# Patient Record
Sex: Female | Born: 1960 | Race: White | Hispanic: No | Marital: Single | Smoking: Former smoker
Health system: Southern US, Community
[De-identification: ages and names within clinical notes are randomized; demographics above are authoritative.]

## PROBLEM LIST (undated history)

## (undated) DIAGNOSIS — I5181 Takotsubo syndrome: Secondary | ICD-10-CM

## (undated) DIAGNOSIS — E119 Type 2 diabetes mellitus without complications: Secondary | ICD-10-CM

## (undated) DIAGNOSIS — F988 Other specified behavioral and emotional disorders with onset usually occurring in childhood and adolescence: Secondary | ICD-10-CM

## (undated) DIAGNOSIS — R0600 Dyspnea, unspecified: Secondary | ICD-10-CM

## (undated) DIAGNOSIS — F419 Anxiety disorder, unspecified: Secondary | ICD-10-CM

## (undated) DIAGNOSIS — R569 Unspecified convulsions: Secondary | ICD-10-CM

## (undated) DIAGNOSIS — M1990 Unspecified osteoarthritis, unspecified site: Secondary | ICD-10-CM

## (undated) DIAGNOSIS — F329 Major depressive disorder, single episode, unspecified: Secondary | ICD-10-CM

## (undated) DIAGNOSIS — J45909 Unspecified asthma, uncomplicated: Secondary | ICD-10-CM

## (undated) DIAGNOSIS — M810 Age-related osteoporosis without current pathological fracture: Secondary | ICD-10-CM

## (undated) DIAGNOSIS — G4730 Sleep apnea, unspecified: Secondary | ICD-10-CM

## (undated) DIAGNOSIS — J449 Chronic obstructive pulmonary disease, unspecified: Secondary | ICD-10-CM

## (undated) DIAGNOSIS — K219 Gastro-esophageal reflux disease without esophagitis: Secondary | ICD-10-CM

## (undated) DIAGNOSIS — N200 Calculus of kidney: Secondary | ICD-10-CM

## (undated) DIAGNOSIS — Z87442 Personal history of urinary calculi: Secondary | ICD-10-CM

## (undated) DIAGNOSIS — I10 Essential (primary) hypertension: Secondary | ICD-10-CM

## (undated) HISTORY — DX: Takotsubo syndrome: I51.81

## (undated) HISTORY — PX: KIDNEY STONE SURGERY: SHX686

## (undated) HISTORY — DX: Calculus of kidney: N20.0

## (undated) HISTORY — PX: FLEXOR TENDON REPAIR: SHX1651

---

## 2016-11-05 ENCOUNTER — Encounter: Payer: Self-pay | Admitting: Emergency Medicine

## 2016-11-05 ENCOUNTER — Emergency Department: Payer: Medicaid Other

## 2016-11-05 ENCOUNTER — Observation Stay
Admission: EM | Admit: 2016-11-05 | Discharge: 2016-11-07 | Disposition: A | Discharge status: Home / Self Care | Payer: Medicaid Other | Attending: Urology | Admitting: Urology

## 2016-11-05 DIAGNOSIS — N132 Hydronephrosis with renal and ureteral calculous obstruction: Secondary | ICD-10-CM | POA: Diagnosis not present

## 2016-11-05 DIAGNOSIS — F17210 Nicotine dependence, cigarettes, uncomplicated: Secondary | ICD-10-CM | POA: Diagnosis not present

## 2016-11-05 DIAGNOSIS — Z23 Encounter for immunization: Secondary | ICD-10-CM | POA: Insufficient documentation

## 2016-11-05 DIAGNOSIS — I700 Atherosclerosis of aorta: Secondary | ICD-10-CM | POA: Insufficient documentation

## 2016-11-05 DIAGNOSIS — N12 Tubulo-interstitial nephritis, not specified as acute or chronic: Secondary | ICD-10-CM

## 2016-11-05 DIAGNOSIS — J45909 Unspecified asthma, uncomplicated: Secondary | ICD-10-CM | POA: Insufficient documentation

## 2016-11-05 DIAGNOSIS — K219 Gastro-esophageal reflux disease without esophagitis: Secondary | ICD-10-CM | POA: Insufficient documentation

## 2016-11-05 DIAGNOSIS — Z7984 Long term (current) use of oral hypoglycemic drugs: Secondary | ICD-10-CM | POA: Diagnosis not present

## 2016-11-05 DIAGNOSIS — E119 Type 2 diabetes mellitus without complications: Secondary | ICD-10-CM | POA: Diagnosis not present

## 2016-11-05 DIAGNOSIS — F329 Major depressive disorder, single episode, unspecified: Secondary | ICD-10-CM | POA: Insufficient documentation

## 2016-11-05 DIAGNOSIS — K5730 Diverticulosis of large intestine without perforation or abscess without bleeding: Secondary | ICD-10-CM | POA: Diagnosis not present

## 2016-11-05 DIAGNOSIS — R569 Unspecified convulsions: Secondary | ICD-10-CM | POA: Diagnosis not present

## 2016-11-05 DIAGNOSIS — N200 Calculus of kidney: Secondary | ICD-10-CM

## 2016-11-05 DIAGNOSIS — N201 Calculus of ureter: Secondary | ICD-10-CM | POA: Diagnosis present

## 2016-11-05 HISTORY — DX: Major depressive disorder, single episode, unspecified: F32.9

## 2016-11-05 HISTORY — DX: Unspecified asthma, uncomplicated: J45.909

## 2016-11-05 HISTORY — DX: Unspecified convulsions: R56.9

## 2016-11-05 HISTORY — DX: Type 2 diabetes mellitus without complications: E11.9

## 2016-11-05 HISTORY — DX: Gastro-esophageal reflux disease without esophagitis: K21.9

## 2016-11-05 MED ORDER — MORPHINE SULFATE (PF) 4 MG/ML IV SOLN
INTRAVENOUS | Status: AC
  Administered 2016-11-05: 4 mg
  Filled 2016-11-05: qty 1

## 2016-11-05 MED ORDER — ONDANSETRON HCL 4 MG/2ML IJ SOLN
INTRAMUSCULAR | Status: AC
  Administered 2016-11-05: 4 mg
  Filled 2016-11-05: qty 2

## 2016-11-05 NOTE — ED Provider Notes (Signed)
[facility] Emergency Department Provider Note _   First MD Initiated Contact with Patient 11/05/16 2326     (approximate)  I have reviewed the triage vital signs and the nursing notes.   HISTORY  Chief Complaint Flank Pain (Pt. here via EMS from home for rt. sided flank pain.)    HPI Savannah Pena is a 55 y.o. female with history of kidney stones presents to the emergency department acute onset of right flank pain yesterday that she states has been persistent patient says her current pain score is 10 out of 10. Patient states the pain is coming by nausea however no vomiting. Patient admits to feeling febrile at home however afebrile here 98 8.  Past medical history Kidney stones Patient Active Problem List   Diagnosis Date Noted  . Ureteral stone 11/06/2016    Past surgical history None  Prior to Admission medications   Not on File    Allergies Patient has no known allergies.  History reviewed. No pertinent family history.  Social History Social History  Substance Use Topics  . Smoking status: Current Every Day Smoker    Types: Cigarettes  . Smokeless tobacco: Current User  . Alcohol use Yes    Review of Systems Constitutional: No fever/chills Eyes: No visual changes. ENT: No sore throat. Cardiovascular: Denies chest pain. Respiratory: Denies shortness of breath. Gastrointestinal: No abdominal pain.  No nausea, no vomiting.  No diarrhea.  No constipation. Genitourinary: Negative for dysuria. Musculoskeletal: Negative for back pain. Skin: Negative for rash. Neurological: Negative for headaches, focal weakness or numbness.  10-point ROS otherwise negative.  ____________________________________________   PHYSICAL EXAM:  VITAL SIGNS: ED Triage Vitals  Enc Vitals Group     BP --      Pulse Rate 11/05/16 2328 (!) 108     Resp --      Temp --      Temp src --      SpO2 11/05/16 2326 97 %     Weight --      Height --    Head Circumference --      Peak Flow --      Pain Score --      Pain Loc --      Pain Edu? --      Excl. in GC? --     Constitutional: Alert and oriented. Apparent discomfort  Eyes: Conjunctivae are normal. PERRL. EOMI. Head: Atraumatic. Ears:  Healthy appearing ear canals and TMs bilaterally Nose: No congestion/rhinnorhea. Mouth/Throat: Mucous membranes are moist.  Oropharynx non-erythematous. Neck: No stridor.   Cardiovascular: Normal rate, regular rhythm. Good peripheral circulation. Grossly normal heart sounds. Respiratory: Normal respiratory effort.  No retractions. Lungs CTAB. Gastrointestinal: Soft and nontender. No distention.  Genitourinary: Right CVA tenderness with percussion Musculoskeletal: No lower extremity tenderness nor edema. No gross deformities of extremities. Neurologic:  Normal speech and language. No gross focal neurologic deficits are appreciated.  Skin:  Skin is warm, dry and intact. No rash noted. Psychiatric: Mood and affect are normal. Speech and behavior are normal.  ____________________________________________   LABS (all labs ordered are listed, but only abnormal results are displayed)  Labs Reviewed  CBC - Abnormal; Notable for the following:       Result Value   WBC 28.3 (*)    All other components within normal limits  COMPREHENSIVE METABOLIC PANEL - Abnormal; Notable for the following:    Glucose, Bld 159 (*)    BUN 25 (*)  All other components within normal limits  URINE CULTURE  CULTURE, BLOOD (ROUTINE X 2)  CULTURE, BLOOD (ROUTINE X 2)  CREATININE, SERUM  URINALYSIS, COMPLETE (UACMP) WITH MICROSCOPIC  LACTIC ACID, PLASMA  LACTIC ACID, PLASMA  HEMOGLOBIN A1C  CBC    RADIOLOGY I, [place] N BROWN, personally viewed and evaluated these images (plain radiographs) as part of my medical decision making, as well as reviewing the written report by the radiologist.  Ct Renal Stone Study  Result Date: 11/06/2016 CLINICAL DATA:   Acute onset of right flank pain and nausea. Initial encounter. EXAM: CT ABDOMEN AND PELVIS WITHOUT CONTRAST TECHNIQUE: Multidetector CT imaging of the abdomen and pelvis was performed following the standard protocol without IV contrast. COMPARISON:  None. FINDINGS: Lower chest: The visualized lung bases are grossly clear. The visualized portions of the mediastinum are unremarkable. Hepatobiliary: The liver is unremarkable in appearance. The gallbladder is unremarkable in appearance. The common bile duct remains normal in caliber. Pancreas: The pancreas is within normal limits. Spleen: The spleen is unremarkable in appearance. Adrenals/Urinary Tract: The adrenal glands are unremarkable in appearance. Moderate right-sided hydronephrosis is noted, with right-sided perinephric stranding and fluid. An obstructing 1.0 x 0.5 cm stone is noted at the mid right ureter, 8 cm below the right renal pelvis. A small nonobstructing 5 mm stone is suggested at the interpole region of the right kidney. Stomach/Bowel: The stomach is unremarkable in appearance. The small bowel is within normal limits. The appendix is not visualized; there is no evidence for appendicitis. Mild scattered diverticulosis is noted along the entirety of the colon, without evidence of diverticulitis. Vascular/Lymphatic: Scattered calcification is seen along the abdominal aorta and its branches. The abdominal aorta is otherwise grossly unremarkable. The inferior vena cava is grossly unremarkable. No retroperitoneal lymphadenopathy is seen. No pelvic sidewall lymphadenopathy is identified. Reproductive: The bladder is mildly distended and within normal limits. The uterus is grossly unremarkable in appearance. The ovaries are relatively symmetric. No suspicious adnexal masses are seen. Other: No additional soft tissue abnormalities are seen. Musculoskeletal: No acute osseous abnormalities are identified. The visualized musculature is unremarkable in appearance.  IMPRESSION: 1. Moderate right-sided hydronephrosis, with right-sided perinephric stranding and fluid. Obstructing 1.0 x 0.5 cm stone at the mid right ureter, 8 cm below the right renal pelvis. 2. Small nonobstructing 5 mm stone suggested at the interpole region of the right kidney. 3. Mild scattered diverticulosis along the entirety of the colon, without evidence of diverticulitis. 4. Scattered aortic atherosclerosis. Electronically Signed   By: Roanna Raider M.D.   On: 11/06/2016 00:13     Procedures   Critical Care performed:CRITICAL CARE Performed by: Darci Current   Total critical care time: 30 minutes  Critical care time was exclusive of separately billable procedures and treating other patients.  Critical care was necessary to treat or prevent imminent or life-threatening deterioration.  Critical care was time spent personally by me on the following activities: development of treatment plan with patient and/or surrogate as well as nursing, discussions with consultants, evaluation of patient's response to treatment, examination of patient, obtaining history from patient or surrogate, ordering and performing treatments and interventions, ordering and review of laboratory studies, ordering and review of radiographic studies, pulse oximetry and re-evaluation of patient's condition.  ____________________________________________   INITIAL IMPRESSION / ASSESSMENT AND PLAN / ED COURSE  Pertinent labs & imaging results that were available during my care of the patient were reviewed by me and considered in my medical decision making (see  chart for details).  Patient given IV morphine and Zofran on initial evaluation. History of physical exam concerning for possible ureterolithiasis which was confirmed by CT scan. In addition concern for possible pyelonephritis concomitant ureterolithiasis. Patient's WBC 28 patient with reported fever at home tachycardic on arrival as such patient was given  IV ceftriaxone 1 g. Patient discussed with Dr. Wilson SingerWren urologist on call with plan for urethral stent. Patient also discussed with Dr. Sheryle Haildiamond hospitalist for admission following procedure. Clinical Course     ____________________________________________  FINAL CLINICAL IMPRESSION(S) / ED DIAGNOSES  Final diagnoses:  None     MEDICATIONS GIVEN DURING THIS VISIT:  Medications  insulin aspart (novoLOG) injection 0-15 Units (not administered)  enoxaparin (LOVENOX) injection 40 mg (not administered)  0.45 % NaCl with KCl 20 mEq / L infusion ( Intravenous New Bag/Given 11/06/16 0433)  cefTRIAXone (ROCEPHIN) 1 g in dextrose 5 % 50 mL IVPB (not administered)  acetaminophen (TYLENOL) tablet 650 mg (not administered)  oxyCODONE (Oxy IR/ROXICODONE) immediate release tablet 5 mg (not administered)  HYDROmorphone (DILAUDID) injection 0.5-1 mg (not administered)  oxybutynin (DITROPAN) tablet 5 mg (not administered)  zolpidem (AMBIEN) tablet 5 mg (not administered)  diphenhydrAMINE (BENADRYL) injection 12.5-25 mg (not administered)    Or  diphenhydrAMINE (BENADRYL) 12.5 MG/5ML elixir 12.5-25 mg (not administered)  senna-docusate (Senokot-S) tablet 1 tablet (not administered)  bisacodyl (DULCOLAX) suppository 10 mg (not administered)  sodium phosphate (FLEET) 7-19 GM/118ML enema 1 enema (not administered)  ondansetron (ZOFRAN) injection 4 mg (not administered)  pneumococcal 23 valent vaccine (PNU-IMMUNE) injection 0.5 mL (not administered)  ondansetron (ZOFRAN) 4 MG/2ML injection (4 mg  Given 11/05/16 2342)  morphine 4 MG/ML injection (4 mg  Given 11/05/16 2342)  morphine 4 MG/ML injection 4 mg (4 mg Intravenous Given 11/06/16 0035)  cefTRIAXone (ROCEPHIN) IVPB 1 g (1 g Intravenous Given 11/06/16 0220)  sodium chloride 0.9 % bolus 1,000 mL (1,000 mLs Intravenous Transfusing/Transfer 11/06/16 0145)     NEW OUTPATIENT MEDICATIONS STARTED DURING THIS VISIT:  There are no discharge medications  for this patient.   There are no discharge medications for this patient.   There are no discharge medications for this patient.    Note:  This document was prepared using Dragon voice recognition software and may include unintentional dictation errors.    Darci Currentandolph N Brown, MD 11/06/16 [phone]

## 2016-11-05 NOTE — ED Triage Notes (Signed)
Pt. States rt. Sided flank pain that started last night.

## 2016-11-05 NOTE — ED Notes (Signed)
Patient transported to CT 

## 2016-11-06 ENCOUNTER — Encounter
Admission: EM | Disposition: A | Discharge status: Home / Self Care | Payer: Self-pay | Source: Home / Self Care | Attending: Emergency Medicine

## 2016-11-06 ENCOUNTER — Encounter: Payer: Self-pay | Admitting: Anesthesiology

## 2016-11-06 ENCOUNTER — Emergency Department: Payer: Medicaid Other | Admitting: Anesthesiology

## 2016-11-06 DIAGNOSIS — N201 Calculus of ureter: Secondary | ICD-10-CM | POA: Diagnosis not present

## 2016-11-06 DIAGNOSIS — A419 Sepsis, unspecified organism: Secondary | ICD-10-CM | POA: Diagnosis not present

## 2016-11-06 HISTORY — PX: CYSTOSCOPY WITH STENT PLACEMENT: SHX5790

## 2016-11-06 LAB — CBC
HCT: 41.2 % (ref 35.0–47.0)
HEMATOCRIT: 41.8 % (ref 35.0–47.0)
HEMOGLOBIN: 14.5 g/dL (ref 12.0–16.0)
Hemoglobin: 13.9 g/dL (ref 12.0–16.0)
MCH: 30.7 pg (ref 26.0–34.0)
MCH: 30.9 pg (ref 26.0–34.0)
MCHC: 33.8 g/dL (ref 32.0–36.0)
MCHC: 34.6 g/dL (ref 32.0–36.0)
MCV: 89.2 fL (ref 80.0–100.0)
MCV: 90.9 fL (ref 80.0–100.0)
PLATELETS: 226 10*3/uL (ref 150–440)
Platelets: 222 10*3/uL (ref 150–440)
RBC: 4.53 MIL/uL (ref 3.80–5.20)
RBC: 4.68 MIL/uL (ref 3.80–5.20)
RDW: 13 % (ref 11.5–14.5)
RDW: 13.2 % (ref 11.5–14.5)
WBC: 28.3 10*3/uL — ABNORMAL HIGH (ref 3.6–11.0)
WBC: 28.5 10*3/uL — ABNORMAL HIGH (ref 3.6–11.0)

## 2016-11-06 LAB — CREATININE, SERUM
Creatinine, Ser: 0.78 mg/dL (ref 0.44–1.00)
GFR calc Af Amer: >60 mL/min (ref >60)
GFR calc non Af Amer: >60 mL/min (ref >60)

## 2016-11-06 LAB — URINALYSIS, COMPLETE (UACMP) WITH MICROSCOPIC
BACTERIA UA: NONE SEEN
Bilirubin Urine: NEGATIVE
GLUCOSE, UA: NEGATIVE mg/dL
Ketones, ur: NEGATIVE mg/dL
NITRITE: POSITIVE — AB
PROTEIN: 100 mg/dL — AB
SPECIFIC GRAVITY, URINE: 1.012 (ref 1.005–1.030)
pH: 6 (ref 5.0–8.0)

## 2016-11-06 LAB — COMPREHENSIVE METABOLIC PANEL
ALK PHOS: 95 U/L (ref 38–126)
ALT: 26 U/L (ref 14–54)
AST: 26 U/L (ref 15–41)
Albumin: 3.9 g/dL (ref 3.5–5.0)
Anion gap: 11 (ref 5–15)
BILIRUBIN TOTAL: 0.7 mg/dL (ref 0.3–1.2)
BUN: 25 mg/dL — ABNORMAL HIGH (ref 6–20)
CALCIUM: 8.9 mg/dL (ref 8.9–10.3)
CO2: 22 mmol/L (ref 22–32)
CREATININE: 0.96 mg/dL (ref 0.44–1.00)
Chloride: 105 mmol/L (ref 101–111)
GFR calc non Af Amer: >60 mL/min (ref >60)
Glucose, Bld: 159 mg/dL — ABNORMAL HIGH (ref 65–99)
Potassium: 3.8 mmol/L (ref 3.5–5.1)
SODIUM: 138 mmol/L (ref 135–145)
TOTAL PROTEIN: 6.9 g/dL (ref 6.5–8.1)

## 2016-11-06 LAB — LACTIC ACID, PLASMA
Lactic Acid, Venous: 2.6 mmol/L (ref 0.5–1.9)
Lactic Acid, Venous: 1.6 mmol/L (ref 0.5–1.9)

## 2016-11-06 LAB — GLUCOSE, CAPILLARY
GLUCOSE-CAPILLARY: 120 mg/dL — AB (ref 65–99)
GLUCOSE-CAPILLARY: 117 mg/dL — AB (ref 65–99)
Glucose-Capillary: 192 mg/dL — ABNORMAL HIGH (ref 65–99)
Glucose-Capillary: 123 mg/dL — ABNORMAL HIGH (ref 65–99)

## 2016-11-06 SURGERY — CYSTOSCOPY, WITH STENT INSERTION
Anesthesia: Choice | Laterality: Right

## 2016-11-06 SURGERY — CYSTOSCOPY, WITH STENT INSERTION
Anesthesia: General | Site: Ureter | Laterality: Right | Wound class: Clean Contaminated

## 2016-11-06 MED ORDER — FENTANYL CITRATE (PF) 100 MCG/2ML IJ SOLN
INTRAMUSCULAR | Status: DC | PRN
  Administered 2016-11-06 (×2): 50 ug via INTRAVENOUS

## 2016-11-06 MED ORDER — DEXTROSE 5 % IV SOLN
1.0000 g | INTRAVENOUS | Status: DC
  Administered 2016-11-06 – 2016-11-07 (×2): 1 g via INTRAVENOUS
  Filled 2016-11-06 (×2): qty 10

## 2016-11-06 MED ORDER — CEFTRIAXONE SODIUM-DEXTROSE 1-3.74 GM-% IV SOLR
1.0000 g | Freq: Once | INTRAVENOUS | Status: AC
  Administered 2016-11-06: 1 g via INTRAVENOUS
  Filled 2016-11-06: qty 50

## 2016-11-06 MED ORDER — PNEUMOCOCCAL VAC POLYVALENT 25 MCG/0.5ML IJ INJ
0.5000 mL | INJECTION | INTRAMUSCULAR | Status: AC
  Administered 2016-11-07: 0.5 mL via INTRAMUSCULAR
  Filled 2016-11-06: qty 0.5

## 2016-11-06 MED ORDER — LISINOPRIL 20 MG PO TABS
20.0000 mg | ORAL_TABLET | Freq: Every day | ORAL | Status: DC
  Administered 2016-11-06 – 2016-11-07 (×2): 20 mg via ORAL
  Filled 2016-11-06 (×2): qty 1

## 2016-11-06 MED ORDER — PHENYTOIN SODIUM EXTENDED 100 MG PO CAPS
300.0000 mg | ORAL_CAPSULE | Freq: Every day | ORAL | Status: DC
  Administered 2016-11-06 – 2016-11-07 (×2): 300 mg via ORAL
  Filled 2016-11-06 (×2): qty 3

## 2016-11-06 MED ORDER — ONDANSETRON HCL 4 MG/2ML IJ SOLN: 4.0000 mg | INTRAMUSCULAR | Status: DC | PRN

## 2016-11-06 MED ORDER — SUCCINYLCHOLINE CHLORIDE 20 MG/ML IJ SOLN
INTRAMUSCULAR | Status: DC | PRN
  Administered 2016-11-06: 100 mg via INTRAVENOUS

## 2016-11-06 MED ORDER — PROPOFOL 10 MG/ML IV BOLUS
INTRAVENOUS | Status: DC | PRN
  Administered 2016-11-06: 170 mg via INTRAVENOUS

## 2016-11-06 MED ORDER — SODIUM CHLORIDE 0.9 % IV BOLUS (SEPSIS)
1000.0000 mL | Freq: Once | INTRAVENOUS | Status: AC
  Administered 2016-11-06: 1000 mL via INTRAVENOUS

## 2016-11-06 MED ORDER — NAPROXEN 500 MG PO TABS
500.0000 mg | ORAL_TABLET | Freq: Two times a day (BID) | ORAL | Status: DC
  Administered 2016-11-06 – 2016-11-07 (×2): 500 mg via ORAL
  Filled 2016-11-06 (×2): qty 1

## 2016-11-06 MED ORDER — FLUOXETINE HCL 20 MG PO CAPS
20.0000 mg | ORAL_CAPSULE | Freq: Every day | ORAL | Status: DC
  Administered 2016-11-06 – 2016-11-07 (×2): 20 mg via ORAL
  Filled 2016-11-06 (×2): qty 1

## 2016-11-06 MED ORDER — LORATADINE 10 MG PO TABS
10.0000 mg | ORAL_TABLET | Freq: Every day | ORAL | Status: DC
  Administered 2016-11-07: 10 mg via ORAL
  Filled 2016-11-06: qty 1

## 2016-11-06 MED ORDER — FLEET ENEMA 7-19 GM/118ML RE ENEM: 1.0000 | ENEMA | Freq: Once | RECTAL | Status: DC | PRN

## 2016-11-06 MED ORDER — ACETAMINOPHEN 325 MG PO TABS
650.0000 mg | ORAL_TABLET | ORAL | Status: DC | PRN
  Administered 2016-11-06: 650 mg via ORAL
  Filled 2016-11-06: qty 2

## 2016-11-06 MED ORDER — BISACODYL 10 MG RE SUPP: 10.0000 mg | Freq: Every day | RECTAL | Status: DC | PRN

## 2016-11-06 MED ORDER — METFORMIN HCL 500 MG PO TABS
500.0000 mg | ORAL_TABLET | Freq: Two times a day (BID) | ORAL | Status: DC
  Administered 2016-11-06 – 2016-11-07 (×2): 500 mg via ORAL
  Filled 2016-11-06 (×2): qty 1

## 2016-11-06 MED ORDER — ONDANSETRON HCL 4 MG/2ML IJ SOLN: 4.0000 mg | Freq: Once | INTRAMUSCULAR | Status: DC | PRN

## 2016-11-06 MED ORDER — SENNOSIDES-DOCUSATE SODIUM 8.6-50 MG PO TABS: 1.0000 | ORAL_TABLET | Freq: Every evening | ORAL | Status: DC | PRN

## 2016-11-06 MED ORDER — POTASSIUM CHLORIDE IN NACL 20-0.45 MEQ/L-% IV SOLN
INTRAVENOUS | Status: DC
  Administered 2016-11-06: 1000 mL via INTRAVENOUS
  Administered 2016-11-06 – 2016-11-07 (×2): via INTRAVENOUS
  Filled 2016-11-06 (×5): qty 1000

## 2016-11-06 MED ORDER — ONDANSETRON HCL 4 MG/2ML IJ SOLN
INTRAMUSCULAR | Status: AC
  Filled 2016-11-06: qty 2

## 2016-11-06 MED ORDER — DEXTROSE 5 % IV SOLN: 1.0000 g | Freq: Once | INTRAVENOUS | Status: DC

## 2016-11-06 MED ORDER — OXYCODONE HCL 5 MG PO TABS
5.0000 mg | ORAL_TABLET | ORAL | Status: DC | PRN
  Administered 2016-11-06 (×2): 5 mg via ORAL
  Filled 2016-11-06 (×2): qty 1

## 2016-11-06 MED ORDER — PANTOPRAZOLE SODIUM 40 MG PO TBEC
40.0000 mg | DELAYED_RELEASE_TABLET | Freq: Every day | ORAL | Status: DC
  Administered 2016-11-06 – 2016-11-07 (×2): 40 mg via ORAL
  Filled 2016-11-06 (×2): qty 1

## 2016-11-06 MED ORDER — MORPHINE SULFATE (PF) 4 MG/ML IV SOLN
4.0000 mg | Freq: Once | INTRAVENOUS | Status: AC
  Administered 2016-11-06: 4 mg via INTRAVENOUS

## 2016-11-06 MED ORDER — AMPHETAMINE-DEXTROAMPHET ER 30 MG PO CP24
30.0000 mg | ORAL_CAPSULE | Freq: Every day | ORAL | Status: DC
  Administered 2016-11-07: 30 mg via ORAL
  Filled 2016-11-06: qty 1

## 2016-11-06 MED ORDER — ALBUTEROL SULFATE (2.5 MG/3ML) 0.083% IN NEBU
3.0000 mL | INHALATION_SOLUTION | RESPIRATORY_TRACT | Status: DC | PRN
  Administered 2016-11-06: 3 mL via RESPIRATORY_TRACT
  Filled 2016-11-06: qty 3

## 2016-11-06 MED ORDER — FENTANYL CITRATE (PF) 100 MCG/2ML IJ SOLN
INTRAMUSCULAR | Status: AC
  Filled 2016-11-06: qty 2

## 2016-11-06 MED ORDER — DIPHENHYDRAMINE HCL 25 MG PO CAPS: 25.0000 mg | ORAL_CAPSULE | Freq: Four times a day (QID) | ORAL | Status: DC | PRN

## 2016-11-06 MED ORDER — HYDROMORPHONE HCL 1 MG/ML IJ SOLN
0.5000 mg | INTRAMUSCULAR | Status: DC | PRN
Start: 2016-11-06 — End: 2016-11-07

## 2016-11-06 MED ORDER — MORPHINE SULFATE (PF) 4 MG/ML IV SOLN
INTRAVENOUS | Status: AC
  Administered 2016-11-06: 4 mg via INTRAVENOUS
  Filled 2016-11-06: qty 1

## 2016-11-06 MED ORDER — ZOLPIDEM TARTRATE 5 MG PO TABS: 5.0000 mg | ORAL_TABLET | Freq: Every evening | ORAL | Status: DC | PRN

## 2016-11-06 MED ORDER — DIPHENHYDRAMINE HCL 12.5 MG/5ML PO ELIX: 12.5000 mg | ORAL_SOLUTION | Freq: Four times a day (QID) | ORAL | Status: DC | PRN

## 2016-11-06 MED ORDER — FENTANYL CITRATE (PF) 100 MCG/2ML IJ SOLN: 25.0000 ug | INTRAMUSCULAR | Status: DC | PRN

## 2016-11-06 MED ORDER — SODIUM CHLORIDE 0.9 % IV SOLN
INTRAVENOUS | Status: DC | PRN
  Administered 2016-11-06: 02:00:00 via INTRAVENOUS

## 2016-11-06 MED ORDER — ALBUTEROL SULFATE HFA 108 (90 BASE) MCG/ACT IN AERS
INHALATION_SPRAY | RESPIRATORY_TRACT | Status: AC
  Filled 2016-11-06: qty 6.7

## 2016-11-06 MED ORDER — LIDOCAINE HCL (CARDIAC) 20 MG/ML IV SOLN
INTRAVENOUS | Status: DC | PRN
  Administered 2016-11-06: 30 mg via INTRAVENOUS

## 2016-11-06 MED ORDER — ALBUTEROL SULFATE (2.5 MG/3ML) 0.083% IN NEBU: 2.5000 mg | INHALATION_SOLUTION | Freq: Four times a day (QID) | RESPIRATORY_TRACT | Status: DC | PRN

## 2016-11-06 MED ORDER — DEXTROSE 5 % IV SOLN: INTRAVENOUS | Status: DC | PRN

## 2016-11-06 MED ORDER — DIPHENHYDRAMINE HCL 50 MG/ML IJ SOLN: 12.5000 mg | Freq: Four times a day (QID) | INTRAMUSCULAR | Status: DC | PRN

## 2016-11-06 MED ORDER — GABAPENTIN 300 MG PO CAPS
300.0000 mg | ORAL_CAPSULE | Freq: Two times a day (BID) | ORAL | Status: DC
  Administered 2016-11-06 – 2016-11-07 (×2): 300 mg via ORAL
  Filled 2016-11-06 (×2): qty 1

## 2016-11-06 MED ORDER — OCUVITE-LUTEIN PO CAPS
1.0000 | ORAL_CAPSULE | Freq: Every day | ORAL | Status: DC
  Administered 2016-11-07: 1 via ORAL
  Filled 2016-11-06: qty 1

## 2016-11-06 MED ORDER — LIDOCAINE 2% (20 MG/ML) 5 ML SYRINGE
INTRAMUSCULAR | Status: AC
  Filled 2016-11-06: qty 5

## 2016-11-06 MED ORDER — SIMVASTATIN 40 MG PO TABS
40.0000 mg | ORAL_TABLET | Freq: Every day | ORAL | Status: DC
  Administered 2016-11-06 – 2016-11-07 (×2): 40 mg via ORAL
  Filled 2016-11-06 (×2): qty 1

## 2016-11-06 MED ORDER — OXYBUTYNIN CHLORIDE 5 MG PO TABS: 5.0000 mg | ORAL_TABLET | Freq: Three times a day (TID) | ORAL | Status: DC | PRN

## 2016-11-06 MED ORDER — SUCCINYLCHOLINE CHLORIDE 200 MG/10ML IV SOSY
PREFILLED_SYRINGE | INTRAVENOUS | Status: AC
  Filled 2016-11-06: qty 10

## 2016-11-06 MED ORDER — PROPOFOL 10 MG/ML IV BOLUS
INTRAVENOUS | Status: AC
  Filled 2016-11-06: qty 20

## 2016-11-06 MED ORDER — ONDANSETRON HCL 4 MG/2ML IJ SOLN
INTRAMUSCULAR | Status: DC | PRN
  Administered 2016-11-06: 4 mg via INTRAVENOUS

## 2016-11-06 MED ORDER — ENOXAPARIN SODIUM 40 MG/0.4ML ~~LOC~~ SOLN
40.0000 mg | SUBCUTANEOUS | Status: DC
  Administered 2016-11-06: 40 mg via SUBCUTANEOUS
  Filled 2016-11-06: qty 0.4

## 2016-11-06 MED ORDER — INSULIN ASPART 100 UNIT/ML ~~LOC~~ SOLN
0.0000 [IU] | Freq: Three times a day (TID) | SUBCUTANEOUS | Status: DC
  Administered 2016-11-06 – 2016-11-07 (×2): 3 [IU] via SUBCUTANEOUS
  Filled 2016-11-06 (×2): qty 3

## 2016-11-06 MED ORDER — FLUTICASONE FUROATE-VILANTEROL 200-25 MCG/INH IN AEPB
1.0000 | INHALATION_SPRAY | Freq: Every day | RESPIRATORY_TRACT | Status: DC
  Administered 2016-11-06 – 2016-11-07 (×2): 1 via RESPIRATORY_TRACT
  Filled 2016-11-06: qty 28

## 2016-11-06 MED ORDER — MELOXICAM 7.5 MG PO TABS
7.5000 mg | ORAL_TABLET | Freq: Two times a day (BID) | ORAL | Status: DC
  Administered 2016-11-06 – 2016-11-07 (×2): 7.5 mg via ORAL
  Filled 2016-11-06 (×2): qty 1

## 2016-11-06 SURGICAL SUPPLY — 18 items
BAG DRAIN CYSTO-URO LG1000N (MISCELLANEOUS) ×3 IMPLANT
CANISTER SUCT LVC 12 LTR MEDI- (MISCELLANEOUS) ×3 IMPLANT
DRAPE XRAY CASSETTE 23X24 (DRAPES) ×3 IMPLANT
GLOVE BIO SURGEON STRL SZ8 (GLOVE) ×3 IMPLANT
GOWN STRL REUS W/ TWL LRG LVL4 (GOWN DISPOSABLE) ×1 IMPLANT
GOWN STRL REUS W/ TWL XL LVL3 (GOWN DISPOSABLE) ×1 IMPLANT
GOWN STRL REUS W/TWL LRG LVL4 (GOWN DISPOSABLE) ×2
GOWN STRL REUS W/TWL XL LVL3 (GOWN DISPOSABLE) ×2
GUIDEWIRE STR ZIPWIRE 035X150 (MISCELLANEOUS) ×3 IMPLANT
KIT RM TURNOVER CYSTO AR (KITS) ×3 IMPLANT
NS IRRIG 500ML POUR BTL (IV SOLUTION) ×3 IMPLANT
PACK CYSTO AR (MISCELLANEOUS) ×3 IMPLANT
PREP PVP WINGED SPONGE (MISCELLANEOUS) ×3 IMPLANT
SET CYSTO W/LG BORE CLAMP LF (SET/KITS/TRAYS/PACK) ×3 IMPLANT
SOL .9 NS 3000ML IRR  AL (IV SOLUTION) ×2
SOL .9 NS 3000ML IRR UROMATIC (IV SOLUTION) ×1 IMPLANT
STENT SFT UR WO WIRE 5FRX24CM (STENTS) ×3 IMPLANT
WATER STERILE IRR 1000ML POUR (IV SOLUTION) ×3 IMPLANT

## 2016-11-06 NOTE — H&P (Signed)
Subjective: Savannah Pena is a 55 yo WF who I was asked to see in consultation by Dr. Manson Passey for a 5 x 10mm right proximal ureteral stone with obstruction and sepsis.    She has a history of stones with 2 prior stents and lithotripsies.  She had the onset yesterday at 6pm of severe right flank pain with chills and nausea.   She has had no hematuria or dysuria.   Her WBC is 28K and the urine is pending.    ROS:  Review of Systems  Constitutional: Positive for chills and fever.  Respiratory: Positive for shortness of breath.   Gastrointestinal: Positive for abdominal pain, nausea and vomiting.  All other systems reviewed and are negative.   No Known Allergies  Past Medical History:  Diagnosis Date  . Asthma   . Diabetes mellitus without complication (HCC)   . GERD (gastroesophageal reflux disease)   . Major depression   . Seizures (HCC)     Past Surgical History:  Procedure Laterality Date  . CESAREAN SECTION    . KIDNEY STONE SURGERY      Social History   Social History  . Marital status: Single    Spouse name: N/A  . Number of children: N/A  . Years of education: N/A   Occupational History  . Not on file.   Social History Main Topics  . Smoking status: Current Every Day Smoker    Types: Cigarettes  . Smokeless tobacco: Not on file  . Alcohol use Yes  . Drug use: No  . Sexual activity: Not on file   Other Topics Concern  . Not on file   Social History Narrative  . No narrative on file    No family history on file.  Anti-infectives: Anti-infectives    Start     Dose/Rate Route Frequency Ordered Stop   11/06/16 0045  cefTRIAXone (ROCEPHIN) IVPB 1 g     1 g 100 mL/hr over 30 Minutes Intravenous  Once 11/06/16 0033     11/06/16 0030  cefTRIAXone (ROCEPHIN) 1 g in dextrose 5 % 50 mL IVPB  Status:  Discontinued     1 g 100 mL/hr over 30 Minutes Intravenous  Once 11/06/16 0029 11/06/16 0033      Current Facility-Administered Medications  Medication Dose Route  Frequency Provider Last Rate Last Dose  . cefTRIAXone (ROCEPHIN) IVPB 1 g  1 g Intravenous Once Darci Current, MD       No current outpatient prescriptions on file.   She is on Metformin, an inhaler and antihypertensive but can't recall the remainder of her meds.   She denies blood thinners.  Past medical, surgical, social and family history reviewed.   Objective: Vital signs in last 24 hours: Temp:  [98.9 F (37.2 C)] 98.9 F (37.2 C) (12/28 0100) Pulse Rate:  [103-108] 103 (12/28 0038) Resp:  [18] 18 (12/28 0038) BP: (124)/(65) 124/65 (12/28 0038) SpO2:  [95 %-98 %] 95 % (12/28 0038) Weight:  [108.9 kg (240 lb)] 108.9 kg (240 lb) (12/27 2329)  Intake/Output from previous day: No intake/output data recorded. Intake/Output this shift: No intake/output data recorded.   Physical Exam  Constitutional: She is oriented to person, place, and time and well-developed, well-nourished, and in no distress.  HENT:  Head: Normocephalic and atraumatic.  Neck: Normal range of motion. Neck supple. No thyromegaly present.  Cardiovascular:  Sinus tach without murmur  Pulmonary/Chest: Effort normal and breath sounds normal. No respiratory distress.  Abdominal:  Soft, obese  with right CVAT and RLQ tenderness.  No mass,hsm or hernias.  Musculoskeletal: Normal range of motion. She exhibits no edema or tenderness.  Lymphadenopathy:    She has no cervical adenopathy.    She has no axillary adenopathy.       Right: No supraclavicular adenopathy present.       Left: No supraclavicular adenopathy present.  Neurological: She is alert and oriented to person, place, and time.  Skin: Skin is warm and dry. No erythema.  Psychiatric: Affect normal.    Lab Results:   Recent Labs  11/05/16 2335  WBC 28.3*  HGB 14.5  HCT 41.8  PLT 222   BMET  Recent Labs  11/05/16 2335  NA 138  K 3.8  CL 105  CO2 22  GLUCOSE 159*  BUN 25*  CREATININE 0.96  CALCIUM 8.9   PT/INR No results for  input(s): LABPROT, INR in the last 72 hours. ABG No results for input(s): PHART, HCO3 in the last 72 hours.  Invalid input(s): PCO2, PO2  Studies/Results: Ct Renal Stone Study  Result Date: 11/06/2016 CLINICAL DATA:  Acute onset of right flank pain and nausea. Initial encounter. EXAM: CT ABDOMEN AND PELVIS WITHOUT CONTRAST TECHNIQUE: Multidetector CT imaging of the abdomen and pelvis was performed following the standard protocol without IV contrast. COMPARISON:  None. FINDINGS: Lower chest: The visualized lung bases are grossly clear. The visualized portions of the mediastinum are unremarkable. Hepatobiliary: The liver is unremarkable in appearance. The gallbladder is unremarkable in appearance. The common bile duct remains normal in caliber. Pancreas: The pancreas is within normal limits. Spleen: The spleen is unremarkable in appearance. Adrenals/Urinary Tract: The adrenal glands are unremarkable in appearance. Moderate right-sided hydronephrosis is noted, with right-sided perinephric stranding and fluid. An obstructing 1.0 x 0.5 cm stone is noted at the mid right ureter, 8 cm below the right renal pelvis. A small nonobstructing 5 mm stone is suggested at the interpole region of the right kidney. Stomach/Bowel: The stomach is unremarkable in appearance. The small bowel is within normal limits. The appendix is not visualized; there is no evidence for appendicitis. Mild scattered diverticulosis is noted along the entirety of the colon, without evidence of diverticulitis. Vascular/Lymphatic: Scattered calcification is seen along the abdominal aorta and its branches. The abdominal aorta is otherwise grossly unremarkable. The inferior vena cava is grossly unremarkable. No retroperitoneal lymphadenopathy is seen. No pelvic sidewall lymphadenopathy is identified. Reproductive: The bladder is mildly distended and within normal limits. The uterus is grossly unremarkable in appearance. The ovaries are relatively  symmetric. No suspicious adnexal masses are seen. Other: No additional soft tissue abnormalities are seen. Musculoskeletal: No acute osseous abnormalities are identified. The visualized musculature is unremarkable in appearance. IMPRESSION: 1. Moderate right-sided hydronephrosis, with right-sided perinephric stranding and fluid. Obstructing 1.0 x 0.5 cm stone at the mid right ureter, 8 cm below the right renal pelvis. 2. Small nonobstructing 5 mm stone suggested at the interpole region of the right kidney. 3. Mild scattered diverticulosis along the entirety of the colon, without evidence of diverticulitis. 4. Scattered aortic atherosclerosis. Electronically Signed   By: Roanna RaiderJeffery  Chang M.D.   On: 11/06/2016 00:13     Assessment: Right proximal ureteral stone with sepsis of urinary origin.   I am going to take her to the OR for cystoscopy with right ureteral stenting.  I reviewed the risks of bleeding, infection, ureteral injury, need for a perc tube, need for secondary procedures, thrombotic events and anesthesia complications.   She  will be admitted to the medical service for management of the sepsis and concomitant medical conditions.    CC: Dr.[place] Justice Deeds J 11/06/2016 [phone]

## 2016-11-06 NOTE — Brief Op Note (Signed)
11/05/2016 - 11/06/2016  2:42 AM  PATIENT:  [name]  55 y.o. female  PRE-OPERATIVE DIAGNOSIS:  kidney stone  POST-OPERATIVE DIAGNOSIS:  kidney stone  PROCEDURE:  Procedure(s): CYSTOSCOPY WITH STENT PLACEMENT (Right)  SURGEON:  Surgeon(s) and Role:    * Bjorn Pippin, MD - Primary  PHYSICIAN ASSISTANT:   ASSISTANTS: none   ANESTHESIA:   general  EBL:  No intake/output data recorded.  BLOOD ADMINISTERED:none  DRAINS: right 6 x 24 JJ stent   LOCAL MEDICATIONS USED:  NONE  SPECIMEN:  No Specimen  DISPOSITION OF SPECIMEN:  N/A  COUNTS:  YES  TOURNIQUET:  * No tourniquets in log *  DICTATION: .Other Dictation: Dictation Number [id number]  PLAN OF CARE: Admit for overnight observation  PATIENT DISPOSITION:  PACU - hemodynamically stable.   Delay start of Pharmacological VTE agent (>24hrs) due to surgical blood loss or risk of bleeding: not applicable

## 2016-11-06 NOTE — Transfer of Care (Signed)
Immediate Anesthesia Transfer of Care Note  Patient: Savannah Pena  Procedure(s) Performed: Procedure(s): CYSTOSCOPY WITH STENT PLACEMENT (Right)  Patient Location: PACU  Anesthesia Type:General  Level of Consciousness: awake, alert , oriented and patient cooperative  Airway & Oxygen Therapy: Patient Spontanous Breathing  Post-op Assessment: Report given to RN and Post -op Vital signs reviewed and stable  Post vital signs: Reviewed and stable  Last Vitals:  Vitals:   11/06/16 0100 11/06/16 0245  BP:    Pulse:    Resp:    Temp: 37.2 C (!) (P) 35.6 C    Last Pain:  Vitals:   11/06/16 0129  TempSrc:   PainSc: 8          Complications: No apparent anesthesia complications

## 2016-11-06 NOTE — Anesthesia Postprocedure Evaluation (Signed)
Anesthesia Post Note  Patient: Savannah Pena  Procedure(s) Performed: Procedure(s) (LRB): CYSTOSCOPY WITH STENT PLACEMENT (Right)  Patient location during evaluation: PACU Anesthesia Type: General Level of consciousness: awake and alert Pain management: pain level controlled Vital Signs Assessment: post-procedure vital signs reviewed and stable Respiratory status: spontaneous breathing, nonlabored ventilation, respiratory function stable and patient connected to nasal cannula oxygen Cardiovascular status: blood pressure returned to baseline and stable Postop Assessment: no signs of nausea or vomiting Anesthetic complications: no     Last Vitals:  Vitals:   11/06/16 0433 11/06/16 0538  BP: 122/66 125/60  Pulse: (!) 101 (!) 101  Resp: 18 18  Temp: 36.7 C 36.7 C    Last Pain:  Vitals:   11/06/16 0538  TempSrc: Oral  PainSc:                  [name]

## 2016-11-06 NOTE — Anesthesia Preprocedure Evaluation (Signed)
Anesthesia Evaluation  Patient identified by MRN, date of birth, ID band Patient awake    Reviewed: Allergy & Precautions, NPO status , Patient's Chart, lab work & pertinent test results, reviewed documented beta blocker date and time   Airway Mallampati: III  TM Distance: >3 FB     Dental  (+) Chipped   Pulmonary asthma , Current Smoker,           Cardiovascular      Neuro/Psych Seizures -,  PSYCHIATRIC DISORDERS Depression    GI/Hepatic GERD  Controlled,  Endo/Other  diabetes, Type 2  Renal/GU      Musculoskeletal   Abdominal   Peds  Hematology   Anesthesia Other Findings   Reproductive/Obstetrics                             Anesthesia Physical Anesthesia Plan  ASA: III  Anesthesia Plan: General   Post-op Pain Management:    Induction: Intravenous  Airway Management Planned: Oral ETT  Additional Equipment:   Intra-op Plan:   Post-operative Plan:   Informed Consent: I have reviewed the patients History and Physical, chart, labs and discussed the procedure including the risks, benefits and alternatives for the proposed anesthesia with the patient or authorized representative who has indicated his/her understanding and acceptance.     Plan Discussed with: CRNA  Anesthesia Plan Comments:         Anesthesia Quick Evaluation

## 2016-11-06 NOTE — ED Notes (Signed)
Pt. Reports rt. Flank pain that started last night.  Pt. States hx of same.

## 2016-11-06 NOTE — Anesthesia Procedure Notes (Signed)
Procedure Name: Intubation Date/Time: 11/06/2016 2:17 AM Performed by: Waldo Laine Pre-anesthesia Checklist: Patient identified, Emergency Drugs available, Suction available and Timeout performed Patient Re-evaluated:Patient Re-evaluated prior to inductionOxygen Delivery Method: Circle system utilized Preoxygenation: Pre-oxygenation with 100% oxygen Intubation Type: IV induction Ventilation: Mask ventilation without difficulty Laryngoscope Size: Miller and 2 Grade View: Grade II Number of attempts: 1 Airway Equipment and Method: Stylet Placement Confirmation: ETT inserted through vocal cords under direct vision,  positive ETCO2 and breath sounds checked- equal and bilateral Secured at: 20 cm Tube secured with: Tape Dental Injury: Teeth and Oropharynx as per pre-operative assessment

## 2016-11-06 NOTE — Progress Notes (Signed)
Dr. Annabell Howells made aware of lactic acid of 2.6; acknowledged; inquired about reconciliation of PTA meds; Janit Cutter K, RN,12/28/20175:35 AM

## 2016-11-06 NOTE — Plan of Care (Signed)
Problem: Pain Managment: Goal: General experience of comfort will improve Outcome: Progressing Denies pain at this time   

## 2016-11-06 NOTE — Progress Notes (Signed)
No events overnight Pain controlled No n/v/f/c Stent is uncomfortable  Vitals:   11/06/16 0351 11/06/16 0433 11/06/16 0538 11/06/16 1322  BP: 117/61 122/66 125/60 (!) 123/55  Pulse: 100 (!) 101 (!) 101 94  Resp: 18 18 18 18   Temp: 98.1 F (36.7 C) 98.1 F (36.7 C) 98 F (36.7 C) 98.2 F (36.8 C)  TempSrc: Oral Oral Oral Oral  SpO2: 92% 94% 93% 96%  Weight:      Height:       I/O last 3 completed shifts: In: 725 [I.V.:625; IV Piggyback:100] Out: 300 [Urine:300] Total I/O In: -  Out: 850 [Urine:850]  NAD Soft NT ND No foley  CBC    Component Value Date/Time   WBC 28.5 (H) 11/06/2016 0429   RBC 4.53 11/06/2016 0429   HGB 13.9 11/06/2016 0429   HCT 41.2 11/06/2016 0429   PLT 226 11/06/2016 0429   MCV 90.9 11/06/2016 0429   MCH 30.7 11/06/2016 0429   MCHC 33.8 11/06/2016 0429   RDW 13.2 11/06/2016 0429  . BMP Latest Ref Rng & Units 11/06/2016 11/05/2016  Glucose 65 - 99 mg/dL - 235(T)  BUN 6 - 20 mg/dL - 61(W)  Creatinine 4.31 - 1.00 mg/dL 5.40 0.86  Sodium 761 - 145 mmol/L - 138  Potassium 3.5 - 5.1 mmol/L - 3.8  Chloride 101 - 111 mmol/L - 105  CO2 22 - 32 mmol/L - 22  Calcium 8.9 - 10.3 mg/dL - 8.9   POD 0 s/p R stent for infected stone -continue broad spectrum abx -recheck WBC in morning -likely home tomorrow if leukocytosis is improving

## 2016-11-06 NOTE — Op Note (Signed)
NAMEROSEBELLE, EDGEWORTH                 ACCOUNT NO.:  [id number]  MEDICAL RECORD NO.:  [id number]  LOCATION:  ED26A                        FACILITY:  [facility]  PHYSICIAN:  Excell Seltzer. Annabell Howells, M.D.    DATE OF BIRTH:  Dec 01, 1960  DATE OF PROCEDURE:  11/05/2016 DATE OF DISCHARGE:                              OPERATIVE REPORT   PROCEDURE:  Cystoscopy with insertion of right double-J stent.  PREOPERATIVE DIAGNOSIS:  Right proximal ureteral stone with obstruction and sepsis.  POSTOPERATIVE DIAGNOSIS:  Right proximal ureteral stone with obstruction and sepsis.  ANESTHESIA:  General.  SPECIMEN:  None.  DRAINS:  A 6-French 24-cm right double-J stent.  BLOOD LOSS:  None.  COMPLICATIONS:  None.  INDICATIONS:  Joyceann is a 55 year old white female with history of stones, who presented with the onset yesterday of severe right flank pain with nausea, vomiting, and chills.  She was found to have fever in the ER with a white count of 28,000 and it was felt that cystoscopy with stenting was indicated.  FINDINGS OF PROCEDURE:  She was given Rocephin in the emergency room. She was taken to the operating room where general anesthetic was induced.  She was placed in lithotomy position.  Her perineum and genitalia were prepped with Betadine solution.  She was draped in usual sterile fashion.  Cystoscopy was performed using a 23-French scope and 30-degree lens. Examination revealed normal urethra.  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  There was some squamous metaplasia of the trigone.  The ureteral orifices were otherwise unremarkable.  Once cystoscopy was performed, a straight Glidewire was passed through the scope up the right ureteral orifice to the kidney.  Once the wire was by the stone, there was prompt efflux of purulent material.  Once the wire was in the kidney, a 6-French 24-cm double-J stent was advanced over the wire under  fluoroscopic guidance.  The wire was removed leaving good coil in the kidney and good coil in the bladder. The bladder was drained.  The patient was taken down from lithotomy position.  Her anesthetic was reversed.  She was moved to recovery room in stable condition.  There were no complications.     Excell Seltzer. Annabell Howells, M.D.     JJW/MEDQ  D:  11/06/2016  T:  11/06/2016  Job:  [id number]

## 2016-11-06 NOTE — Progress Notes (Signed)
Dr. Annabell Howells paged for lactic acid of 2.6; awaiting call back. Windy Carina, RN 5:26 AM 11/06/2016

## 2016-11-07 DIAGNOSIS — N201 Calculus of ureter: Secondary | ICD-10-CM | POA: Diagnosis not present

## 2016-11-07 LAB — GLUCOSE, CAPILLARY
GLUCOSE-CAPILLARY: 156 mg/dL — AB (ref 65–99)
Glucose-Capillary: 106 mg/dL — ABNORMAL HIGH (ref 65–99)

## 2016-11-07 LAB — CBC
HCT: 35.9 % (ref 35.0–47.0)
HEMOGLOBIN: 12.6 g/dL (ref 12.0–16.0)
MCH: 31.7 pg (ref 26.0–34.0)
MCHC: 35.2 g/dL (ref 32.0–36.0)
MCV: 90.2 fL (ref 80.0–100.0)
Platelets: 166 10*3/uL (ref 150–440)
RBC: 3.98 MIL/uL (ref 3.80–5.20)
RDW: 13.4 % (ref 11.5–14.5)
WBC: 12.6 10*3/uL — AB (ref 3.6–11.0)

## 2016-11-07 LAB — URINE CULTURE: Culture: <10000 — AB

## 2016-11-07 LAB — HEMOGLOBIN A1C
HEMOGLOBIN A1C: 6.9 % — AB (ref 4.8–5.6)
MEAN PLASMA GLUCOSE: 151 mg/dL

## 2016-11-07 LAB — BASIC METABOLIC PANEL
ANION GAP: 6 (ref 5–15)
BUN: 16 mg/dL (ref 6–20)
CHLORIDE: 107 mmol/L (ref 101–111)
CO2: 26 mmol/L (ref 22–32)
Calcium: 8.4 mg/dL — ABNORMAL LOW (ref 8.9–10.3)
Creatinine, Ser: 0.58 mg/dL (ref 0.44–1.00)
GFR calc non Af Amer: >60 mL/min (ref >60)
Glucose, Bld: 150 mg/dL — ABNORMAL HIGH (ref 65–99)
POTASSIUM: 4.2 mmol/L (ref 3.5–5.1)
SODIUM: 139 mmol/L (ref 135–145)

## 2016-11-07 MED ORDER — OXYBUTYNIN CHLORIDE 5 MG PO TABS: 5.0000 mg | ORAL_TABLET | Freq: Three times a day (TID) | ORAL | 0 refills | Status: DC | PRN

## 2016-11-07 MED ORDER — OXYCODONE HCL 5 MG PO TABS: 5.0000 mg | ORAL_TABLET | ORAL | 0 refills | Status: DC | PRN

## 2016-11-07 MED ORDER — CEPHALEXIN 500 MG PO CAPS: 500.0000 mg | ORAL_CAPSULE | Freq: Four times a day (QID) | ORAL | 0 refills | Status: DC

## 2016-11-07 NOTE — Plan of Care (Signed)
Problem: Fluid Volume: Goal: Ability to maintain a balanced intake and output will improve Outcome: Adequate for Discharge Good output overnight.

## 2016-11-07 NOTE — Progress Notes (Signed)
Patient discharged to home as ordered, and instructions given. IV discontinued site clean dry and intact. Patient denies pain at this time and is ambulating without assistance, no acute distress noted. Prescriptions given as ordered and patient instructed on how to take medication. Follow up appointments given as ordered.

## 2016-11-07 NOTE — Discharge Summary (Signed)
Date of admission: 11/05/2016  Date of discharge: 11/07/2016  Admission diagnosis: Right infected ureteral stone  Discharge diagnosis: Same  Secondary diagnoses:  Patient Active Problem List   Diagnosis Date Noted  . Ureteral stone 11/06/2016    History and Physical: For full details, please see admission history and physical. Briefly, Analy Pena is a 55 y.o. year old patient with right ureteral stone with infection s/p right ureteral stent.   Hospital Course: Patient tolerated the procedure well.  She was then transferred to the floor after an uneventful PACU stay.  Her hospital course was uncomplicated.  On POD#2 she had met discharge criteria: was eating a regular diet, was up and ambulating independently,  pain was well controlled, and was ready to for discharge.   Laboratory values:   Recent Labs  11/05/16 2335 11/06/16 0429 11/07/16 0415  WBC 28.3* 28.5* 12.6*  HGB 14.5 13.9 12.6  HCT 41.8 41.2 35.9    Recent Labs  11/05/16 2335 11/06/16 0429 11/07/16 0415  NA 138  --  139  K 3.8  --  4.2  CL 105  --  107  CO2 22  --  26  GLUCOSE 159*  --  150*  BUN 25*  --  16  CREATININE 0.96 0.78 0.58  CALCIUM 8.9  --  8.4*   No results for input(s): LABPT, INR in the last 72 hours. No results for input(s): LABURIN in the last 72 hours. Results for orders placed or performed during the hospital encounter of 11/05/16  Culture, blood (routine x 2)     Status: None (Preliminary result)   Collection Time: 11/06/16  4:21 AM  Result Value Ref Range Status   Specimen Description BLOOD  L AC   Final   Special Requests   Final    BOTTLES DRAWN AEROBIC AND ANAEROBIC  AER 1 ML ANA 1ML   Culture NO GROWTH 1 DAY  Final   Report Status PENDING  Incomplete  Culture, blood (routine x 2)     Status: None (Preliminary result)   Collection Time: 11/06/16  4:29 AM  Result Value Ref Range Status   Specimen Description BLOOD  R AC   Final   Special Requests   Final    BOTTLES DRAWN  AEROBIC AND ANAEROBIC  AER 11 ML ANA 8 ML   Culture NO GROWTH 1 DAY  Final   Report Status PENDING  Incomplete  Culture, Urine     Status: Abnormal   Collection Time: 11/06/16 10:34 AM  Result Value Ref Range Status   Specimen Description URINE, CLEAN CATCH  Final   Special Requests NONE  Final   Culture (A)  Final    <10,000 COLONIES/mL INSIGNIFICANT GROWTH Performed at [facility]    Report Status 11/07/2016 FINAL  Final    Disposition: Home  Discharge instruction: The patient was instructed to be ambulatory but told to refrain from heavy lifting, strenuous activity, or driving.   Discharge medications: Allergies as of 11/07/2016      Reactions   Fruit Extracts Nausea And Vomiting   Bananas      Medication List    TAKE these medications   ADVANCED EYE HEALTH Caps Take 1 capsule by mouth daily.   albuterol (2.5 MG/3ML) 0.083% nebulizer solution Commonly known as:  PROVENTIL Take 2.5 mg by nebulization every 6 (six) hours as needed for wheezing or shortness of breath.   albuterol 108 (90 Base) MCG/ACT inhaler Commonly known as:  PROVENTIL HFA;VENTOLIN HFA  Inhale 2 puffs into the lungs every 4 (four) hours as needed for wheezing or shortness of breath. 2 puffs every 3-4 hours as needed.   amphetamine-dextroamphetamine 30 MG 24 hr capsule Commonly known as:  ADDERALL XR Take 30 mg by mouth daily.   BENADRYL 25 MG tablet Generic drug:  diphenhydrAMINE Take 25 mg by mouth every 6 (six) hours as needed.   BREO ELLIPTA 200-25 MCG/INH Aepb Generic drug:  fluticasone furoate-vilanterol Inhale 1 puff into the lungs daily.   cephALEXin 500 MG capsule Commonly known as:  KEFLEX Take 1 capsule (500 mg total) by mouth 4 (four) times daily.   cetirizine 10 MG tablet Commonly known as:  ZYRTEC Take 10 mg by mouth daily.   FLUoxetine 20 MG capsule Commonly known as:  PROZAC Take 20 mg by mouth daily.   gabapentin 300 MG capsule Commonly known as:   NEURONTIN Take 300 mg by mouth 2 (two) times daily.   lisinopril 20 MG tablet Commonly known as:  PRINIVIL,ZESTRIL Take 20 mg by mouth daily.   meloxicam 7.5 MG tablet Commonly known as:  MOBIC Take 7.5 mg by mouth 2 (two) times daily.   metFORMIN 500 MG tablet Commonly known as:  GLUCOPHAGE Take by mouth 2 (two) times daily with a meal.   naproxen 500 MG tablet Commonly known as:  NAPROSYN Take 500 mg by mouth 2 (two) times daily with a meal.   omeprazole 40 MG capsule Commonly known as:  PRILOSEC Take 40 mg by mouth daily.   oxybutynin 5 MG tablet Commonly known as:  DITROPAN Take 1 tablet (5 mg total) by mouth every 8 (eight) hours as needed for bladder spasms.   oxyCODONE 5 MG immediate release tablet Commonly known as:  Oxy IR/ROXICODONE Take 1 tablet (5 mg total) by mouth every 4 (four) hours as needed for moderate pain.   phenytoin 100 MG ER capsule Commonly known as:  DILANTIN Take 300 mg by mouth daily.   simvastatin 40 MG tablet Commonly known as:  ZOCOR Take 40 mg by mouth daily.       Followup:  [facility] Follow up in 2 week(s).   Specialty:  Urology Contact information: [address], [place] [phone]

## 2016-11-09 LAB — BLOOD CULTURE ID PANEL (REFLEXED)
Acinetobacter baumannii: NOT DETECTED
CANDIDA PARAPSILOSIS: NOT DETECTED
CANDIDA TROPICALIS: NOT DETECTED
CARBAPENEM RESISTANCE: NOT DETECTED
Candida albicans: NOT DETECTED
Candida glabrata: NOT DETECTED
Candida krusei: NOT DETECTED
ENTEROBACTERIACEAE SPECIES: NOT DETECTED
ENTEROCOCCUS SPECIES: NOT DETECTED
Enterobacter cloacae complex: NOT DETECTED
Escherichia coli: NOT DETECTED
Haemophilus influenzae: NOT DETECTED
KLEBSIELLA PNEUMONIAE: NOT DETECTED
Klebsiella oxytoca: NOT DETECTED
LISTERIA MONOCYTOGENES: NOT DETECTED
Methicillin resistance: NOT DETECTED
Neisseria meningitidis: NOT DETECTED
PROTEUS SPECIES: NOT DETECTED
PSEUDOMONAS AERUGINOSA: NOT DETECTED
SERRATIA MARCESCENS: NOT DETECTED
STAPHYLOCOCCUS AUREUS BCID: NOT DETECTED
STAPHYLOCOCCUS SPECIES: NOT DETECTED
Streptococcus agalactiae: NOT DETECTED
Streptococcus pneumoniae: NOT DETECTED
Streptococcus pyogenes: NOT DETECTED
Streptococcus species: NOT DETECTED
VANCOMYCIN RESISTANCE: NOT DETECTED

## 2016-11-11 ENCOUNTER — Telehealth: Payer: Self-pay | Admitting: Urology

## 2016-11-11 LAB — CULTURE, BLOOD (ROUTINE X 2): Culture: NO GROWTH

## 2016-11-11 NOTE — Telephone Encounter (Signed)
-----   Message from Hildred Laser, MD sent at 11/07/2016  1:10 PM EST ----- Patient needs to see any provider to discuss surgery in 2 weeks. thanks

## 2016-11-11 NOTE — Telephone Encounter (Signed)
done

## 2016-11-13 ENCOUNTER — Encounter: Payer: Self-pay | Admitting: Urology

## 2016-11-25 ENCOUNTER — Ambulatory Visit (INDEPENDENT_AMBULATORY_CARE_PROVIDER_SITE_OTHER): Payer: Medicaid Other | Admitting: Urology

## 2016-11-25 ENCOUNTER — Encounter: Payer: Self-pay | Admitting: Radiology

## 2016-11-25 ENCOUNTER — Other Ambulatory Visit: Payer: Self-pay | Admitting: Radiology

## 2016-11-25 ENCOUNTER — Encounter: Payer: Self-pay | Admitting: Urology

## 2016-11-25 VITALS — BP 112/65 | HR 85 | Ht 64.0 in | Wt 244.0 lb

## 2016-11-25 DIAGNOSIS — N201 Calculus of ureter: Secondary | ICD-10-CM

## 2016-11-25 MED ORDER — CEPHALEXIN 500 MG PO CAPS: 500.0000 mg | ORAL_CAPSULE | Freq: Three times a day (TID) | ORAL | 0 refills | Status: DC

## 2016-11-25 MED ORDER — OXYBUTYNIN CHLORIDE 5 MG PO TABS: 5.0000 mg | ORAL_TABLET | Freq: Three times a day (TID) | ORAL | 1 refills | Status: AC | PRN

## 2016-11-25 NOTE — Progress Notes (Signed)
11/25/2016 9:09 AM   Savannah Pena 08-01-1961 [id number]  Referring provider: Evelene Croon, MD [address] [place], [place] [address]  CC: Rt Ureteral stone  HPI:  1 - Recurrent Nephrolithiasis -  Pre 2018 -  SWL x2, cystolithalopexy x1. 11/2016 - Rt 10mm fusiform mid stone (just above ilicas) by ER CT and stented 12/28 by [facility] with 6x24 in setting of suspect obstructed pyelo. Stone is solitary. SSD >15cm. Final UCX negative.   2 - Medical Stone Disease -  Eval 2018: BMP (low-normal Ca 8.4), PTH, Urate - pending; Composition - pending; 24 hr urines - pending  PMH sig for obesity.  Today "Savannah Pena" is seen in f/u above and discuss definitive managent for Rt ureteral stone. Her final UCX from prior hospitalization negative.    PMH: Past Medical History:  Diagnosis Date  . Asthma   . Diabetes mellitus without complication (HCC)   . GERD (gastroesophageal reflux disease)   . Major depression   . Seizures [facility])     Surgical History: Past Surgical History:  Procedure Laterality Date  . CESAREAN SECTION    . CYSTOSCOPY WITH STENT PLACEMENT Right 11/06/2016   Procedure: CYSTOSCOPY WITH STENT PLACEMENT;  Surgeon: Bjorn Pippin, MD;  Location: [facility] ORS;  Service: Urology;  Laterality: Right;  . KIDNEY STONE SURGERY      Home Medications:  Allergies as of 11/25/2016      Reactions   Fruit Extracts Nausea And Vomiting   Bananas      Medication List       Accurate as of 11/25/16  9:09 AM. Always use your most recent med list.          ADVANCED EYE HEALTH Caps Take 1 capsule by mouth daily.   albuterol (2.5 MG/3ML) 0.083% nebulizer solution Commonly known as:  PROVENTIL Take 2.5 mg by nebulization every 6 (six) hours as needed for wheezing or shortness of breath.   albuterol 108 (90 Base) MCG/ACT inhaler Commonly known as:  PROVENTIL HFA;VENTOLIN HFA Inhale 2 puffs into the lungs every 4 (four) hours as needed for wheezing or shortness of breath. 2 puffs every 3-4  hours as needed.   amphetamine-dextroamphetamine 30 MG 24 hr capsule Commonly known as:  ADDERALL XR Take 30 mg by mouth daily.   BENADRYL 25 MG tablet Generic drug:  diphenhydrAMINE Take 25 mg by mouth every 6 (six) hours as needed.   BREO ELLIPTA 200-25 MCG/INH Aepb Generic drug:  fluticasone furoate-vilanterol Inhale 1 puff into the lungs daily.   cephALEXin 500 MG capsule Commonly known as:  KEFLEX Take 1 capsule (500 mg total) by mouth 4 (four) times daily.   cetirizine 10 MG tablet Commonly known as:  ZYRTEC Take 10 mg by mouth daily.   FLUoxetine 20 MG capsule Commonly known as:  PROZAC Take 20 mg by mouth daily.   gabapentin 300 MG capsule Commonly known as:  NEURONTIN Take 300 mg by mouth 2 (two) times daily.   lisinopril 20 MG tablet Commonly known as:  PRINIVIL,ZESTRIL Take 20 mg by mouth daily.   meloxicam 7.5 MG tablet Commonly known as:  MOBIC Take 7.5 mg by mouth 2 (two) times daily.   metFORMIN 500 MG tablet Commonly known as:  GLUCOPHAGE Take by mouth 2 (two) times daily with a meal.   naproxen 500 MG tablet Commonly known as:  NAPROSYN Take 500 mg by mouth 2 (two) times daily with a meal.   omeprazole 40 MG capsule Commonly known as:  PRILOSEC Take  40 mg by mouth daily.   oxybutynin 5 MG tablet Commonly known as:  DITROPAN Take 1 tablet (5 mg total) by mouth every 8 (eight) hours as needed for bladder spasms.   oxyCODONE 5 MG immediate release tablet Commonly known as:  Oxy IR/ROXICODONE Take 1 tablet (5 mg total) by mouth every 4 (four) hours as needed for moderate pain.   phenytoin 100 MG ER capsule Commonly known as:  DILANTIN Take 300 mg by mouth daily.   simvastatin 40 MG tablet Commonly known as:  ZOCOR Take 40 mg by mouth daily.       Allergies:  Allergies  Allergen Reactions  . Fruit Extracts Nausea And Vomiting    Bananas     Review of Systems  Gastrointestinal (upper)  : Negative for upper GI  symptoms  Gastrointestinal (lower) : Negative for lower GI symptoms  Constitutional : Negative for symptoms  Skin: Negative for skin symptoms  Eyes: Negative for eye symptoms  Ear/Nose/Throat : Negative for Ear/Nose/Throat symptoms  Hematologic/Lymphatic: Negative for Hematologic/Lymphatic symptoms  Cardiovascular : Negative for cardiovascular symptoms  Respiratory : Negative for respiratory symptoms  Endocrine: Negative for endocrine symptoms  Musculoskeletal: Negative for musculoskeletal symptoms  Neurological: Negative for neurological symptoms  Psychologic: Negative for psychiatric symptoms   Family History: No family history on file.  Social History:  reports that she has been smoking Cigarettes.  She uses smokeless tobacco. She reports that she drinks alcohol. She reports that she does not use drugs.  Physical Exam: There were no vitals taken for this visit.  Constitutional:  Alert and oriented, No acute distress. HEENT: [place] AT, moist mucus membranes.  Trachea midline, no masses. Cardiovascular: No clubbing, cyanosis, or edema. Respiratory: Normal respiratory effort, no increased work of breathing. GI: Abdomen is soft, nontender, nondistended, no abdominal masses. Morbid truncal obesity limits exam.  GU: No CVA tenderness.  Skin: No rashes, bruises or suspicious lesions. Lymph: No cervical or inguinal adenopathy. Neurologic: Grossly intact, no focal deficits, moving all 4 extremities. Psychiatric: Normal mood and affect.  Laboratory Data: Lab Results  Component Value Date   WBC 12.6 (H) 11/07/2016   HGB 12.6 11/07/2016   HCT 35.9 11/07/2016   MCV 90.2 11/07/2016   PLT 166 11/07/2016    Lab Results  Component Value Date   CREATININE 0.58 11/07/2016    No results found for: PSA  No results found for: TESTOSTERONE  Lab Results  Component Value Date   HGBA1C 6.9 (H) 11/06/2016     Pertinent Imaging: As per HPI  Assessment & Plan:     1 - Recurrent Nephrolithiasis - rec outpatient ureteroscopy next avail for current Rt ureteral stone. Keflex to start 2 days prior RX'd today.   2 - Medical Stone Disease -  High risk stone former. PTH, Uric Acid today. Composition and 24 hr urines to follow.    Sebastian Ache, MD  [facility] [address], [address] [place], [place] [address] [phone]

## 2016-11-26 LAB — PTH, INTACT AND CALCIUM
CALCIUM: 9.3 mg/dL (ref 8.7–10.2)
PTH: 46 pg/mL (ref 15–65)

## 2016-11-26 LAB — URIC ACID: Uric Acid: 4.1 mg/dL (ref 2.5–7.1)

## 2016-11-28 ENCOUNTER — Encounter
Admission: RE | Admit: 2016-11-28 | Discharge: 2016-11-28 | Disposition: A | Discharge status: Home / Self Care | Payer: Medicaid Other | Source: Ambulatory Visit | Attending: Urology | Admitting: Urology

## 2016-11-28 DIAGNOSIS — Z01818 Encounter for other preprocedural examination: Secondary | ICD-10-CM | POA: Insufficient documentation

## 2016-11-28 DIAGNOSIS — I10 Essential (primary) hypertension: Secondary | ICD-10-CM | POA: Diagnosis not present

## 2016-11-28 HISTORY — DX: Sleep apnea, unspecified: G47.30

## 2016-11-28 HISTORY — DX: Anxiety disorder, unspecified: F41.9

## 2016-11-28 HISTORY — DX: Chronic obstructive pulmonary disease, unspecified: J44.9

## 2016-11-28 HISTORY — DX: Other specified behavioral and emotional disorders with onset usually occurring in childhood and adolescence: F98.8

## 2016-11-28 HISTORY — DX: Unspecified osteoarthritis, unspecified site: M19.90

## 2016-11-28 HISTORY — DX: Dyspnea, unspecified: R06.00

## 2016-11-28 HISTORY — DX: Personal history of urinary calculi: Z87.442

## 2016-11-28 HISTORY — DX: Essential (primary) hypertension: I10

## 2016-11-28 NOTE — Patient Instructions (Signed)
  Your procedure is scheduled on: December 05, 2016 (Friday) Report to Same Day Surgery 2nd floor medical mall [facility] Entrance-take elevator on left to 2nd floor.  Check in with surgery information desk.) To find out your arrival time please call [phone] between 1PM - 3PM on December 04, 2016 (Thursday)  Remember: Instructions that are not followed completely may result in serious medical risk, up to and including death, or upon the discretion of your surgeon and anesthesiologist your surgery may need to be rescheduled.    _x___ 1. Do not eat food or drink liquids after midnight. No gum chewing or hard candies.     __x__ 2. No Alcohol for 24 hours before or after surgery.   __x__3. No Smoking for 24 prior to surgery.   ____  4. Bring all medications with you on the day of surgery if instructed.    __x__ 5. Notify your doctor if there is any change in your medical condition     (cold, fever, infections).     Do not wear jewelry, make-up, hairpins, clips or nail polish.  Do not wear lotions, powders, or perfumes. You may wear deodorant.  Do not shave 48 hours prior to surgery. Men may shave face and neck.  Do not bring valuables to the hospital.    [facility] is not responsible for any belongings or valuables.               Contacts, dentures or bridgework may not be worn into surgery.  Leave your suitcase in the car. After surgery it may be brought to your room.  For patients admitted to the hospital, discharge time is determined by your treatment team.   Patients discharged the day of surgery will not be allowed to drive home.  You will need someone to drive you home and stay with you the night of your procedure.    Please read over the following fact sheets that you were given:   [facility] Preparing for Surgery and or MRSA Information   _x___ Take these medicines the morning of surgery with A SIP OF WATER:    1. Omeprazole (Omeprazole at bedtime Thursday night)  2.  Fluoxetine  3. Gabapentin  4. Lisinopril  5. Montelukast  6. Dilantin             7. Simvastatin  ____Fleets enema or Magnesium Citrate as directed.   ___ Use CHG Soap or sage wipes as directed on instruction sheet   _x___ Use inhalers on the day of surgery and bring to hospital day of surgery (Use Albuterol nebulizer, Breo Ellipta and Spiriva inhalers the morning of surgery and bring all inhalers to hospital the morning of surgery )  _x___ Stop metformin 2 days prior to surgery (Stop Metformin on January 24)    ____ Take 1/2 of usual insulin dose the night before surgery and none on the morning of           surgery.   ____ Stop Aspirin, Coumadin, Pllavix ,Eliquis, Effient, or Pradaxa  x__ Stop Anti-inflammatories such as Advil, Aleve, Ibuprofen, Motrin, Naproxen,          Naprosyn, Goodies powders or aspirin products.  (Stop Meloxicam and Ibuprofen now)  Ok to take Tylenol.   __x__ Stop supplements until after surgery. (Stop Preservision now)  ____ Bring C-Pap to the hospital.

## 2016-12-04 MED ORDER — GENTAMICIN IN SALINE 1.6-0.9 MG/ML-% IV SOLN
80.0000 mg | INTRAVENOUS | Status: AC
  Administered 2016-12-05: 80 mg via INTRAVENOUS
  Filled 2016-12-04: qty 50

## 2016-12-05 ENCOUNTER — Ambulatory Visit
Admission: RE | Admit: 2016-12-05 | Discharge: 2016-12-05 | Disposition: A | Discharge status: Home / Self Care | Payer: Medicaid Other | Source: Ambulatory Visit | Attending: Urology | Admitting: Urology

## 2016-12-05 ENCOUNTER — Ambulatory Visit: Payer: Medicaid Other | Admitting: Anesthesiology

## 2016-12-05 ENCOUNTER — Encounter
Admission: RE | Disposition: A | Discharge status: Home / Self Care | Payer: Self-pay | Source: Ambulatory Visit | Attending: Urology

## 2016-12-05 DIAGNOSIS — E119 Type 2 diabetes mellitus without complications: Secondary | ICD-10-CM | POA: Diagnosis not present

## 2016-12-05 DIAGNOSIS — R569 Unspecified convulsions: Secondary | ICD-10-CM | POA: Insufficient documentation

## 2016-12-05 DIAGNOSIS — N201 Calculus of ureter: Secondary | ICD-10-CM | POA: Insufficient documentation

## 2016-12-05 DIAGNOSIS — Z711 Person with feared health complaint in whom no diagnosis is made: Secondary | ICD-10-CM | POA: Diagnosis not present

## 2016-12-05 DIAGNOSIS — Z7951 Long term (current) use of inhaled steroids: Secondary | ICD-10-CM | POA: Insufficient documentation

## 2016-12-05 DIAGNOSIS — Z7984 Long term (current) use of oral hypoglycemic drugs: Secondary | ICD-10-CM | POA: Diagnosis not present

## 2016-12-05 DIAGNOSIS — F329 Major depressive disorder, single episode, unspecified: Secondary | ICD-10-CM | POA: Insufficient documentation

## 2016-12-05 DIAGNOSIS — K219 Gastro-esophageal reflux disease without esophagitis: Secondary | ICD-10-CM | POA: Insufficient documentation

## 2016-12-05 DIAGNOSIS — Z791 Long term (current) use of non-steroidal anti-inflammatories (NSAID): Secondary | ICD-10-CM | POA: Insufficient documentation

## 2016-12-05 DIAGNOSIS — Z79899 Other long term (current) drug therapy: Secondary | ICD-10-CM | POA: Insufficient documentation

## 2016-12-05 DIAGNOSIS — F988 Other specified behavioral and emotional disorders with onset usually occurring in childhood and adolescence: Secondary | ICD-10-CM | POA: Insufficient documentation

## 2016-12-05 DIAGNOSIS — Z87891 Personal history of nicotine dependence: Secondary | ICD-10-CM | POA: Insufficient documentation

## 2016-12-05 DIAGNOSIS — J449 Chronic obstructive pulmonary disease, unspecified: Secondary | ICD-10-CM | POA: Insufficient documentation

## 2016-12-05 DIAGNOSIS — I10 Essential (primary) hypertension: Secondary | ICD-10-CM | POA: Insufficient documentation

## 2016-12-05 DIAGNOSIS — Z87442 Personal history of urinary calculi: Secondary | ICD-10-CM | POA: Insufficient documentation

## 2016-12-05 HISTORY — PX: CYSTOSCOPY WITH RETROGRADE PYELOGRAM, URETEROSCOPY AND STENT PLACEMENT: SHX5789

## 2016-12-05 LAB — GLUCOSE, CAPILLARY
GLUCOSE-CAPILLARY: 140 mg/dL — AB (ref 65–99)
Glucose-Capillary: 97 mg/dL (ref 65–99)

## 2016-12-05 SURGERY — CYSTOURETEROSCOPY, WITH RETROGRADE PYELOGRAM AND STENT INSERTION
Anesthesia: General | Laterality: Right | Wound class: Clean Contaminated

## 2016-12-05 MED ORDER — LIDOCAINE HCL 2 % EX GEL
CUTANEOUS | Status: AC
  Filled 2016-12-05: qty 10

## 2016-12-05 MED ORDER — IOTHALAMATE MEGLUMINE 60 % INJ SOLN
INTRAMUSCULAR | Status: DC | PRN
  Administered 2016-12-05: 46 mL

## 2016-12-05 MED ORDER — ONDANSETRON HCL 4 MG/2ML IJ SOLN
INTRAMUSCULAR | Status: DC | PRN
  Administered 2016-12-05: 4 mg via INTRAVENOUS

## 2016-12-05 MED ORDER — SENNOSIDES-DOCUSATE SODIUM 8.6-50 MG PO TABS: 1.0000 | ORAL_TABLET | Freq: Two times a day (BID) | ORAL | 0 refills | Status: DC

## 2016-12-05 MED ORDER — KETOROLAC TROMETHAMINE 30 MG/ML IJ SOLN
INTRAMUSCULAR | Status: DC | PRN
  Administered 2016-12-05: 30 mg via INTRAVENOUS

## 2016-12-05 MED ORDER — PROMETHAZINE HCL 25 MG/ML IJ SOLN: 6.2500 mg | INTRAMUSCULAR | Status: DC | PRN

## 2016-12-05 MED ORDER — OXYCODONE-ACETAMINOPHEN 5-325 MG PO TABS: 1.0000 | ORAL_TABLET | Freq: Four times a day (QID) | ORAL | 0 refills | Status: DC | PRN

## 2016-12-05 MED ORDER — ONDANSETRON HCL 4 MG/2ML IJ SOLN
INTRAMUSCULAR | Status: AC
  Filled 2016-12-05: qty 2

## 2016-12-05 MED ORDER — FENTANYL CITRATE (PF) 100 MCG/2ML IJ SOLN
INTRAMUSCULAR | Status: AC
  Filled 2016-12-05: qty 2

## 2016-12-05 MED ORDER — PHENYLEPHRINE HCL 10 MG/ML IJ SOLN
INTRAMUSCULAR | Status: DC | PRN
  Administered 2016-12-05 (×2): 80 ug via INTRAVENOUS

## 2016-12-05 MED ORDER — PROPOFOL 10 MG/ML IV BOLUS
INTRAVENOUS | Status: AC
  Filled 2016-12-05: qty 20

## 2016-12-05 MED ORDER — SEVOFLURANE IN SOLN
RESPIRATORY_TRACT | Status: AC
  Filled 2016-12-05: qty 250

## 2016-12-05 MED ORDER — LIDOCAINE HCL (CARDIAC) 20 MG/ML IV SOLN
INTRAVENOUS | Status: DC | PRN
  Administered 2016-12-05: 40 mg via INTRAVENOUS

## 2016-12-05 MED ORDER — ATROPINE SULFATE 0.4 MG/ML IV SOSY
PREFILLED_SYRINGE | INTRAVENOUS | Status: AC
  Filled 2016-12-05: qty 2.5

## 2016-12-05 MED ORDER — PHENYLEPHRINE 40 MCG/ML (10ML) SYRINGE FOR IV PUSH (FOR BLOOD PRESSURE SUPPORT)
PREFILLED_SYRINGE | INTRAVENOUS | Status: AC
  Filled 2016-12-05: qty 10

## 2016-12-05 MED ORDER — SODIUM CHLORIDE 0.9 % IV SOLN
INTRAVENOUS | Status: DC
  Administered 2016-12-05: 1000 mL via INTRAVENOUS

## 2016-12-05 MED ORDER — MIDAZOLAM HCL 2 MG/2ML IJ SOLN
INTRAMUSCULAR | Status: DC | PRN
  Administered 2016-12-05: 2 mg via INTRAVENOUS

## 2016-12-05 MED ORDER — FENTANYL CITRATE (PF) 100 MCG/2ML IJ SOLN
INTRAMUSCULAR | Status: DC | PRN
  Administered 2016-12-05 (×4): 25 ug via INTRAVENOUS

## 2016-12-05 MED ORDER — PROPOFOL 10 MG/ML IV BOLUS
INTRAVENOUS | Status: DC | PRN
Start: 2016-12-05 — End: 2016-12-05
  Administered 2016-12-05: 150 mg via INTRAVENOUS

## 2016-12-05 MED ORDER — BELLADONNA ALKALOIDS-OPIUM 16.2-60 MG RE SUPP
RECTAL | Status: AC
  Filled 2016-12-05: qty 1

## 2016-12-05 MED ORDER — FENTANYL CITRATE (PF) 100 MCG/2ML IJ SOLN: 25.0000 ug | INTRAMUSCULAR | Status: DC | PRN

## 2016-12-05 MED ORDER — KETOROLAC TROMETHAMINE 30 MG/ML IJ SOLN
INTRAMUSCULAR | Status: AC
  Filled 2016-12-05: qty 1

## 2016-12-05 MED ORDER — MIDAZOLAM HCL 2 MG/2ML IJ SOLN
INTRAMUSCULAR | Status: AC
  Filled 2016-12-05: qty 2

## 2016-12-05 MED ORDER — CEPHALEXIN 500 MG PO CAPS: 500.0000 mg | ORAL_CAPSULE | Freq: Two times a day (BID) | ORAL | 0 refills | Status: DC

## 2016-12-05 SURGICAL SUPPLY — 36 items
BAG DRAIN CYSTO-URO LG1000N (MISCELLANEOUS) ×2 IMPLANT
BASKET NITINOL 4 WIRE 16 (BASKET) IMPLANT
BASKET ZERO TIP 1.9FR (BASKET) IMPLANT
CATH FOL LX CONE TIP  8F (CATHETERS)
CATH FOL LX CONE TIP 8F (CATHETERS) IMPLANT
CATH URETL 5X70 OPEN END (CATHETERS) ×2 IMPLANT
CATH URETL OPEN END 6X70 (CATHETERS) IMPLANT
CONRAY 43 FOR UROLOGY 50M (MISCELLANEOUS) ×2 IMPLANT
FEE TECHNICIAN ONLY PER HOUR (MISCELLANEOUS) IMPLANT
GLOVE BIO SURGEON STRL SZ7 (GLOVE) IMPLANT
GLOVE BIO SURGEON STRL SZ7.5 (GLOVE) ×6 IMPLANT
GOWN STRL REUS W/ TWL LRG LVL3 (GOWN DISPOSABLE) IMPLANT
GOWN STRL REUS W/ TWL LRG LVL4 (GOWN DISPOSABLE) IMPLANT
GOWN STRL REUS W/ TWL XL LVL3 (GOWN DISPOSABLE) ×3 IMPLANT
GOWN STRL REUS W/TWL LRG LVL3 (GOWN DISPOSABLE)
GOWN STRL REUS W/TWL LRG LVL4 (GOWN DISPOSABLE)
GOWN STRL REUS W/TWL XL LVL3 (GOWN DISPOSABLE) ×3
GUIDEWIRE STR ZIPWIRE 035X150 (MISCELLANEOUS) ×2 IMPLANT
KIT RM TURNOVER CYSTO AR (KITS) ×2 IMPLANT
LASER HOLMIUM FIBER SU 272UM (MISCELLANEOUS) IMPLANT
PACK CYSTO AR (MISCELLANEOUS) ×2 IMPLANT
PREP PVP WINGED SPONGE (MISCELLANEOUS) IMPLANT
SENSORWIRE 0.038 NOT ANGLED (WIRE) ×2
SET CYSTO W/LG BORE CLAMP LF (SET/KITS/TRAYS/PACK) ×2 IMPLANT
SHEATH URETERAL 12FRX35CM (MISCELLANEOUS) ×2 IMPLANT
SOL .9 NS 3000ML IRR  AL (IV SOLUTION) ×1
SOL .9 NS 3000ML IRR UROMATIC (IV SOLUTION) ×1 IMPLANT
SOL PREP PVP 2OZ (MISCELLANEOUS) ×2
SOLUTION PREP PVP 2OZ (MISCELLANEOUS) ×1 IMPLANT
STENT URET 6FRX24 CONTOUR (STENTS) ×2 IMPLANT
SURGILUBE 2OZ TUBE FLIPTOP (MISCELLANEOUS) ×2 IMPLANT
SYRINGE 10CC LL (SYRINGE) ×2 IMPLANT
SYRINGE IRR TOOMEY STRL 70CC (SYRINGE) ×2 IMPLANT
TUBE FEED INF 8F (TUBING) IMPLANT
WATER STERILE IRR 1000ML POUR (IV SOLUTION) ×2 IMPLANT
WIRE SENSOR 0.038 NOT ANGLED (WIRE) ×1 IMPLANT

## 2016-12-05 NOTE — Anesthesia Post-op Follow-up Note (Cosign Needed)
Anesthesia QCDR form completed.        

## 2016-12-05 NOTE — Anesthesia Postprocedure Evaluation (Signed)
Anesthesia Post Note  Patient: Savannah Pena  Procedure(s) Performed: Procedure(s) (LRB): CYSTOSCOPY WITH RETROGRADE PYELOGRAM, URETEROSCOPY AND STENT PLACEMENT (Right)  Patient location during evaluation: PACU Anesthesia Type: General Level of consciousness: awake and alert Pain management: pain level controlled Vital Signs Assessment: post-procedure vital signs reviewed and stable Respiratory status: spontaneous breathing, nonlabored ventilation, respiratory function stable and patient connected to nasal cannula oxygen Cardiovascular status: blood pressure returned to baseline and stable Postop Assessment: no signs of nausea or vomiting Anesthetic complications: no     Last Vitals:  Vitals:   12/05/16 0923 12/05/16 0945  BP: 115/67 (!) 112/56  Pulse: 66 73  Resp: 14 14  Temp: 36.2 C     Last Pain:  Vitals:   12/05/16 0923  TempSrc: Temporal  PainSc:                  Lenard Simmer

## 2016-12-05 NOTE — Discharge Instructions (Signed)
1 - You may have urinary urgency (bladder spasms) and bloody urine on / off with stent in place. This is normal.  2 - Remove tethered stent on Monday morning at home by pulling on string, then blue-white plastic tubing, and discarding. Office is open Monday if any issues arise.   3 - Call MD or go to ER for fever >102, severe pain / nausea / vomiting not relieved by medications, or acute change in medical status  AMBULATORY SURGERY  DISCHARGE INSTRUCTIONS   1) The drugs that you were given will stay in your system until tomorrow so for the next 24 hours you should not:  A) Drive an automobile B) Make any legal decisions C) Drink any alcoholic beverage   2) You may resume regular meals tomorrow.  Today it is better to start with liquids and gradually work up to solid foods.  You may eat anything you prefer, but it is better to start with liquids, then soup and crackers, and gradually work up to solid foods.   3) Please notify your doctor immediately if you have any unusual bleeding, trouble breathing, redness and pain at the surgery site, drainage, fever, or pain not relieved by medication.   Please contact your physician with any problems or Same Day Surgery at [phone], Monday through Friday 6 am to 4 pm, or [place] [facility] number at [phone].

## 2016-12-05 NOTE — Anesthesia Preprocedure Evaluation (Signed)
Anesthesia Evaluation  Patient identified by MRN, date of birth, ID band Patient awake    Reviewed: Allergy & Precautions, H&P , NPO status , Patient's Chart, lab work & pertinent test results, reviewed documented beta blocker date and time   History of Anesthesia Complications Negative for: history of anesthetic complications  Airway Mallampati: III  TM Distance: >3 FB Neck ROM: full    Dental  (+) Chipped, Dental Advidsory Given   Pulmonary shortness of breath and with exertion, asthma , sleep apnea , COPD,  COPD inhaler, Recent URI , Residual Cough, former smoker,           Cardiovascular Exercise Tolerance: Good hypertension, (-) angina(-) CAD, (-) Past MI, (-) Cardiac Stents and (-) CABG (-) dysrhythmias (-) Valvular Problems/Murmurs     Neuro/Psych Seizures -, Well Controlled,  PSYCHIATRIC DISORDERS (Depression)    GI/Hepatic Neg liver ROS, GERD  ,  Endo/Other  diabetesMorbid obesity  Renal/GU Renal disease (kidney stones)  negative genitourinary   Musculoskeletal   Abdominal   Peds  Hematology negative hematology ROS (+)   Anesthesia Other Findings Past Medical History: No date: Anxiety No date: Arthritis No date: Asthma No date: Attention deficit disorder (ADD) No date: COPD (chronic obstructive pulmonary disease) (* No date: Diabetes mellitus without complication (HCC) No date: Dyspnea     Comment: with exertion No date: GERD (gastroesophageal reflux disease) No date: History of kidney stones No date: Hypertension No date: Major depression No date: Seizures (HCC)     Comment: last seizure 23 years ago No date: Sleep apnea     Comment: has C-PAP, but does not currently use   Reproductive/Obstetrics negative OB ROS                             Anesthesia Physical Anesthesia Plan  ASA: III  Anesthesia Plan: General   Post-op Pain Management:    Induction:   Airway  Management Planned:   Additional Equipment:   Intra-op Plan:   Post-operative Plan:   Informed Consent: I have reviewed the patients History and Physical, chart, labs and discussed the procedure including the risks, benefits and alternatives for the proposed anesthesia with the patient or authorized representative who has indicated his/her understanding and acceptance.   Dental Advisory Given  Plan Discussed with: Anesthesiologist, CRNA and Surgeon  Anesthesia Plan Comments:         Anesthesia Quick Evaluation

## 2016-12-05 NOTE — Telephone Encounter (Signed)
-----   Message from Sebastian Ache, MD sent at 12/05/2016  8:29 AM EST ----- Regarding: Non-urgent follow up Ms. Earnest needs f/u after ureteroscopy on 1/26, any provider is fine. Prefer in about 2 weeks.  Thanks, [employer]

## 2016-12-05 NOTE — Telephone Encounter (Signed)
Appt scheduled, Monday 2/12 @ 2:15pm w/ Dr. Berneice Heinrich.

## 2016-12-05 NOTE — Anesthesia Procedure Notes (Signed)
Procedure Name: LMA Insertion Date/Time: 12/05/2016 7:53 AM Performed by: Edmund Hilda Pre-anesthesia Checklist: Patient identified Patient Re-evaluated:Patient Re-evaluated prior to inductionOxygen Delivery Method: Circle system utilized Preoxygenation: Pre-oxygenation with 100% oxygen Intubation Type: IV induction Ventilation: Mask ventilation without difficulty LMA: LMA inserted LMA Size: 4.0 Number of attempts: 1 Placement Confirmation: positive ETCO2 and breath sounds checked- equal and bilateral

## 2016-12-05 NOTE — H&P (Signed)
Savannah Pena is an 56 y.o. female.    Chief Complaint: Pre-op RIGHT Ureteroscopic Stone Manipulation  HPI:   1 - RIGHT Ureteral Stone - s/p Rt JJ stent 10/2016 for obstructing stone in setting of impending sepsis. Final UCX/BCX negative. Stone is solitary, 57mm mid ureteral (just above iliac crossing), SSD 17cm, 430HU.  Today "Savannah Pena" is seen to proceed with RIGHT ureteroscopic stone manipulation. No interval fevers.   Past Medical History:  Diagnosis Date  . Anxiety   . Arthritis   . Asthma   . Attention deficit disorder (ADD)   . COPD (chronic obstructive pulmonary disease) (HCC)   . Diabetes mellitus without complication (HCC)   . Dyspnea    with exertion  . GERD (gastroesophageal reflux disease)   . History of kidney stones   . Hypertension   . Major depression   . Seizures (HCC)    last seizure 23 years ago  . Sleep apnea    has C-PAP, but does not currently use    Past Surgical History:  Procedure Laterality Date  . CESAREAN SECTION    . CYSTOSCOPY WITH STENT PLACEMENT Right 11/06/2016   Procedure: CYSTOSCOPY WITH STENT PLACEMENT;  Surgeon: Bjorn Pippin, MD;  Location: [facility] ORS;  Service: Urology;  Laterality: Right;  . FLEXOR TENDON REPAIR Right    foot  . KIDNEY STONE SURGERY      Family History  Problem Relation Age of Onset  . Prostate cancer Father   . Diabetes Father    Social History:  reports that she quit smoking about 3 weeks ago. Her smoking use included Cigarettes. She has quit using smokeless tobacco. She reports that she drinks alcohol. She reports that she does not use drugs.  Allergies:  Allergies  Allergen Reactions  . Fruit Extracts Nausea And Vomiting    Bananas    Medications Prior to Admission  Medication Sig Dispense Refill  . albuterol (PROVENTIL HFA;VENTOLIN HFA) 108 (90 Base) MCG/ACT inhaler Inhale 2 puffs into the lungs every 4 (four) hours as needed for wheezing or shortness of breath.     Marland Kitchen albuterol (PROVENTIL) (2.5 MG/3ML) 0.083%  nebulizer solution Take 2.5 mg by nebulization every 6 (six) hours as needed for wheezing or shortness of breath.    . amphetamine-dextroamphetamine (ADDERALL XR) 30 MG 24 hr capsule Take 30 mg by mouth daily.    . cephALEXin (KEFLEX) 500 MG capsule Take 1 capsule (500 mg total) by mouth 3 (three) times daily. Begin 2 days before next Urology surgery 12 capsule 0  . cetirizine (ZYRTEC) 10 MG tablet Take 10 mg by mouth daily.    . diphenhydrAMINE (BENADRYL) 25 MG tablet Take 25 mg by mouth every 6 (six) hours as needed for allergies.     Marland Kitchen FLUoxetine (PROZAC) 20 MG capsule Take 60 mg by mouth daily.     . fluticasone furoate-vilanterol (BREO ELLIPTA) 200-25 MCG/INH AEPB Inhale 1 puff into the lungs daily.    Marland Kitchen gabapentin (NEURONTIN) 300 MG capsule Take 300 mg by mouth 2 (two) times daily.    Marland Kitchen ibuprofen (ADVIL,MOTRIN) 200 MG tablet Take 600 mg by mouth every 6 (six) hours as needed for headache or moderate pain.    Marland Kitchen lisinopril (PRINIVIL,ZESTRIL) 20 MG tablet Take 20 mg by mouth daily.    . meloxicam (MOBIC) 7.5 MG tablet Take 7.5 mg by mouth 2 (two) times daily.    . metFORMIN (GLUCOPHAGE) 500 MG tablet Take 500 mg by mouth 2 (two) times daily with a  meal.     . montelukast (SINGULAIR) 10 MG tablet Take 10 mg by mouth daily.    . Multiple Vitamins-Minerals (PRESERVISION AREDS 2) CAPS Take 1 tablet by mouth 2 (two) times daily.    Marland Kitchen omeprazole (PRILOSEC) 40 MG capsule Take 40 mg by mouth daily.    Marland Kitchen oxybutynin (DITROPAN) 5 MG tablet Take 1 tablet (5 mg total) by mouth every 8 (eight) hours as needed for bladder spasms. And stent discomfort. 90 tablet 1  . phenytoin (DILANTIN) 100 MG ER capsule Take 300 mg by mouth daily.    . simvastatin (ZOCOR) 40 MG tablet Take 40 mg by mouth daily.    Marland Kitchen tiotropium (SPIRIVA HANDIHALER) 18 MCG inhalation capsule Place 18 mcg into inhaler and inhale daily.    Marland Kitchen oxyCODONE (OXY IR/ROXICODONE) 5 MG immediate release tablet Take 1 tablet (5 mg total) by mouth every 4  (four) hours as needed for moderate pain. (Patient not taking: Reported on 11/27/2016) 30 tablet 0    Results for orders placed or performed during the hospital encounter of 12/05/16 (from the past 48 hour(s))  Glucose, capillary     Status: Abnormal   Collection Time: 12/05/16  6:09 AM  Result Value Ref Range   Glucose-Capillary 140 (H) 65 - 99 mg/dL   No results found.  Review of Systems  Constitutional: Negative.  Negative for chills and fever.  HENT: Negative.   Eyes: Negative.   Respiratory: Negative.   Cardiovascular: Negative.   Gastrointestinal: Negative.   Genitourinary: Positive for frequency and urgency.  Musculoskeletal: Negative.   Skin: Negative.   Neurological: Negative.   Endo/Heme/Allergies: Negative.   Psychiatric/Behavioral: Negative.     Blood pressure 127/70, pulse 82, temperature 97.9 F (36.6 C), temperature source Tympanic, resp. rate 16, height 5\' 4"  (1.626 m), weight 108.9 kg (240 lb), last menstrual period 12/06/2011, SpO2 98 %. Physical Exam  Constitutional: She is oriented to person, place, and time. She appears well-developed.  HENT:  Head: Normocephalic.  Eyes: Pupils are equal, round, and reactive to light.  Cardiovascular: Normal rate.   Respiratory: Effort normal.  GI:  Significant truncal obesity.   Genitourinary:  Genitourinary Comments: NO CVAT at present.   Musculoskeletal: Normal range of motion.  Neurological: She is alert and oriented to person, place, and time.  Skin: Skin is warm.  Psychiatric: She has a normal mood and affect. Her behavior is normal. Judgment and thought content normal.     Assessment/Plan  1 - RIGHT Ureteral Stone - proceed as planned with RIGHT ureteroscopic stone manipulation with goal of stone free. Risks, benefits, alternatives, expected peri-op course, need for possible temporary peri-op stent, need for possible staged approach discussed in detail. She voiced understanding and desire to proceed today as  planned.   Sebastian Ache, MD 12/05/2016, 6:58 AM

## 2016-12-05 NOTE — Transfer of Care (Signed)
Immediate Anesthesia Transfer of Care Note  Patient: Savannah Pena  Procedure(s) Performed: Procedure(s): CYSTOSCOPY WITH RETROGRADE PYELOGRAM, URETEROSCOPY AND STENT PLACEMENT (Right)  Patient Location: PACU  Anesthesia Type:General  Level of Consciousness: awake, alert  and oriented  Airway & Oxygen Therapy: Patient Spontanous Breathing and Patient connected to face mask oxygen  Post-op Assessment: Report given to RN and Post -op Vital signs reviewed and stable  Post vital signs: Reviewed and stable  Last Vitals:  Vitals:   12/05/16 0617  BP: 127/70  Pulse: 82  Resp: 16  Temp: 36.6 C    Last Pain:  Vitals:   12/05/16 0617  TempSrc: Tympanic  PainSc: 6          Complications: No apparent anesthesia complications

## 2016-12-05 NOTE — Brief Op Note (Signed)
12/05/2016  8:19 AM  PATIENT:  Savannah Pena  56 y.o. female  PRE-OPERATIVE DIAGNOSIS:  RIGHT URETERAL STONE  POST-OPERATIVE DIAGNOSIS:  right ureteral stone  PROCEDURE:  Procedure(s): CYSTOSCOPY WITH RETROGRADE PYELOGRAM, URETEROSCOPY AND STENT PLACEMENT (Right)  SURGEON:  Surgeon(s) and Role:    * Sebastian Ache, MD - Primary  PHYSICIAN ASSISTANT:   ASSISTANTS: none   ANESTHESIA:   general  EBL:  No intake/output data recorded.  BLOOD ADMINISTERED:none  DRAINS: none   LOCAL MEDICATIONS USED:  Amount: 0 ml  SPECIMEN:  No Specimen  DISPOSITION OF SPECIMEN:  N/A  COUNTS:  YES  TOURNIQUET:  * No tourniquets in log *  DICTATION: .Other Dictation: Dictation Number [id number]  PLAN OF CARE: Discharge to home after PACU  PATIENT DISPOSITION:  PACU - hemodynamically stable.   Delay start of Pharmacological VTE agent (>24hrs) due to surgical blood loss or risk of bleeding: yes

## 2016-12-06 NOTE — Op Note (Signed)
NAMEAZAILA, HASBERRY NO.:  [id number]  MEDICAL RECORD NO.:  [id number]  LOCATION:                               FACILITY:  [facility]  PHYSICIAN:  Sebastian Ache, MD     DATE OF BIRTH:  09-24-1961  DATE OF PROCEDURE: 12/05/2016                              OPERATIVE REPORT   PREOPERATIVE DIAGNOSIS:  Right ureteral stone, history of urosepsis.  POSTOPERATIVE DIAGNOSIS:  Interval passage of right ureteral stone.  PROCEDURE: 1. Cystoscopy, right retrograde pyelogram interpretation. 2. Right diagnostic ureteroscopy. 3. Exchange of right ureteral stent, 6 x 24 Contour, no tether.  ESTIMATED BLOOD LOSS:  Nil.  COMPLICATION:  None.  SPECIMEN:  None.  FINDINGS: 1. Unremarkable right retrograde pyelogram, single system right     kidney. 2. No evidence of intraluminal urolithiasis within the bladder, right     ureter, right kidney including systematic inspection of all the     structures x3. 3. Successful replacement of right ureteral stent, proximal in renal     pelvis and distal in urinary bladder.  Tether fashioned to the     inner thigh.  INDICATION:  Ms. Tun is a 56 year old woman with recent history of urosepsis complicated by an obstructing right ureteral stone.  She underwent ureteral stenting several weeks ago as a temporizing maneuver.  She has since cleared her infectious parameters, and she now presents for definitive stone management.  She denies obvious interval stone passage.  Informed consent was obtained and placed in the medical record.  DESCRIPTION OF PROCEDURE:  The patient being Lashuna Gunnarson and procedure being right ureteroscopic stone manipulation were confirmed.  Procedure was carried out.  Time-out was performed.  Intravenous antibiotics were administered.  General LMA anesthesia induced.  The patient was placed into a low lithotomy position.  Sterile field was created by prepping and draping the patient's vagina, introitus, and  proximal thighs using iodine.  Next, cystourethroscopy was performed using a rigid cystoscope with offset lens.  Inspection of urinary bladder revealed no diverticula, calcifications, papillary lesions.  The distal end of right ureteral stent was seen in situ.  This was grasped with cold grasper, brought out in its entirety, set aside for discard.  The right ureteral orifice was then cannulated with a 5-French end-hole catheter and right retrograde pyelogram was obtained.  Right retrograde pyelogram demonstrated a single right ureter with single-system right kidney.  There were no filling defects, narrowing, or hydroureteronephrosis seen, whatsoever.  A 0.038 ZIPwire was advanced to the level of the upper pole, set aside as a safety wire.  An 8-French feeding tube placed in the urinary bladder for pressure release, and semi-rigid ureteroscopy was performed of the distal 4/5th of the right ureter alongside as a separate Sensor working wire.  No mucosal abnormalities or stones were seen, whatsoever.  I felt that this most likely represented proximal displacement of stone, and as a goal today, it was to verify ipsilateral stone free.  The semi-rigid scope was exchanged for a 35 cm ureteral access sheath using continuous fluoroscopic guidance to the level of proximal ureter, and flexible digital ureteroscopy was performed of the proximal right ureter and systematic inspection  of the right kidney including all calices x3. There was no evidence of intraluminal stones or obstructing material, whatsoever.  In addition, retrograde pyelogram was obtained to verify inspection of all calices, which were again inspected and again no evidence of intraluminal stones seen, whatsoever.  This now was most consistent with interval passage of stone.  Given access sheath use, it was felt that brief interval stenting would be warranted.  As such, the access sheath was removed under continuous vision.  No  mucosal abnormalities were found with inspection of the entire length of right ureter once again.  A new 6 x 24 Contour-type stent was placed in a safety wire using fluoroscopic guidance.  Good proximal and distal deployments were noted, proximally in renal pelvis and distally in urinary bladder.  Tether was left in place, fashioned to the inner thigh.  The procedure was terminated.  The patient tolerated the procedure well.  No immediate periprocedural complications.  The patient was taken to the postanesthesia care in stable condition.          ______________________________ Sebastian Ache, MD     TM/MEDQ  D:  12/05/2016  T:  12/06/2016  Job:  [id number]

## 2016-12-06 NOTE — Op Note (Deleted)
  The note originally documented on this encounter has been moved the the encounter in which it belongs.  

## 2016-12-22 ENCOUNTER — Ambulatory Visit (INDEPENDENT_AMBULATORY_CARE_PROVIDER_SITE_OTHER): Payer: Medicaid Other | Admitting: Urology

## 2016-12-22 VITALS — BP 109/72 | HR 80 | Ht 64.0 in | Wt 241.8 lb

## 2016-12-22 DIAGNOSIS — N201 Calculus of ureter: Secondary | ICD-10-CM

## 2016-12-22 NOTE — Progress Notes (Signed)
12/22/2016 6:12 AM   Savannah Pena 17-Jul-1961 [id number]  Referring provider: Evelene Croon, MD [address] [place], [place] [address]  CC: F/u nephrolithiasis  HPI:  1 - Recurrent Nephrolithiasis -  Pre 2018 -  SWL x2, cystolithalopexy x1. 11/2016 - medical passage 5mm ureteral stone after stenting (ureteroscopy after confirms stone free).   2 - Medical Stone Disease -  Eval 2018: BMP (low-normal Ca 8.4), PTH, Urate - normal; Composition - pending; 24 hr urines - pending  PMH sig for obesity.  Today "Savannah Pena" is seen in f/u above. She pulled tethered ureteral stent in interval.    PMH: Past Medical History:  Diagnosis Date  . Anxiety   . Arthritis   . Asthma   . Attention deficit disorder (ADD)   . COPD (chronic obstructive pulmonary disease) (HCC)   . Diabetes mellitus without complication (HCC)   . Dyspnea    with exertion  . GERD (gastroesophageal reflux disease)   . History of kidney stones   . Hypertension   . Major depression   . Seizures (HCC)    last seizure 23 years ago  . Sleep apnea    has C-PAP, but does not currently use    Surgical History: Past Surgical History:  Procedure Laterality Date  . CESAREAN SECTION    . CYSTOSCOPY WITH RETROGRADE PYELOGRAM, URETEROSCOPY AND STENT PLACEMENT Right 12/05/2016   Procedure: CYSTOSCOPY WITH RETROGRADE PYELOGRAM, URETEROSCOPY AND STENT PLACEMENT;  Surgeon: Sebastian Ache, MD;  Location: [facility] ORS;  Service: Urology;  Laterality: Right;  . CYSTOSCOPY WITH STENT PLACEMENT Right 11/06/2016   Procedure: CYSTOSCOPY WITH STENT PLACEMENT;  Surgeon: Bjorn Pippin, MD;  Location: [facility] OR[facility];  Service: Urology;  Laterality: Right;  . FLEXOR TENDON REPAIR Right    foot  . KIDNEY STONE SURGERY      Home Medications:  Allergies as of 12/22/2016      Reactions   Fruit Extracts Nausea And Vomiting   Bananas      Medication List       Accurate as of 12/22/16  6:12 AM. Always use your most recent med list.            albuterol (2.5 MG/3ML) 0.083% nebulizer solution Commonly known as:  PROVENTIL Take 2.5 mg by nebulization every 6 (six) hours as needed for wheezing or shortness of breath.   albuterol 108 (90 Base) MCG/ACT inhaler Commonly known as:  PROVENTIL HFA;VENTOLIN HFA Inhale 2 puffs into the lungs every 4 (four) hours as needed for wheezing or shortness of breath.   amphetamine-dextroamphetamine 30 MG 24 hr capsule Commonly known as:  ADDERALL XR Take 30 mg by mouth daily.   BENADRYL 25 MG tablet Generic drug:  diphenhydrAMINE Take 25 mg by mouth every 6 (six) hours as needed for allergies.   BREO ELLIPTA 200-25 MCG/INH Aepb Generic drug:  fluticasone furoate-vilanterol Inhale 1 puff into the lungs daily.   cephALEXin 500 MG capsule Commonly known as:  KEFLEX Take 1 capsule (500 mg total) by mouth 2 (two) times daily. Begin 2 days before next Urology surgery   cetirizine 10 MG tablet Commonly known as:  ZYRTEC Take 10 mg by mouth daily.   FLUoxetine 20 MG capsule Commonly known as:  PROZAC Take 60 mg by mouth daily.   gabapentin 300 MG capsule Commonly known as:  NEURONTIN Take 300 mg by mouth 2 (two) times daily.   ibuprofen 200 MG tablet Commonly known as:  ADVIL,MOTRIN Take 600 mg by mouth every 6 (six)  hours as needed for headache or moderate pain.   lisinopril 20 MG tablet Commonly known as:  PRINIVIL,ZESTRIL Take 20 mg by mouth daily.   meloxicam 7.5 MG tablet Commonly known as:  MOBIC Take 7.5 mg by mouth 2 (two) times daily.   metFORMIN 500 MG tablet Commonly known as:  GLUCOPHAGE Take 500 mg by mouth 2 (two) times daily with a meal.   montelukast 10 MG tablet Commonly known as:  SINGULAIR Take 10 mg by mouth daily.   omeprazole 40 MG capsule Commonly known as:  PRILOSEC Take 40 mg by mouth daily.   oxybutynin 5 MG tablet Commonly known as:  DITROPAN Take 1 tablet (5 mg total) by mouth every 8 (eight) hours as needed for bladder spasms. And stent  discomfort.   oxyCODONE-acetaminophen 5-325 MG tablet Commonly known as:  ROXICET Take 1-2 tablets by mouth every 6 (six) hours as needed for moderate pain or severe pain. Post-operatively.   phenytoin 100 MG ER capsule Commonly known as:  DILANTIN Take 300 mg by mouth daily.   PRESERVISION AREDS 2 Caps Take 1 tablet by mouth 2 (two) times daily.   senna-docusate 8.6-50 MG tablet Commonly known as:  Senokot-S Take 1 tablet by mouth 2 (two) times daily. While taking strong pain meds to prevent constipation.   simvastatin 40 MG tablet Commonly known as:  ZOCOR Take 40 mg by mouth daily.   SPIRIVA HANDIHALER 18 MCG inhalation capsule Generic drug:  tiotropium Place 18 mcg into inhaler and inhale daily.       Allergies:  Allergies  Allergen Reactions  . Fruit Extracts Nausea And Vomiting    Bananas     Review of Systems  Gastrointestinal (upper)  : Negative for upper GI symptoms  Gastrointestinal (lower) : Negative for lower GI symptoms  Constitutional : Negative for symptoms  Skin: Negative for skin symptoms  Eyes: Negative for eye symptoms  Ear/Nose/Throat : Negative for Ear/Nose/Throat symptoms  Hematologic/Lymphatic: Negative for Hematologic/Lymphatic symptoms  Cardiovascular : Negative for cardiovascular symptoms  Respiratory : Negative for respiratory symptoms  Endocrine: Negative for endocrine symptoms  Musculoskeletal: Negative for musculoskeletal symptoms  Neurological: Negative for neurological symptoms  Psychologic: Negative for psychiatric symptoms   Family History: Family History  Problem Relation Age of Onset  . Prostate cancer Father   . Diabetes Father     Social History:  reports that she quit smoking about 5 weeks ago. Her smoking use included Cigarettes. She has quit using smokeless tobacco. She reports that she drinks alcohol. She reports that she does not use drugs.  Physical Exam: LMP 12/06/2011 (Approximate)     Constitutional:  Alert and oriented, No acute distress. HEENT: [place] AT, moist mucus membranes.  Trachea midline, no masses. Cardiovascular: No clubbing, cyanosis, or edema. Respiratory: Normal respiratory effort, no increased work of breathing. GI: Abdomen is soft, nontender, nondistended, no abdominal masses. Morbid truncal obesity limits exam.  GU: No CVA tenderness.  Skin: No rashes, bruises or suspicious lesions. Lymph: No cervical or inguinal adenopathy. Neurologic: Grossly intact, no focal deficits, moving all 4 extremities. Psychiatric: Normal mood and affect.  Laboratory Data: Lab Results  Component Value Date   WBC 12.6 (H) 11/07/2016   HGB 12.6 11/07/2016   HCT 35.9 11/07/2016   MCV 90.2 11/07/2016   PLT 166 11/07/2016    Lab Results  Component Value Date   CREATININE 0.58 11/07/2016    No results found for: PSA  No results found for: TESTOSTERONE  Lab Results  Component Value Date   HGBA1C 6.9 (H) 11/06/2016     Pertinent Imaging: As per HPI  Assessment & Plan:    1 - Recurrent Nephrolithiasis - now stone free. Rec yearly surveillance with KUB, RUS and consider "pre-emptive" treatment for any unilateral stone volume >1cm.   2 - Medical Stone Disease -  High risk stone former. Rec complete 24 hr urines and RTC to discuss metabolic management. 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  Unfortunately she did not recover most recent stone, but I feel progress can be made with serum and urine studies alone.  RTC with 24 hr urines.    Sebastian Ache, MD  [facility] [address], [address] [place], [place] [address] [phone]

## 2016-12-22 NOTE — Addendum Note (Signed)
Addended by: Lonna Cobb on: 12/22/2016 04:33 PM   Modules accepted: Orders

## 2017-01-12 ENCOUNTER — Other Ambulatory Visit: Payer: Medicaid Other

## 2017-01-27 ENCOUNTER — Other Ambulatory Visit: Payer: Self-pay | Admitting: Urology

## 2017-01-30 ENCOUNTER — Other Ambulatory Visit: Payer: Medicaid Other

## 2017-02-05 ENCOUNTER — Ambulatory Visit: Payer: Medicaid Other

## 2017-02-18 ENCOUNTER — Other Ambulatory Visit: Payer: Medicaid Other

## 2017-03-26 ENCOUNTER — Encounter: Payer: Self-pay | Admitting: Urology

## 2017-03-29 DIAGNOSIS — R82992 Hyperoxaluria: Secondary | ICD-10-CM | POA: Insufficient documentation

## 2017-03-29 NOTE — Progress Notes (Signed)
03/31/2017 1:26 PM   Savannah Pena [birth year] [id number]  Referring provider: Evelene Croon, MD [address] [place], [place] [address]  CC: F/u nephrolithiasis  HPI:  1 - Recurrent Nephrolithiasis -  Pre 2018 -  SWL x2, cystolithalopexy x1. 11/2016 - medical passage 5mm ureteral stone after stenting (ureteroscopy after confirms stone free).   2 - Medical Stone Disease / Hyperoxaluria / Hypocitraturia  -  Eval 2018: BMP (low-normal Ca 8.4), PTH, Urate - normal; Composition - pending; 24 hr urines - mild high oxalate (40) and low citrate  PMH sig for obesity.  Today "Savannah Pena" is seen in f/u above and discuss possible metabolic management to recurrent stones.    PMH: Past Medical History:  Diagnosis Date  . Anxiety   . Arthritis   . Asthma   . Attention deficit disorder (ADD)   . COPD (chronic obstructive pulmonary disease) (HCC)   . Diabetes mellitus without complication (HCC)   . Dyspnea    with exertion  . GERD (gastroesophageal reflux disease)   . History of kidney stones   . Hypertension   . Major depression   . Seizures (HCC)    last seizure 23 years ago  . Sleep apnea    has C-PAP, but does not currently use    Surgical History: Past Surgical History:  Procedure Laterality Date  . CESAREAN SECTION    . CYSTOSCOPY WITH RETROGRADE PYELOGRAM, URETEROSCOPY AND STENT PLACEMENT Right 12/05/2016   Procedure: CYSTOSCOPY WITH RETROGRADE PYELOGRAM, URETEROSCOPY AND STENT PLACEMENT;  Surgeon: Sebastian Ache, MD;  Location: [facility];  Service: Urology;  Laterality: Right;  . CYSTOSCOPY WITH STENT PLACEMENT Right 11/06/2016   Procedure: CYSTOSCOPY WITH STENT PLACEMENT;  Surgeon: Bjorn Pippin, MD;  Location: [facility];  Service: Urology;  Laterality: Right;  . FLEXOR TENDON REPAIR Right    foot  . KIDNEY STONE SURGERY      Home Medications:  Allergies as of 03/31/2017      Reactions   Fruit Extracts Nausea And Vomiting   Bananas      Medication List         Accurate as of 03/29/17  1:26 PM. Always use your most recent med list.          albuterol (2.5 MG/3ML) 0.083% nebulizer solution Commonly known as:  PROVENTIL Take 2.5 mg by nebulization every 6 (six) hours as needed for wheezing or shortness of breath.   albuterol 108 (90 Base) MCG/ACT inhaler Commonly known as:  PROVENTIL HFA;VENTOLIN HFA Inhale 2 puffs into the lungs every 4 (four) hours as needed for wheezing or shortness of breath.   amphetamine-dextroamphetamine 30 MG 24 hr capsule Commonly known as:  ADDERALL XR Take 30 mg by mouth daily.   BENADRYL 25 MG tablet Generic drug:  diphenhydrAMINE Take 25 mg by mouth every 6 (six) hours as needed for allergies.   BREO ELLIPTA 200-25 MCG/INH Aepb Generic drug:  fluticasone furoate-vilanterol Inhale 1 puff into the lungs daily.   cephALEXin 500 MG capsule Commonly known as:  KEFLEX Take 1 capsule (500 mg total) by mouth 2 (two) times daily. Begin 2 days before next Urology surgery   cetirizine 10 MG tablet Commonly known as:  ZYRTEC Take 10 mg by mouth daily.   FLUoxetine 20 MG capsule Commonly known as:  PROZAC Take 60 mg by mouth daily.   gabapentin 300 MG capsule Commonly known as:  NEURONTIN Take 300 mg by mouth 2 (two) times daily.   ibuprofen 200 MG tablet Commonly  known as:  ADVIL,MOTRIN Take 600 mg by mouth every 6 (six) hours as needed for headache or moderate pain.   lisinopril 20 MG tablet Commonly known as:  PRINIVIL,ZESTRIL Take 20 mg by mouth daily.   meloxicam 7.5 MG tablet Commonly known as:  MOBIC Take 7.5 mg by mouth 2 (two) times daily.   metFORMIN 500 MG tablet Commonly known as:  GLUCOPHAGE Take 500 mg by mouth 2 (two) times daily with a meal.   montelukast 10 MG tablet Commonly known as:  SINGULAIR Take 10 mg by mouth daily.   omeprazole 40 MG capsule Commonly known as:  PRILOSEC Take 40 mg by mouth daily.   oxybutynin 5 MG tablet Commonly known as:  DITROPAN Take 1 tablet (5  mg total) by mouth every 8 (eight) hours as needed for bladder spasms. And stent discomfort.   oxyCODONE-acetaminophen 5-325 MG tablet Commonly known as:  ROXICET Take 1-2 tablets by mouth every 6 (six) hours as needed for moderate pain or severe pain. Post-operatively.   phenytoin 100 MG ER capsule Commonly known as:  DILANTIN Take 300 mg by mouth daily.   PRESERVISION AREDS 2 Caps Take 1 tablet by mouth 2 (two) times daily.   senna-docusate 8.6-50 MG tablet Commonly known as:  Senokot-S Take 1 tablet by mouth 2 (two) times daily. While taking strong pain meds to prevent constipation.   simvastatin 40 MG tablet Commonly known as:  ZOCOR Take 40 mg by mouth daily.   SPIRIVA HANDIHALER 18 MCG inhalation capsule Generic drug:  tiotropium Place 18 mcg into inhaler and inhale daily.       Allergies:  Allergies  Allergen Reactions  . Fruit Extracts Nausea And Vomiting    Bananas     Review of Systems  Gastrointestinal (upper)  : Negative for upper GI symptoms  Gastrointestinal (lower) : Negative for lower GI symptoms  Constitutional : Negative for symptoms  Skin: Negative for skin symptoms  Eyes: Negative for eye symptoms  Ear/Nose/Throat : Negative for Ear/Nose/Throat symptoms  Hematologic/Lymphatic: Negative for Hematologic/Lymphatic symptoms  Cardiovascular : Negative for cardiovascular symptoms  Respiratory : Negative for respiratory symptoms  Endocrine: Negative for endocrine symptoms  Musculoskeletal: Negative for musculoskeletal symptoms  Neurological: Negative for neurological symptoms  Psychologic: Negative for psychiatric symptoms   Family History: Family History  Problem Relation Age of Onset  . Prostate cancer Father   . Diabetes Father     Social History:  reports that she quit smoking about 4 months ago. Her smoking use included Cigarettes. She has quit using smokeless tobacco. She reports that she drinks alcohol. She  reports that she does not use drugs.  Physical Exam: LMP 12/06/2011 (Approximate)   Constitutional:  Alert and oriented, No acute distress. HEENT: [place] AT, moist mucus membranes.  Trachea midline, no masses. Cardiovascular: No clubbing, cyanosis, or edema. Respiratory: Normal respiratory effort, no increased work of breathing. GI: Abdomen is soft, nontender, nondistended, no abdominal masses. Morbid truncal obesity limits exam.  GU: No CVA tenderness.  Skin: No rashes, bruises or suspicious lesions. Lymph: No cervical or inguinal adenopathy. Neurologic: Grossly intact, no focal deficits, moving all 4 extremities. Psychiatric: Normal mood and affect.  Laboratory Data: Lab Results  Component Value Date   WBC 12.6 (H) 11/07/2016   HGB 12.6 11/07/2016   HCT 35.9 11/07/2016   MCV 90.2 11/07/2016   PLT 166 11/07/2016    Lab Results  Component Value Date   CREATININE 0.58 11/07/2016    No results found  for: PSA  No results found for: TESTOSTERONE  Lab Results  Component Value Date   HGBA1C 6.9 (H) 11/06/2016     Pertinent Imaging: As per HPI  Assessment & Plan:    1 - Recurrent Nephrolithiasis - now stone free. Rec yearly surveillance with KUB, RUS and consider "pre-emptive" treatment for any unilateral stone volume >1cm.    2 - Medical Stone Disease / Hyperoxaluria / Hypocitraturia  -  rec low oxalate diet (leafy greens, cola, potatoes) and begin KCit 1080 BID meals.  RTC 1 year with KUB, RUS, BMP.   Sebastian Ache, MD  [facility] [address], [address] [place], [place] [address] [phone]

## 2017-03-31 ENCOUNTER — Ambulatory Visit (INDEPENDENT_AMBULATORY_CARE_PROVIDER_SITE_OTHER): Payer: Medicaid Other | Admitting: Urology

## 2017-03-31 ENCOUNTER — Encounter: Payer: Self-pay | Admitting: Urology

## 2017-03-31 DIAGNOSIS — N201 Calculus of ureter: Secondary | ICD-10-CM | POA: Diagnosis not present

## 2017-03-31 DIAGNOSIS — E7253 Hyperoxaluria: Secondary | ICD-10-CM | POA: Diagnosis not present

## 2017-03-31 DIAGNOSIS — R82992 Hyperoxaluria: Secondary | ICD-10-CM

## 2017-03-31 MED ORDER — POTASSIUM CITRATE ER 10 MEQ (1080 MG) PO TBCR: 10.0000 meq | EXTENDED_RELEASE_TABLET | Freq: Three times a day (TID) | ORAL | Status: DC

## 2017-03-31 MED ORDER — POTASSIUM CITRATE ER 10 MEQ (1080 MG) PO TBCR: 10.0000 meq | EXTENDED_RELEASE_TABLET | Freq: Two times a day (BID) | ORAL | 3 refills | Status: DC
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
## 2017-04-08 ENCOUNTER — Ambulatory Visit (INDEPENDENT_AMBULATORY_CARE_PROVIDER_SITE_OTHER): Payer: Medicaid Other | Admitting: Psychiatry

## 2017-04-08 ENCOUNTER — Encounter (HOSPITAL_COMMUNITY): Payer: Self-pay | Admitting: Psychiatry

## 2017-04-08 VITALS — BP 129/81 | HR 75 | Ht 64.0 in | Wt 237.0 lb

## 2017-04-08 DIAGNOSIS — Z91018 Allergy to other foods: Secondary | ICD-10-CM

## 2017-04-08 DIAGNOSIS — Z7951 Long term (current) use of inhaled steroids: Secondary | ICD-10-CM | POA: Diagnosis not present

## 2017-04-08 DIAGNOSIS — Z7984 Long term (current) use of oral hypoglycemic drugs: Secondary | ICD-10-CM | POA: Diagnosis not present

## 2017-04-08 DIAGNOSIS — N200 Calculus of kidney: Secondary | ICD-10-CM

## 2017-04-08 DIAGNOSIS — E119 Type 2 diabetes mellitus without complications: Secondary | ICD-10-CM

## 2017-04-08 DIAGNOSIS — J449 Chronic obstructive pulmonary disease, unspecified: Secondary | ICD-10-CM

## 2017-04-08 DIAGNOSIS — G40909 Epilepsy, unspecified, not intractable, without status epilepticus: Secondary | ICD-10-CM | POA: Diagnosis not present

## 2017-04-08 DIAGNOSIS — J45909 Unspecified asthma, uncomplicated: Secondary | ICD-10-CM | POA: Insufficient documentation

## 2017-04-08 DIAGNOSIS — Z79899 Other long term (current) drug therapy: Secondary | ICD-10-CM

## 2017-04-08 DIAGNOSIS — F909 Attention-deficit hyperactivity disorder, unspecified type: Secondary | ICD-10-CM

## 2017-04-08 DIAGNOSIS — F331 Major depressive disorder, recurrent, moderate: Secondary | ICD-10-CM | POA: Insufficient documentation

## 2017-04-08 DIAGNOSIS — F4310 Post-traumatic stress disorder, unspecified: Secondary | ICD-10-CM | POA: Diagnosis not present

## 2017-04-08 DIAGNOSIS — Z87891 Personal history of nicotine dependence: Secondary | ICD-10-CM

## 2017-04-08 DIAGNOSIS — H3530 Unspecified macular degeneration: Secondary | ICD-10-CM | POA: Insufficient documentation

## 2017-04-08 DIAGNOSIS — Z9109 Other allergy status, other than to drugs and biological substances: Secondary | ICD-10-CM

## 2017-04-08 DIAGNOSIS — M549 Dorsalgia, unspecified: Secondary | ICD-10-CM | POA: Diagnosis not present

## 2017-04-08 DIAGNOSIS — G8929 Other chronic pain: Secondary | ICD-10-CM | POA: Insufficient documentation

## 2017-04-08 MED ORDER — DULOXETINE HCL 30 MG PO CPEP: ORAL_CAPSULE | ORAL | 1 refills | Status: DC

## 2017-04-08 MED ORDER — LAMOTRIGINE 100 MG PO TABS: 100.0000 mg | ORAL_TABLET | Freq: Every day | ORAL | 1 refills | Status: DC

## 2017-04-08 MED ORDER — BUPROPION HCL ER (XL) 150 MG PO TB24: 150.0000 mg | ORAL_TABLET | Freq: Every day | ORAL | 1 refills | Status: DC

## 2017-04-08 MED ORDER — DULOXETINE HCL 30 MG PO CPEP
30.0000 mg | ORAL_CAPSULE | Freq: Every day | ORAL | 1 refills | Status: DC
Start: 2017-04-08 — End: 2017-04-08

## 2017-04-08 NOTE — Patient Instructions (Addendum)
1. Start duloxetine 30 mg daily for two weeks, then 60 mg daily 2. Continue lamotrigine 100 mg daily 3. Continue Wellbutrin 150 mg daily 4. Return to clinic in one month for 30 mins 5. Contact for therapy: Dr. Daisy Blossom Schneidmiller  [phone] [address] [place], [place] [address]
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
## 2017-05-06 ENCOUNTER — Ambulatory Visit (INDEPENDENT_AMBULATORY_CARE_PROVIDER_SITE_OTHER): Payer: Medicaid Other | Admitting: Psychiatry

## 2017-05-06 ENCOUNTER — Telehealth (HOSPITAL_COMMUNITY): Payer: Self-pay | Admitting: *Deleted

## 2017-05-06 VITALS — BP 135/75 | HR 68 | Ht 64.02 in | Wt 234.4 lb

## 2017-05-06 DIAGNOSIS — G8929 Other chronic pain: Secondary | ICD-10-CM

## 2017-05-06 DIAGNOSIS — F4310 Post-traumatic stress disorder, unspecified: Secondary | ICD-10-CM

## 2017-05-06 DIAGNOSIS — E119 Type 2 diabetes mellitus without complications: Secondary | ICD-10-CM | POA: Diagnosis not present

## 2017-05-06 DIAGNOSIS — F331 Major depressive disorder, recurrent, moderate: Secondary | ICD-10-CM

## 2017-05-06 DIAGNOSIS — Z79899 Other long term (current) drug therapy: Secondary | ICD-10-CM

## 2017-05-06 DIAGNOSIS — Z7951 Long term (current) use of inhaled steroids: Secondary | ICD-10-CM

## 2017-05-06 DIAGNOSIS — Z87891 Personal history of nicotine dependence: Secondary | ICD-10-CM

## 2017-05-06 DIAGNOSIS — Z7984 Long term (current) use of oral hypoglycemic drugs: Secondary | ICD-10-CM

## 2017-05-06 DIAGNOSIS — M549 Dorsalgia, unspecified: Secondary | ICD-10-CM

## 2017-05-06 DIAGNOSIS — Z833 Family history of diabetes mellitus: Secondary | ICD-10-CM

## 2017-05-06 DIAGNOSIS — J449 Chronic obstructive pulmonary disease, unspecified: Secondary | ICD-10-CM | POA: Diagnosis not present

## 2017-05-06 MED ORDER — DULOXETINE HCL 60 MG PO CPEP: 60.0000 mg | ORAL_CAPSULE | Freq: Every day | ORAL | 1 refills | Status: DC

## 2017-05-06 MED ORDER — GABAPENTIN 300 MG PO CAPS: 300.0000 mg | ORAL_CAPSULE | Freq: Three times a day (TID) | ORAL | 1 refills | Status: DC

## 2017-05-06 MED ORDER — GABAPENTIN 100 MG PO CAPS: 100.0000 mg | ORAL_CAPSULE | Freq: Three times a day (TID) | ORAL | 1 refills | Status: DC

## 2017-05-06 MED ORDER — BUPROPION HCL ER (XL) 150 MG PO TB24: 150.0000 mg | ORAL_TABLET | Freq: Every day | ORAL | 1 refills | Status: DC

## 2017-05-06 MED ORDER — LAMOTRIGINE 100 MG PO TABS: 100.0000 mg | ORAL_TABLET | Freq: Every day | ORAL | 1 refills | Status: DC

## 2017-05-06 NOTE — Patient Instructions (Addendum)
1. Continue duloxeitne 60 mg daily 2. Continue lamotrigine 100 mg daily 3. Continue Wellbutrin 150 mg daily 4. Increase gabapentin 300 mg three times a day 5. Return to clinic in one month for 30 mins 6. Contact for therapy: Dr. Daisy Blossom Schneidmiller  [phone] [address] [place], [place] [address]

## 2017-05-06 NOTE — Telephone Encounter (Signed)
Done

## 2017-05-06 NOTE — Telephone Encounter (Signed)
voice message from pharmacy, would like phone call regarding patient's meds.
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
## 2017-06-05 ENCOUNTER — Ambulatory Visit (HOSPITAL_COMMUNITY): Payer: Self-pay | Admitting: Psychiatry
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
## 2017-06-22 ENCOUNTER — Ambulatory Visit (INDEPENDENT_AMBULATORY_CARE_PROVIDER_SITE_OTHER): Payer: Medicaid Other | Admitting: Psychiatry

## 2017-06-22 VITALS — BP 133/80 | HR 77 | Ht 64.0 in | Wt 236.0 lb

## 2017-06-22 DIAGNOSIS — Z87891 Personal history of nicotine dependence: Secondary | ICD-10-CM

## 2017-06-22 DIAGNOSIS — Z79899 Other long term (current) drug therapy: Secondary | ICD-10-CM | POA: Diagnosis not present

## 2017-06-22 DIAGNOSIS — M549 Dorsalgia, unspecified: Secondary | ICD-10-CM

## 2017-06-22 DIAGNOSIS — F4310 Post-traumatic stress disorder, unspecified: Secondary | ICD-10-CM | POA: Diagnosis not present

## 2017-06-22 DIAGNOSIS — G4700 Insomnia, unspecified: Secondary | ICD-10-CM

## 2017-06-22 DIAGNOSIS — G40909 Epilepsy, unspecified, not intractable, without status epilepticus: Secondary | ICD-10-CM

## 2017-06-22 DIAGNOSIS — J449 Chronic obstructive pulmonary disease, unspecified: Secondary | ICD-10-CM

## 2017-06-22 DIAGNOSIS — F331 Major depressive disorder, recurrent, moderate: Secondary | ICD-10-CM | POA: Diagnosis not present

## 2017-06-22 DIAGNOSIS — E119 Type 2 diabetes mellitus without complications: Secondary | ICD-10-CM | POA: Diagnosis not present

## 2017-06-22 DIAGNOSIS — G8929 Other chronic pain: Secondary | ICD-10-CM | POA: Diagnosis not present

## 2017-06-22 MED ORDER — BUPROPION HCL ER (XL) 150 MG PO TB24: 150.0000 mg | ORAL_TABLET | Freq: Every day | ORAL | 1 refills | Status: DC

## 2017-06-22 MED ORDER — LAMOTRIGINE 100 MG PO TABS: 100.0000 mg | ORAL_TABLET | Freq: Every day | ORAL | 1 refills | Status: DC

## 2017-06-22 MED ORDER — GABAPENTIN 300 MG PO CAPS: 300.0000 mg | ORAL_CAPSULE | Freq: Two times a day (BID) | ORAL | 1 refills | Status: DC

## 2017-06-22 MED ORDER — DULOXETINE HCL 30 MG PO CPEP: 90.0000 mg | ORAL_CAPSULE | Freq: Every day | ORAL | 1 refills | Status: DC

## 2017-06-22 NOTE — Patient Instructions (Addendum)
1. Increase duloxetine 90 mg daily 2. Continue lamotrigine 100 mg daily 3. Continue Wellbutrin 150 mg daily 4. Increase gabapentin 300 mg twice a day 5. Return to clinic in one month for 30 mins 6. Referral to therapy

## 2017-06-26 ENCOUNTER — Ambulatory Visit (HOSPITAL_COMMUNITY): Payer: Self-pay | Admitting: Psychiatry
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
## 2017-07-21 ENCOUNTER — Encounter (HOSPITAL_COMMUNITY): Payer: Self-pay | Admitting: Psychiatry

## 2017-07-21 ENCOUNTER — Telehealth (HOSPITAL_COMMUNITY): Payer: Self-pay | Admitting: *Deleted

## 2017-07-21 ENCOUNTER — Ambulatory Visit (INDEPENDENT_AMBULATORY_CARE_PROVIDER_SITE_OTHER): Payer: Medicaid Other | Admitting: Psychiatry

## 2017-07-21 VITALS — BP 135/83 | HR 78 | Ht 64.0 in | Wt 237.2 lb

## 2017-07-21 DIAGNOSIS — E119 Type 2 diabetes mellitus without complications: Secondary | ICD-10-CM

## 2017-07-21 DIAGNOSIS — F331 Major depressive disorder, recurrent, moderate: Secondary | ICD-10-CM

## 2017-07-21 DIAGNOSIS — F419 Anxiety disorder, unspecified: Secondary | ICD-10-CM | POA: Diagnosis not present

## 2017-07-21 DIAGNOSIS — F4310 Post-traumatic stress disorder, unspecified: Secondary | ICD-10-CM

## 2017-07-21 DIAGNOSIS — R450 Nervousness: Secondary | ICD-10-CM

## 2017-07-21 DIAGNOSIS — Z87891 Personal history of nicotine dependence: Secondary | ICD-10-CM | POA: Diagnosis not present

## 2017-07-21 DIAGNOSIS — F909 Attention-deficit hyperactivity disorder, unspecified type: Secondary | ICD-10-CM | POA: Diagnosis not present

## 2017-07-21 DIAGNOSIS — M549 Dorsalgia, unspecified: Secondary | ICD-10-CM

## 2017-07-21 DIAGNOSIS — J449 Chronic obstructive pulmonary disease, unspecified: Secondary | ICD-10-CM

## 2017-07-21 DIAGNOSIS — G4700 Insomnia, unspecified: Secondary | ICD-10-CM | POA: Diagnosis not present

## 2017-07-21 MED ORDER — GABAPENTIN 300 MG PO CAPS: 300.0000 mg | ORAL_CAPSULE | Freq: Two times a day (BID) | ORAL | 1 refills | Status: DC

## 2017-07-21 MED ORDER — DULOXETINE HCL 30 MG PO CPEP: 90.0000 mg | ORAL_CAPSULE | Freq: Every day | ORAL | 1 refills | Status: DC

## 2017-07-21 MED ORDER — BUPROPION HCL ER (XL) 150 MG PO TB24: 150.0000 mg | ORAL_TABLET | Freq: Every day | ORAL | 1 refills | Status: DC

## 2017-07-21 MED ORDER — LAMOTRIGINE 100 MG PO TABS: 50.0000 mg | ORAL_TABLET | Freq: Every day | ORAL | 1 refills | Status: DC

## 2017-07-21 NOTE — Patient Instructions (Signed)
1. Continue duloxetine 90 mg daily 2. Continue wellbutrin 150 mg daily 3. Decrease lamotrigine 50 mg daily 4. Continue gabapentin 300 mg twice a day 5. Return to clinic in one month for 30 mins

## 2017-07-21 NOTE — Telephone Encounter (Signed)
noted 

## 2017-07-21 NOTE — Telephone Encounter (Signed)
Pt came into office to see another provider. Per pt, she would like to see if she could talk to her therapist for a few minutes before she leave. Per pt she needs to discuss something about another pt that sees him and per pt she have a release on file in his chart to speak with Josh. Reminded pt that staff was unable to speak about another pt but staff will send the message to provider stating she would like to talk with him. Per pt, her appt today with her other provider is only for 30 mins and if staff could see if the message could be put back to give Josh enough time to see it and speak with her. Staff agreed that message will be put back to provider. Staff informed pt that provider was currently seeing patients and pt stated okay. Per pt who provided staff with the name of the pt she wants to talk about. Per pt his name is Leonia Reeves and he is not doing well and she needs to talk with Josh. Per pt it is very important that she speak with Josh before she leaves the office today. Pt number is [phone].

## 2017-07-21 NOTE — Telephone Encounter (Signed)
I met with pt and let her share her feelings/vent for 15 mins. I did not provide any feedback, etc about my patient that she had concerns with.
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
## 2017-08-18 ENCOUNTER — Telehealth (HOSPITAL_COMMUNITY): Payer: Self-pay | Admitting: Psychiatry

## 2017-08-20 ENCOUNTER — Ambulatory Visit (HOSPITAL_COMMUNITY): Payer: Self-pay | Admitting: Psychiatry

## 2017-09-16 ENCOUNTER — Telehealth (HOSPITAL_COMMUNITY): Payer: Self-pay | Admitting: *Deleted

## 2017-09-16 NOTE — Telephone Encounter (Signed)
Pt pharmacy [facility] in [place] requesting refills for pt Gabapentin 300 BID. Per pt chart, pt medication was last filled on 07-21-2017 with 60 tabs 1 refill. Pt pharmacy number is [phone]. Pharmacy is also requesting refills for pt Bupropion HCL XL 150 mg QD. This medication was also last given 30 tabs 1 refill on 07-21-2017.

## 2017-09-16 NOTE — Telephone Encounter (Signed)
Pt pharmacy [facility] in [place] requesting refills for pt Savannah Pena 100 mg QD. Per pt chart, pt medication was last filled on 07-21-2017 with 15 tabs 1 refill. Pt pharmacy number is [phone].

## 2017-09-17 NOTE — Telephone Encounter (Signed)
Called pt and lmtcb to sch f/u appt. Staff unable to reach pt and office number was provided on pt voicemail.

## 2017-09-17 NOTE — Telephone Encounter (Signed)
She needs an appointment.

## 2017-09-17 NOTE — Telephone Encounter (Signed)
It was ordered as 50 mg daily. She also needs an appointment.

## 2017-09-24 ENCOUNTER — Telehealth (HOSPITAL_COMMUNITY): Payer: Self-pay | Admitting: Psychiatry

## 2017-09-24 NOTE — Telephone Encounter (Signed)
Received a request for refill of medication. Please inform the patient that refill will not be made without evaluation (she was instructed to have follow up in October).

## 2017-09-24 NOTE — Telephone Encounter (Signed)
LVM asking for return call

## 2018-01-05 ENCOUNTER — Encounter: Payer: Medicaid Other | Attending: Psychology | Admitting: Psychology

## 2018-01-05 DIAGNOSIS — F331 Major depressive disorder, recurrent, moderate: Secondary | ICD-10-CM

## 2018-01-05 DIAGNOSIS — F909 Attention-deficit hyperactivity disorder, unspecified type: Secondary | ICD-10-CM | POA: Diagnosis present

## 2018-01-05 DIAGNOSIS — F4310 Post-traumatic stress disorder, unspecified: Secondary | ICD-10-CM | POA: Diagnosis not present

## 2018-01-05 DIAGNOSIS — J449 Chronic obstructive pulmonary disease, unspecified: Secondary | ICD-10-CM | POA: Diagnosis not present

## 2018-01-05 DIAGNOSIS — E119 Type 2 diabetes mellitus without complications: Secondary | ICD-10-CM | POA: Insufficient documentation

## 2018-01-05 DIAGNOSIS — F338 Other recurrent depressive disorders: Secondary | ICD-10-CM | POA: Diagnosis not present

## 2018-01-05 DIAGNOSIS — K219 Gastro-esophageal reflux disease without esophagitis: Secondary | ICD-10-CM | POA: Insufficient documentation

## 2018-01-23 DIAGNOSIS — E785 Hyperlipidemia, unspecified: Secondary | ICD-10-CM | POA: Diagnosis not present

## 2018-01-23 DIAGNOSIS — E1140 Type 2 diabetes mellitus with diabetic neuropathy, unspecified: Secondary | ICD-10-CM | POA: Diagnosis not present

## 2018-01-23 DIAGNOSIS — I10 Essential (primary) hypertension: Secondary | ICD-10-CM | POA: Diagnosis not present

## 2018-01-23 DIAGNOSIS — J449 Chronic obstructive pulmonary disease, unspecified: Secondary | ICD-10-CM | POA: Diagnosis not present

## 2018-01-23 DIAGNOSIS — E11621 Type 2 diabetes mellitus with foot ulcer: Secondary | ICD-10-CM | POA: Diagnosis not present

## 2018-01-23 DIAGNOSIS — B353 Tinea pedis: Secondary | ICD-10-CM | POA: Diagnosis not present

## 2018-01-23 DIAGNOSIS — J4520 Mild intermittent asthma, uncomplicated: Secondary | ICD-10-CM | POA: Diagnosis not present

## 2018-01-24 IMAGING — CT CT RENAL STONE PROTOCOL
2 of 4 series · 16 of 46 positions shown, 18 images · non-contrast
Comparison: None.

CLINICAL DATA: Acute onset of right flank pain and nausea. Initial
encounter.

EXAM:
CT ABDOMEN AND PELVIS WITHOUT CONTRAST
TECHNIQUE: Multidetector CT imaging of the abdomen and pelvis was performed
following the standard protocol without IV contrast.

[Series 2: stone full standard · axial · 0.89mm/px · z∈[-387,+68]mm · 13 of 101 slices shown, 15 images]
[im 5/101  soft-tissue]
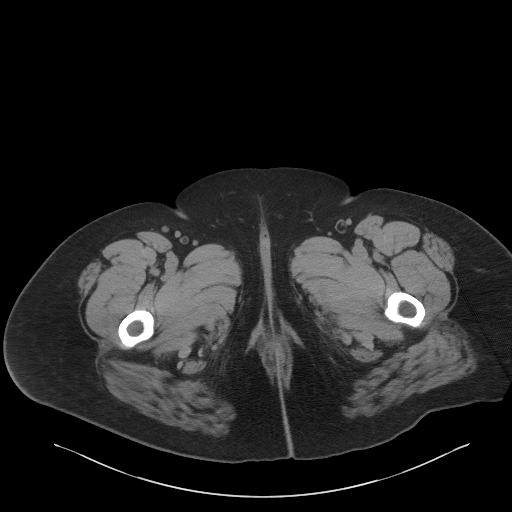
[im 5/101  bone]
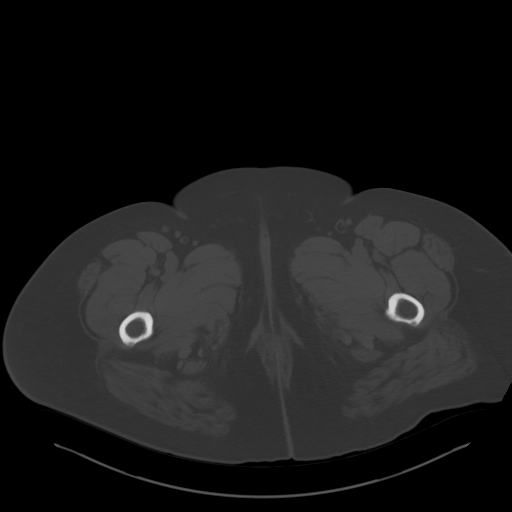
[im 14/101  soft-tissue]
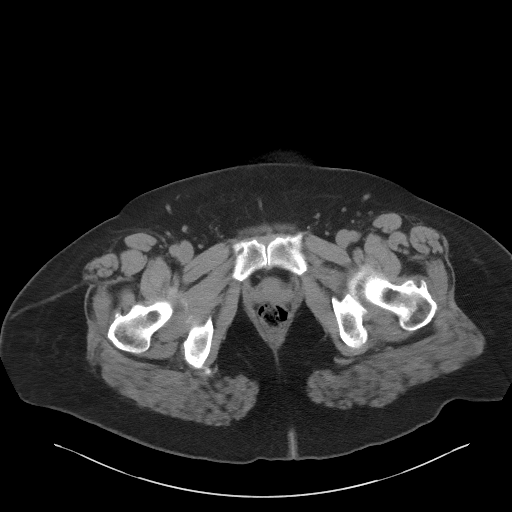
[im 22/101  soft-tissue]
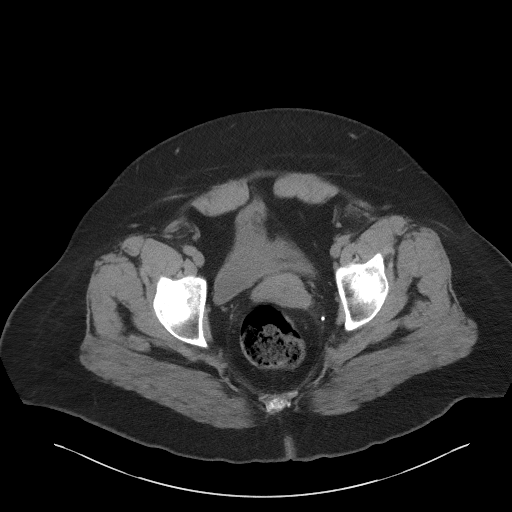
[im 27/101  soft-tissue]
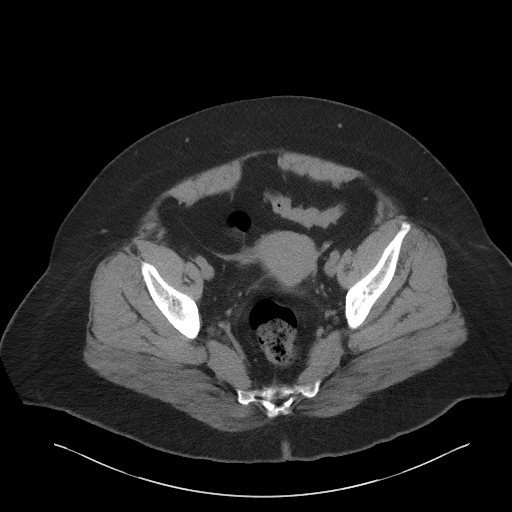
[im 35/101  soft-tissue]
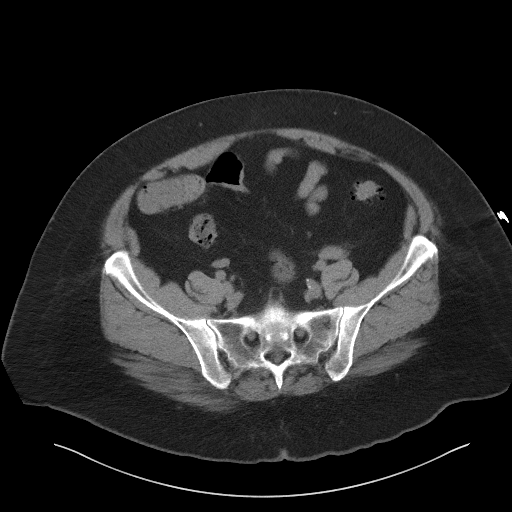
[im 44/101  soft-tissue]
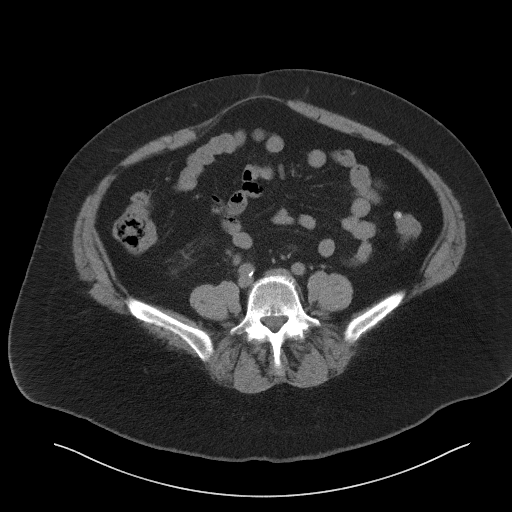
[im 53/101  soft-tissue]
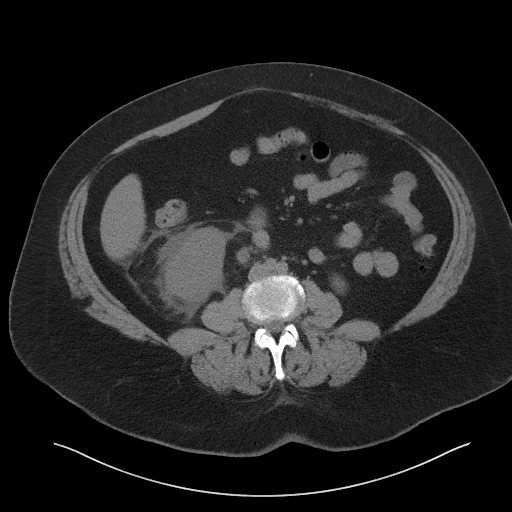
[im 57/101  soft-tissue]
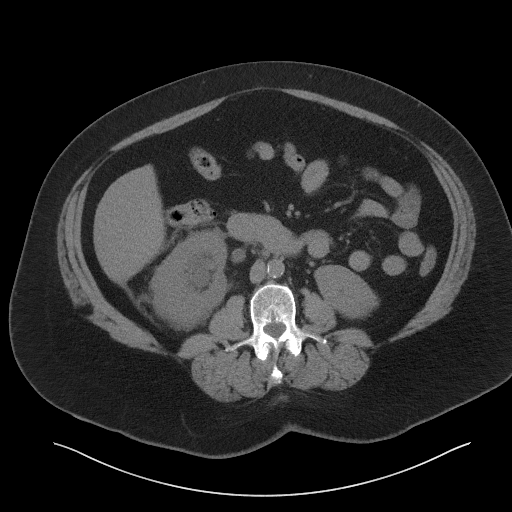
[im 66/101  soft-tissue]
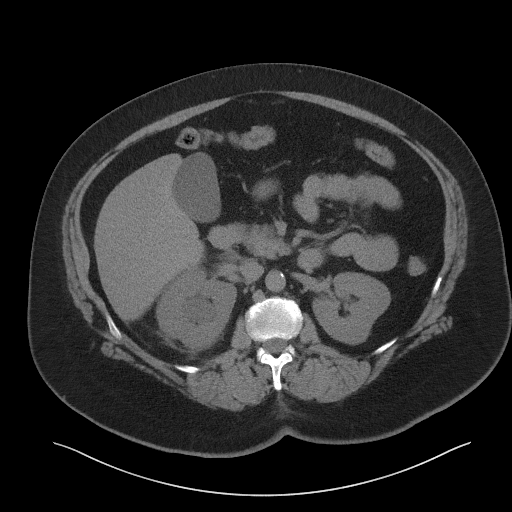
[im 66/101  bone]
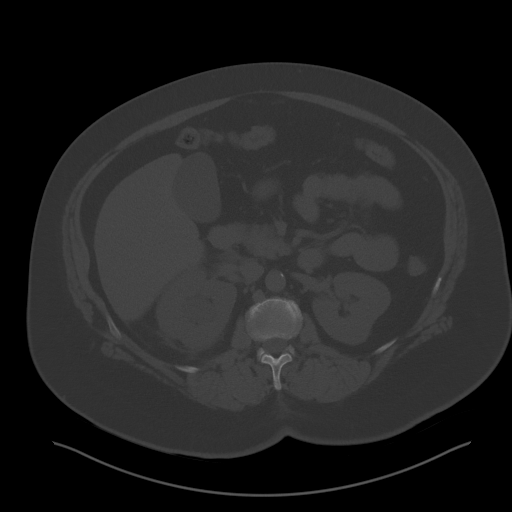
[im 74/101  soft-tissue]
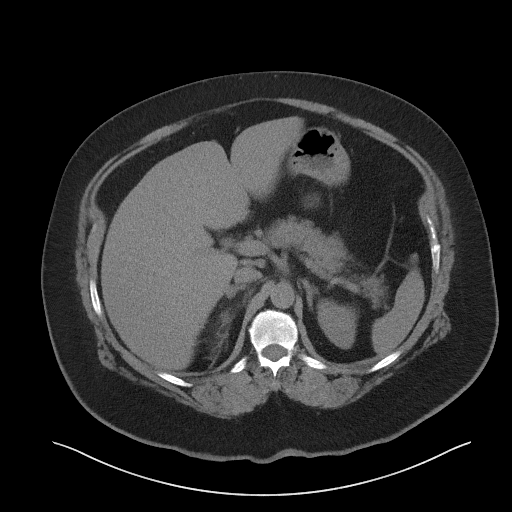
[im 79/101  soft-tissue]
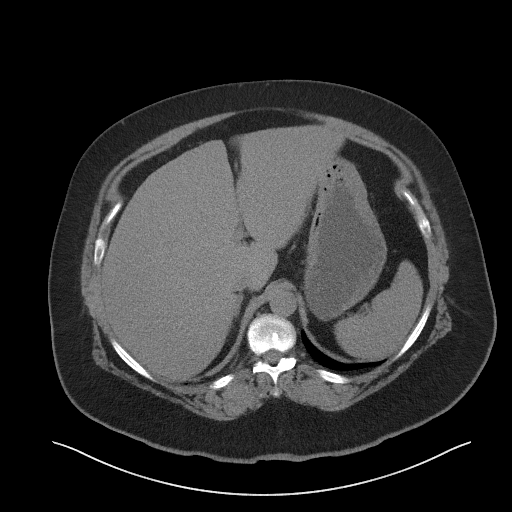
[im 87/101  soft-tissue]
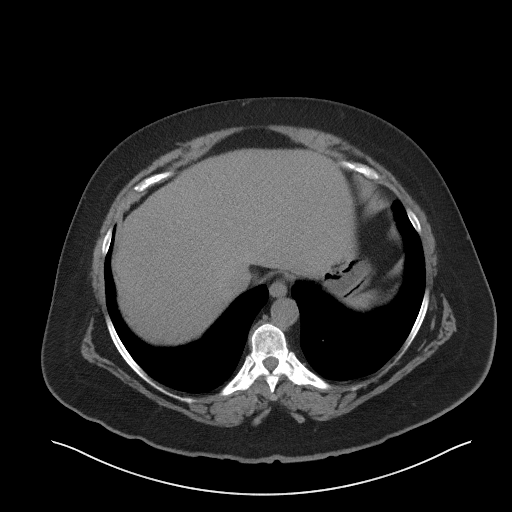
[im 96/101  soft-tissue]
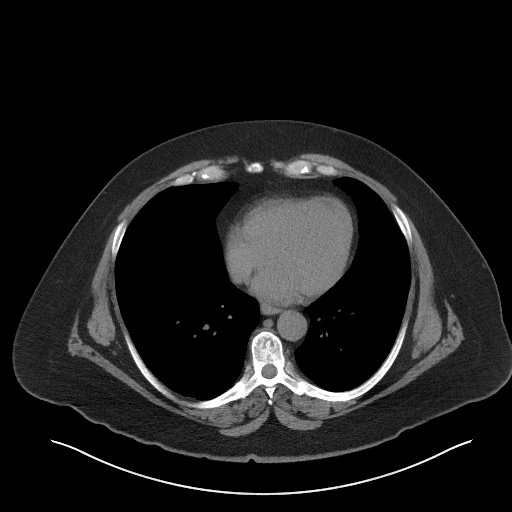

[Series 5: coronal · coronal · 0.91mm/px · 3 of 166 slices shown]
[im 56/166  soft-tissue]
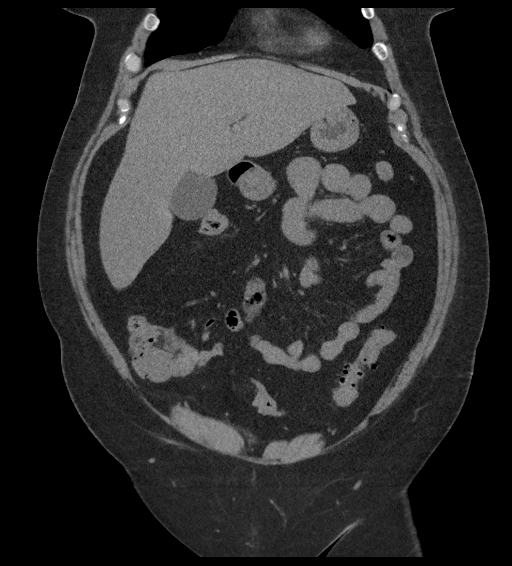
[im 74/166  soft-tissue]
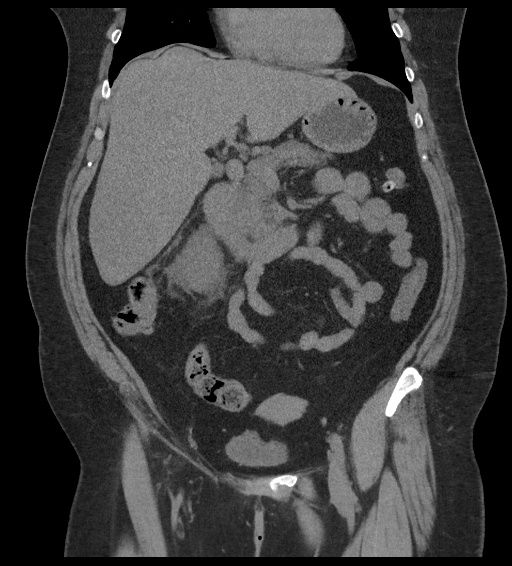
[im 92/166  soft-tissue]
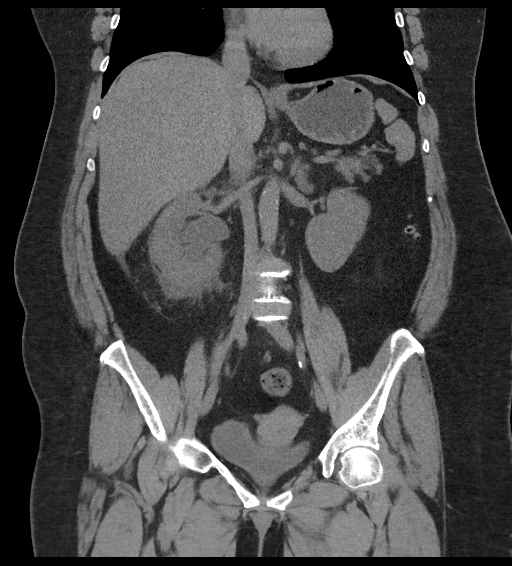

[16 of 46 positions shown; findings below may reference images not displayed]

FINDINGS: Lower chest: The visualized lung bases are grossly clear. The
visualized portions of the mediastinum are unremarkable.

Hepatobiliary: The liver is unremarkable in appearance. The
gallbladder is unremarkable in appearance. The common bile duct
remains normal in caliber.

Pancreas: The pancreas is within normal limits.

Spleen: The spleen is unremarkable in appearance.

Adrenals/Urinary Tract: The adrenal glands are unremarkable in
appearance.

Moderate right-sided hydronephrosis is noted, with right-sided
perinephric stranding and fluid. An obstructing 1.0 x 0.5 cm stone
is noted at the mid right ureter, 8 cm below the right renal pelvis.

A small nonobstructing 5 mm stone is suggested at the interpole
region of the right kidney.

Stomach/Bowel: The stomach is unremarkable in appearance. The small
bowel is within normal limits. The appendix is not visualized; there
is no evidence for appendicitis.

Mild scattered diverticulosis is noted along the entirety of the
colon, without evidence of diverticulitis.

Vascular/Lymphatic: Scattered calcification is seen along the
abdominal aorta and its branches. The abdominal aorta is otherwise
grossly unremarkable. The inferior vena cava is grossly
unremarkable. No retroperitoneal lymphadenopathy is seen. No pelvic
sidewall lymphadenopathy is identified.

Reproductive: The bladder is mildly distended and within normal
limits. The uterus is grossly unremarkable in appearance. The
ovaries are relatively symmetric. No suspicious adnexal masses are
seen.

Other: No additional soft tissue abnormalities are seen.

Musculoskeletal: No acute osseous abnormalities are identified. The
visualized musculature is unremarkable in appearance.
IMPRESSION: 1. Moderate right-sided hydronephrosis, with right-sided perinephric
stranding and fluid. Obstructing 1.0 x 0.5 cm stone at the mid right
ureter, 8 cm below the right renal pelvis.
2. Small nonobstructing 5 mm stone suggested at the interpole region
of the right kidney.
3. Mild scattered diverticulosis along the entirety of the colon,
without evidence of diverticulitis.
4. Scattered aortic atherosclerosis.

## 2018-03-12 ENCOUNTER — Encounter: Payer: Self-pay | Admitting: Psychology

## 2018-03-12 ENCOUNTER — Ambulatory Visit: Payer: Self-pay | Admitting: Psychology
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
## 2018-03-15 ENCOUNTER — Encounter (HOSPITAL_COMMUNITY): Payer: Self-pay | Admitting: Emergency Medicine

## 2018-03-15 ENCOUNTER — Emergency Department (HOSPITAL_COMMUNITY): Payer: Medicaid Other

## 2018-03-15 ENCOUNTER — Other Ambulatory Visit: Payer: Self-pay

## 2018-03-15 ENCOUNTER — Ambulatory Visit: Payer: Self-pay | Admitting: Psychology

## 2018-03-15 ENCOUNTER — Inpatient Hospital Stay (HOSPITAL_COMMUNITY)
Admission: EM | Admit: 2018-03-15 | Discharge: 2018-03-18 | DRG: 287 | Disposition: A | Discharge status: Home / Self Care | Payer: Medicaid Other | Attending: Internal Medicine | Admitting: Internal Medicine

## 2018-03-15 DIAGNOSIS — T420X6A Underdosing of hydantoin derivatives, initial encounter: Secondary | ICD-10-CM | POA: Diagnosis present

## 2018-03-15 DIAGNOSIS — I2510 Atherosclerotic heart disease of native coronary artery without angina pectoris: Secondary | ICD-10-CM | POA: Diagnosis present

## 2018-03-15 DIAGNOSIS — I5181 Takotsubo syndrome: Principal | ICD-10-CM

## 2018-03-15 DIAGNOSIS — I248 Other forms of acute ischemic heart disease: Secondary | ICD-10-CM | POA: Diagnosis present

## 2018-03-15 DIAGNOSIS — F988 Other specified behavioral and emotional disorders with onset usually occurring in childhood and adolescence: Secondary | ICD-10-CM | POA: Diagnosis present

## 2018-03-15 DIAGNOSIS — I110 Hypertensive heart disease with heart failure: Secondary | ICD-10-CM | POA: Diagnosis present

## 2018-03-15 DIAGNOSIS — Z87891 Personal history of nicotine dependence: Secondary | ICD-10-CM

## 2018-03-15 DIAGNOSIS — F331 Major depressive disorder, recurrent, moderate: Secondary | ICD-10-CM | POA: Diagnosis present

## 2018-03-15 DIAGNOSIS — Z91018 Allergy to other foods: Secondary | ICD-10-CM

## 2018-03-15 DIAGNOSIS — R000 Tachycardia, unspecified: Secondary | ICD-10-CM | POA: Diagnosis present

## 2018-03-15 DIAGNOSIS — J449 Chronic obstructive pulmonary disease, unspecified: Secondary | ICD-10-CM | POA: Diagnosis present

## 2018-03-15 DIAGNOSIS — I214 Non-ST elevation (NSTEMI) myocardial infarction: Secondary | ICD-10-CM | POA: Diagnosis present

## 2018-03-15 DIAGNOSIS — H3530 Unspecified macular degeneration: Secondary | ICD-10-CM | POA: Diagnosis present

## 2018-03-15 DIAGNOSIS — R569 Unspecified convulsions: Secondary | ICD-10-CM

## 2018-03-15 DIAGNOSIS — F4310 Post-traumatic stress disorder, unspecified: Secondary | ICD-10-CM | POA: Diagnosis present

## 2018-03-15 DIAGNOSIS — E119 Type 2 diabetes mellitus without complications: Secondary | ICD-10-CM

## 2018-03-15 DIAGNOSIS — K219 Gastro-esophageal reflux disease without esophagitis: Secondary | ICD-10-CM | POA: Diagnosis present

## 2018-03-15 DIAGNOSIS — Z87442 Personal history of urinary calculi: Secondary | ICD-10-CM

## 2018-03-15 DIAGNOSIS — E785 Hyperlipidemia, unspecified: Secondary | ICD-10-CM | POA: Diagnosis present

## 2018-03-15 DIAGNOSIS — Z7951 Long term (current) use of inhaled steroids: Secondary | ICD-10-CM

## 2018-03-15 DIAGNOSIS — J4489 Other specified chronic obstructive pulmonary disease: Secondary | ICD-10-CM | POA: Diagnosis present

## 2018-03-15 DIAGNOSIS — R778 Other specified abnormalities of plasma proteins: Secondary | ICD-10-CM

## 2018-03-15 DIAGNOSIS — Z9119 Patient's noncompliance with other medical treatment and regimen: Secondary | ICD-10-CM

## 2018-03-15 DIAGNOSIS — G8929 Other chronic pain: Secondary | ICD-10-CM | POA: Diagnosis present

## 2018-03-15 DIAGNOSIS — Z833 Family history of diabetes mellitus: Secondary | ICD-10-CM

## 2018-03-15 DIAGNOSIS — R7989 Other specified abnormal findings of blood chemistry: Secondary | ICD-10-CM | POA: Diagnosis not present

## 2018-03-15 DIAGNOSIS — F909 Attention-deficit hyperactivity disorder, unspecified type: Secondary | ICD-10-CM | POA: Diagnosis present

## 2018-03-15 DIAGNOSIS — Z960 Presence of urogenital implants: Secondary | ICD-10-CM | POA: Diagnosis present

## 2018-03-15 DIAGNOSIS — Z79899 Other long term (current) drug therapy: Secondary | ICD-10-CM

## 2018-03-15 DIAGNOSIS — Z6841 Body Mass Index (BMI) 40.0 and over, adult: Secondary | ICD-10-CM

## 2018-03-15 DIAGNOSIS — F329 Major depressive disorder, single episode, unspecified: Secondary | ICD-10-CM | POA: Diagnosis present

## 2018-03-15 DIAGNOSIS — R0789 Other chest pain: Secondary | ICD-10-CM | POA: Diagnosis not present

## 2018-03-15 DIAGNOSIS — Z91128 Patient's intentional underdosing of medication regimen for other reason: Secondary | ICD-10-CM

## 2018-03-15 DIAGNOSIS — I428 Other cardiomyopathies: Secondary | ICD-10-CM

## 2018-03-15 DIAGNOSIS — I429 Cardiomyopathy, unspecified: Secondary | ICD-10-CM

## 2018-03-15 DIAGNOSIS — F419 Anxiety disorder, unspecified: Secondary | ICD-10-CM | POA: Diagnosis present

## 2018-03-15 DIAGNOSIS — E7800 Pure hypercholesterolemia, unspecified: Secondary | ICD-10-CM

## 2018-03-15 DIAGNOSIS — J45909 Unspecified asthma, uncomplicated: Secondary | ICD-10-CM | POA: Diagnosis present

## 2018-03-15 DIAGNOSIS — G40909 Epilepsy, unspecified, not intractable, without status epilepticus: Secondary | ICD-10-CM | POA: Diagnosis present

## 2018-03-15 DIAGNOSIS — Z7984 Long term (current) use of oral hypoglycemic drugs: Secondary | ICD-10-CM

## 2018-03-15 DIAGNOSIS — I509 Heart failure, unspecified: Secondary | ICD-10-CM | POA: Diagnosis present

## 2018-03-15 DIAGNOSIS — M549 Dorsalgia, unspecified: Secondary | ICD-10-CM | POA: Diagnosis present

## 2018-03-15 DIAGNOSIS — G4730 Sleep apnea, unspecified: Secondary | ICD-10-CM | POA: Diagnosis present

## 2018-03-15 DIAGNOSIS — R0602 Shortness of breath: Secondary | ICD-10-CM | POA: Diagnosis not present

## 2018-03-15 LAB — CBC WITH DIFFERENTIAL/PLATELET
BASOS ABS: 0 10*3/uL (ref 0.0–0.1)
BASOS PCT: 0 %
Eosinophils Absolute: 0.8 10*3/uL — ABNORMAL HIGH (ref 0.0–0.7)
Eosinophils Relative: 5 %
HEMATOCRIT: 41.7 % (ref 36.0–46.0)
HEMOGLOBIN: 14.5 g/dL (ref 12.0–15.0)
LYMPHS PCT: 12 %
Lymphs Abs: 2.2 10*3/uL (ref 0.7–4.0)
MCH: 31.5 pg (ref 26.0–34.0)
MCHC: 34.8 g/dL (ref 30.0–36.0)
MCV: 90.7 fL (ref 78.0–100.0)
Monocytes Absolute: 1 10*3/uL (ref 0.1–1.0)
Monocytes Relative: 5 %
NEUTROS ABS: 14.8 10*3/uL — AB (ref 1.7–7.7)
Neutrophils Relative %: 78 %
Platelets: 308 10*3/uL (ref 150–400)
RBC: 4.6 MIL/uL (ref 3.87–5.11)
RDW: 12.7 % (ref 11.5–15.5)
WBC: 18.8 10*3/uL — AB (ref 4.0–10.5)

## 2018-03-15 LAB — COMPREHENSIVE METABOLIC PANEL
ALBUMIN: 4.1 g/dL (ref 3.5–5.0)
ALK PHOS: 91 U/L (ref 38–126)
ALT: 30 U/L (ref 14–54)
AST: 19 U/L (ref 15–41)
Anion gap: 11 (ref 5–15)
BILIRUBIN TOTAL: 0.5 mg/dL (ref 0.3–1.2)
BUN: 14 mg/dL (ref 6–20)
CALCIUM: 9.5 mg/dL (ref 8.9–10.3)
CO2: 25 mmol/L (ref 22–32)
CREATININE: 0.69 mg/dL (ref 0.44–1.00)
Chloride: 104 mmol/L (ref 101–111)
GFR calc Af Amer: >60 mL/min (ref >60)
GFR calc non Af Amer: >60 mL/min (ref >60)
Glucose, Bld: 189 mg/dL — ABNORMAL HIGH (ref 65–99)
Potassium: 3.9 mmol/L (ref 3.5–5.1)
Sodium: 140 mmol/L (ref 135–145)
TOTAL PROTEIN: 7.5 g/dL (ref 6.5–8.1)

## 2018-03-15 LAB — D-DIMER, QUANTITATIVE: D-Dimer, Quant: 0.49 ug/mL-FEU (ref 0.00–0.50)

## 2018-03-15 LAB — PHENYTOIN LEVEL, TOTAL: Phenytoin Lvl: <2.5 ug/mL — ABNORMAL LOW (ref 10.0–20.0)

## 2018-03-15 LAB — ACETAMINOPHEN LEVEL: Acetaminophen (Tylenol), Serum: <10 ug/mL — ABNORMAL LOW (ref 10–30)

## 2018-03-15 LAB — TROPONIN I: TROPONIN I: 0.19 ng/mL — AB (ref <0.03)

## 2018-03-15 LAB — ETHANOL: Alcohol, Ethyl (B): <10 mg/dL (ref <10)

## 2018-03-15 LAB — CBG MONITORING, ED: GLUCOSE-CAPILLARY: 217 mg/dL — AB (ref 65–99)

## 2018-03-15 LAB — SALICYLATE LEVEL: Salicylate Lvl: <7 mg/dL (ref 2.8–30.0)

## 2018-03-15 MED ORDER — ASPIRIN 81 MG PO CHEW
324.0000 mg | CHEWABLE_TABLET | Freq: Once | ORAL | Status: AC
  Administered 2018-03-15: 324 mg via ORAL
  Filled 2018-03-15: qty 4

## 2018-03-15 MED ORDER — SODIUM CHLORIDE 0.9 % IV BOLUS
1000.0000 mL | Freq: Once | INTRAVENOUS | Status: AC
  Administered 2018-03-15: 1000 mL via INTRAVENOUS

## 2018-03-15 MED ORDER — METHYLPREDNISOLONE SODIUM SUCC 125 MG IJ SOLR
125.0000 mg | Freq: Once | INTRAMUSCULAR | Status: AC
  Administered 2018-03-15: 125 mg via INTRAVENOUS
  Filled 2018-03-15: qty 2

## 2018-03-15 NOTE — ED Notes (Signed)
Pt has taken several albuterol nebulizer treatments today. Pt said she doesn't remember how many but she used it all day.

## 2018-03-15 NOTE — ED Notes (Signed)
Date and time results received: 03/15/18 2232   Test: Troponin Critical Value: 0.19  Name of Provider Notified: EDP Charm Barges  Orders Received? Or Actions Taken?: n/a

## 2018-03-15 NOTE — ED Provider Notes (Signed)
[facility] EMERGENCY DEPARTMENT Provider Note   CSN: [id number] Arrival date & time: 03/15/18  2105     History   Chief Complaint Chief Complaint  Patient presents with  . Seizures    HPI Savannah Pena is a 57 y.o. female.  Patient presents via EMS with witnessed tonic-clonic seizure activity by her family.  The family reports this was just after dinner she was sitting on a barstool and she began shaking in her left arm that progressed to her entire body.  Her significant other lowered her to the ground but she did not hit her head or lose consciousness.  Seizure activity abated by the time EMS arrived approximately 10 minutes later.  Patient does have a history of seizures and states she has been compliant with her Dilantin.  Last seizure was 25 years ago by her report.  Patient was found to be tachycardic and short of breath by EMS.  She does have history of COPD and asthma and has been using her  nebulizer several times today.  She states she does not use it every day.  She is had "allergies" with nonproductive cough and rhinorrhea.  She denies chest pain but complains of tightness.  No abdominal pain, nausea, vomiting or fever.  No illicit drug or alcohol use.  There is no tongue biting or incontinence.  Patient did have an episode of transient confusion which has since resolved.  The history is provided by the patient and the EMS personnel.  Seizures   Associated symptoms include cough. Pertinent negatives include no headaches, no visual disturbance, no chest pain, no nausea and no vomiting.    Past Medical History:  Diagnosis Date  . Anxiety   . Arthritis   . Asthma   . Attention deficit disorder (ADD)   . COPD (chronic obstructive pulmonary disease) (HCC)   . Diabetes mellitus without complication (HCC)   . Dyspnea    with exertion  . GERD (gastroesophageal reflux disease)   . History of kidney stones   . Hypertension   . Major depression   . Seizures (HCC)    last  seizure 23 years ago  . Sleep apnea    has C-PAP, but does not currently use    Patient Active Problem List   Diagnosis Date Noted  . Diabetes (HCC) 04/08/2017  . Asthma 04/08/2017  . Macular degeneration 04/08/2017  . Chronic back pain 04/08/2017  . MDD (major depressive disorder), recurrent episode, moderate (HCC) 04/08/2017  . PTSD (post-traumatic stress disorder) 04/08/2017  . Hyperoxaluria 03/29/2017  . Ureteral stone 11/06/2016    Past Surgical History:  Procedure Laterality Date  . CESAREAN SECTION    . CYSTOSCOPY WITH RETROGRADE PYELOGRAM, URETEROSCOPY AND STENT PLACEMENT Right 12/05/2016   Procedure: CYSTOSCOPY WITH RETROGRADE PYELOGRAM, URETEROSCOPY AND STENT PLACEMENT;  Surgeon: Sebastian Ache, MD;  Location: [facility] ORS;  Service: Urology;  Laterality: Right;  . CYSTOSCOPY WITH STENT PLACEMENT Right 11/06/2016   Procedure: CYSTOSCOPY WITH STENT PLACEMENT;  Surgeon: Bjorn Pippin, MD;  Location: [facility] ORS;  Service: Urology;  Laterality: Right;  . FLEXOR TENDON REPAIR Right    foot  . KIDNEY STONE SURGERY       OB History   None      Home Medications    Prior to Admission medications   Medication Sig Start Date End Date Taking? Authorizing Provider  albuterol (PROVENTIL HFA;VENTOLIN HFA) 108 (90 Base) MCG/ACT inhaler Inhale 2 puffs into the lungs every 4 (four) hours as needed for  wheezing or shortness of breath.    Yes [provider]  diphenhydrAMINE (BENADRYL) 25 MG tablet Take 25 mg by mouth every 6 (six) hours as needed for allergies.    Yes [provider]  albuterol (PROVENTIL) (2.5 MG/3ML) 0.083% nebulizer solution Take 2.5 mg by nebulization every 6 (six) hours as needed for wheezing or shortness of breath.    [provider]  buPROPion (WELLBUTRIN XL) 150 MG 24 hr tablet Take 1 tablet (150 mg total) by mouth daily. 07/21/17   Neysa Hotter, MD  cetirizine (ZYRTEC) 10 MG tablet Take 10 mg by mouth daily.    [provider]    diclofenac (VOLTAREN) 75 MG EC tablet Take 75 mg by mouth 2 (two) times daily.    [provider]  DULoxetine (CYMBALTA) 30 MG capsule Take 3 capsules (90 mg total) by mouth daily. 07/21/17   Neysa Hotter, MD  fluticasone (FLOVENT DISKUS) 50 MCG/BLIST diskus inhaler Inhale 1 puff into the lungs 2 (two) times daily.    [provider]  gabapentin (NEURONTIN) 300 MG capsule Take 1 capsule (300 mg total) by mouth 2 (two) times daily. 07/21/17   Neysa Hotter, MD  lisinopril (PRINIVIL,ZESTRIL) 20 MG tablet Take 20 mg by mouth daily.    [provider]  metFORMIN (GLUCOPHAGE) 500 MG tablet Take 500 mg by mouth 2 (two) times daily with a meal.     [provider]  montelukast (SINGULAIR) 10 MG tablet Take 10 mg by mouth daily.    [provider]  omeprazole (PRILOSEC) 40 MG capsule Take 40 mg by mouth daily.    [provider]  oxybutynin (DITROPAN) 5 MG tablet Take 1 tablet (5 mg total) by mouth every 8 (eight) hours as needed for bladder spasms. And stent discomfort. 11/25/16   Sebastian Ache, MD  phenytoin (DILANTIN) 100 MG ER capsule Take 300 mg by mouth daily.    [provider]  potassium citrate (UROCIT-K) 10 MEQ (1080 MG) SR tablet Take 1 tablet (10 mEq total) by mouth 2 (two) times daily with a meal. 03/31/17   Sebastian Ache, MD  simvastatin (ZOCOR) 40 MG tablet Take 40 mg by mouth daily.    [provider]  Tiotropium Bromide-Olodaterol (STIOLTO RESPIMAT) 2.5-2.5 MCG/ACT AERS Inhale into the lungs.    [provider]  Vitamin D, Ergocalciferol, (DRISDOL) 50000 units CAPS capsule Take 50,000 Units by mouth every 7 (seven) days.    [provider]    Family History Family History  Problem Relation Age of Onset  . Prostate cancer Father   . Diabetes Father     Social History Social History   Tobacco Use  . Smoking status: Former Smoker    Types: Cigarettes    Last attempt to quit: 11/12/2016     Years since quitting: 1.3  . Smokeless tobacco: Former Neurosurgeon  . Tobacco comment: 04-08-2017 per pt stopped in Jan 2018  Substance Use Topics  . Alcohol use: Yes    Comment: occassional, 04-08-2017 per pt occas.  . Drug use: No    Comment: 04-08-2017 per pt no      Allergies   Fruit extracts   Review of Systems Review of Systems  Constitutional: Negative for activity change, appetite change and fever.  HENT: Negative for congestion and rhinorrhea.   Eyes: Negative for visual disturbance.  Respiratory: Positive for cough, chest tightness and shortness of breath.   Cardiovascular: Negative for chest pain.  Gastrointestinal: Negative for abdominal  pain, nausea and vomiting.  Genitourinary: Negative for dysuria, pelvic pain, vaginal bleeding and vaginal discharge.  Musculoskeletal: Negative for arthralgias and neck pain.  Skin: Negative for rash.  Neurological: Positive for seizures. Negative for headaches.   all other systems are negative except as noted in the HPI and PMH.     Physical Exam Updated Vital Signs BP (!) 152/90   Pulse (!) 113   Temp 98 F (36.7 C) (Oral)   Resp 13   Ht 5' (1.524 m)   Wt 108.9 kg (240 lb)   LMP 12/06/2011 (Approximate)   SpO2 97%   BMI 46.87 kg/m   Physical Exam  Constitutional: She is oriented to person, place, and time. She appears well-developed and well-nourished. No distress.  Slight dyspnea with conversation  HENT:  Head: Normocephalic and atraumatic.  Mouth/Throat: Oropharynx is clear and moist. No oropharyngeal exudate.  Eyes: Pupils are equal, round, and reactive to light. Conjunctivae and EOM are normal.  Neck: Normal range of motion. Neck supple.  No meningismus.  Cardiovascular: Normal rate, normal heart sounds and intact distal pulses.  No murmur heard. Tachycardic to the 120s  Pulmonary/Chest: Effort normal and breath sounds normal. No respiratory distress.  Diminished breath sounds with faint expiratory wheezing    Abdominal: Soft. There is no tenderness. There is no rebound and no guarding.  Musculoskeletal: Normal range of motion. She exhibits no edema or tenderness.  Neurological: She is alert and oriented to person, place, and time. No cranial nerve deficit. She exhibits normal muscle tone. Coordination normal.   5/5 strength throughout. CN 2-12 intact.Equal grip strength.   Skin: Skin is warm. Capillary refill takes less than 2 seconds.  Psychiatric: She has a normal mood and affect. Her behavior is normal.  Nursing note and vitals reviewed.    ED Treatments / Results  Labs (all labs ordered are listed, but only abnormal results are displayed) Labs Reviewed  TROPONIN I - Abnormal; Notable for the following components:      Result Value   Troponin I 0.19 (*)    All other components within normal limits  CBC WITH DIFFERENTIAL/PLATELET - Abnormal; Notable for the following components:   WBC 18.8 (*)    Neutro Abs 14.8 (*)    Eosinophils Absolute 0.8 (*)    All other components within normal limits  COMPREHENSIVE METABOLIC PANEL - Abnormal; Notable for the following components:   Glucose, Bld 189 (*)    All other components within normal limits  ACETAMINOPHEN LEVEL - Abnormal; Notable for the following components:   Acetaminophen (Tylenol), Serum <10 (*)    All other components within normal limits  PHENYTOIN LEVEL, TOTAL - Abnormal; Notable for the following components:   Phenytoin Lvl <2.5 (*)    All other components within normal limits  CBG MONITORING, ED - Abnormal; Notable for the following components:   Glucose-Capillary 217 (*)    All other components within normal limits  ETHANOL  SALICYLATE LEVEL  URINALYSIS, ROUTINE W REFLEX MICROSCOPIC  D-DIMER, QUANTITATIVE (NOT AT [facility])  TROPONIN I    EKG EKG Interpretation  Date/Time:  Tuesday Mar 16 2018 05:42:29 EDT Ventricular Rate:  93 PR Interval:    QRS Duration: 93 QT Interval:  390 QTC Calculation: 486 R  Axis:   -41 Text Interpretation:  Incomplete analysis due to missing data in precordial lead(s) Sinus rhythm Left axis deviation Nonspecific T abnormalities, lateral leads Borderline prolonged QT interval Missing lead(s): V2  V2 missing  no STEMI  Confirmed by Glynn Octave [phone]) on 03/16/2018 5:44:46 AM   Radiology Dg Chest 2 View  Result Date: 03/15/2018 CLINICAL DATA:  57 y/o F; seizure and shortness of breath today. History of asthma, COPD, diabetes, hypertension, and former smoker. EXAM: CHEST - 2 VIEW COMPARISON:  None. FINDINGS: Normal cardiac silhouette. Aortic atherosclerosis with calcification. Clear lungs. No pleural effusion or pneumothorax. No acute osseous abnormality is evident. IMPRESSION: No acute pulmonary process identified.  Aortic atherosclerosis. Electronically Signed   By: Mitzi Hansen M.D.   On: 03/15/2018 22:18   Ct Head Wo Contrast  Result Date: 03/15/2018 CLINICAL DATA:  56 year old female with altered mental status. EXAM: CT HEAD WITHOUT CONTRAST TECHNIQUE: Contiguous axial images were obtained from the base of the skull through the vertex without intravenous contrast. COMPARISON:  None. FINDINGS: Brain: No evidence of acute infarction, hemorrhage, hydrocephalus, extra-axial collection or mass lesion/mass effect. Vascular: No hyperdense vessel or unexpected calcification. Skull: Normal. Negative for fracture or focal lesion. Sinuses/Orbits: Mild diffuse mucoperiosteal thickening of paranasal sinuses. No air-fluid levels. The mastoid air cells are clear. Other: None IMPRESSION: No acute intracranial pathology. Electronically Signed   By: Elgie Collard M.D.   On: 03/15/2018 22:29    Procedures Procedures (including critical care time)  Medications Ordered in ED Medications  methylPREDNISolone sodium succinate (SOLU-MEDROL) 125 mg/2 mL injection 125 mg (has no administration in time range)  aspirin chewable tablet 324 mg (has no administration in time  range)  sodium chloride 0.9 % bolus 1,000 mL (1,000 mLs Intravenous New Bag/Given 03/15/18 2209)     Initial Impression / Assessment and Plan / ED Course  I have reviewed the triage vital signs and the nursing notes.  Pertinent labs & imaging results that were available during my care of the patient were reviewed by me and considered in my medical decision making (see chart for details).     Witnessed seizure with seizure disorder and questionable compliance with medications.  Patient denies any such illicit substance abuse or alcohol abuse. Ongoing shortness of breath for the past day with history of COPD.   EKG is nonischemic with sinus tachycardia.  Patient's lungs with minimal wheezing.  Given her elevated troponin which could be secondary to seizure we will rule out PE as source of her tachycardia and dyspnea.  CT head is negative.  Phenytoin levels undetectable.  Patient given phenytoin loading dose.  She remains tachycardic and short of breath but not hypoxic.  D-dimer was obtained given her persistent symptoms.  This was negative.  Low suspicion for ACS or pulmonary embolism.  Patient does have elevated troponins which are likely due to her seizure activity.  She denies any chest pain and EKG shows no acute ischemic change.  Troponin is rising to 0.8 on second check we will initiate IV heparin.  Patient continues to deny chest pain.   Plan admission for observation given elevated troponins and persistent tachycardia.  Discussed with Dr. Sharl Ma.   CRITICAL CARE Performed by: Glynn Octave Total critical care time: 45 minutes Critical care time was exclusive of separately billable procedures and treating other patients. Critical care was necessary to treat or prevent imminent or life-threatening deterioration. Critical care was time spent personally by me on the following activities: development of treatment plan with patient and/or surrogate as well as nursing, discussions with  consultants, evaluation of patient's response to treatment, examination of patient, obtaining history from patient or surrogate, ordering and performing treatments and interventions, ordering and review of laboratory studies,  ordering and review of radiographic studies, pulse oximetry and re-evaluation of patient's condition.  ED ECG REPORT   Date: 03/16/2018  Rate: 117  Rhythm: sinus tachycardia  QRS Axis: normal  Intervals: normal  ST/T Wave abnormalities: normal  Conduction Disutrbances:none  Narrative Interpretation:   Old EKG Reviewed: unchanged  I have personally reviewed the EKG tracing and agree with the computerized printout as noted.   Final Clinical Impressions(s) / ED Diagnoses   Final diagnoses:  Seizure (HCC)  Elevated troponin    ED Discharge Orders    None       Stephanee Barcomb, Jeannett Senior, MD 03/16/18 [phone]

## 2018-03-15 NOTE — ED Triage Notes (Signed)
Pt came in by EMS had 10 minute tonic/clonic seizure. Pt on 2 L oxygen, pt has had benadryl and used her inhaler today several times today due to allergies. Pt also complains of shortness of breath. Heart rate was in 130s. Pt blood sugar was 202

## 2018-03-16 ENCOUNTER — Encounter (HOSPITAL_COMMUNITY): Payer: Self-pay

## 2018-03-16 ENCOUNTER — Inpatient Hospital Stay (HOSPITAL_COMMUNITY): Payer: Medicaid Other

## 2018-03-16 ENCOUNTER — Emergency Department (HOSPITAL_COMMUNITY): Payer: Medicaid Other

## 2018-03-16 DIAGNOSIS — R0789 Other chest pain: Secondary | ICD-10-CM | POA: Diagnosis not present

## 2018-03-16 DIAGNOSIS — F988 Other specified behavioral and emotional disorders with onset usually occurring in childhood and adolescence: Secondary | ICD-10-CM | POA: Diagnosis present

## 2018-03-16 DIAGNOSIS — J449 Chronic obstructive pulmonary disease, unspecified: Secondary | ICD-10-CM | POA: Diagnosis present

## 2018-03-16 DIAGNOSIS — F329 Major depressive disorder, single episode, unspecified: Secondary | ICD-10-CM | POA: Diagnosis present

## 2018-03-16 DIAGNOSIS — Z7984 Long term (current) use of oral hypoglycemic drugs: Secondary | ICD-10-CM | POA: Diagnosis not present

## 2018-03-16 DIAGNOSIS — Z6841 Body Mass Index (BMI) 40.0 and over, adult: Secondary | ICD-10-CM | POA: Diagnosis not present

## 2018-03-16 DIAGNOSIS — K219 Gastro-esophageal reflux disease without esophagitis: Secondary | ICD-10-CM | POA: Diagnosis present

## 2018-03-16 DIAGNOSIS — E119 Type 2 diabetes mellitus without complications: Secondary | ICD-10-CM | POA: Diagnosis present

## 2018-03-16 DIAGNOSIS — I429 Cardiomyopathy, unspecified: Secondary | ICD-10-CM | POA: Diagnosis present

## 2018-03-16 DIAGNOSIS — R0602 Shortness of breath: Secondary | ICD-10-CM | POA: Diagnosis not present

## 2018-03-16 DIAGNOSIS — G4730 Sleep apnea, unspecified: Secondary | ICD-10-CM | POA: Diagnosis present

## 2018-03-16 DIAGNOSIS — F419 Anxiety disorder, unspecified: Secondary | ICD-10-CM | POA: Diagnosis present

## 2018-03-16 DIAGNOSIS — I248 Other forms of acute ischemic heart disease: Secondary | ICD-10-CM | POA: Diagnosis present

## 2018-03-16 DIAGNOSIS — R7989 Other specified abnormal findings of blood chemistry: Secondary | ICD-10-CM | POA: Diagnosis not present

## 2018-03-16 DIAGNOSIS — M549 Dorsalgia, unspecified: Secondary | ICD-10-CM | POA: Diagnosis present

## 2018-03-16 DIAGNOSIS — I214 Non-ST elevation (NSTEMI) myocardial infarction: Secondary | ICD-10-CM | POA: Diagnosis not present

## 2018-03-16 DIAGNOSIS — R569 Unspecified convulsions: Secondary | ICD-10-CM

## 2018-03-16 DIAGNOSIS — I110 Hypertensive heart disease with heart failure: Secondary | ICD-10-CM | POA: Diagnosis present

## 2018-03-16 DIAGNOSIS — R000 Tachycardia, unspecified: Secondary | ICD-10-CM | POA: Diagnosis present

## 2018-03-16 DIAGNOSIS — G8929 Other chronic pain: Secondary | ICD-10-CM | POA: Diagnosis present

## 2018-03-16 DIAGNOSIS — I509 Heart failure, unspecified: Secondary | ICD-10-CM | POA: Diagnosis present

## 2018-03-16 DIAGNOSIS — Z833 Family history of diabetes mellitus: Secondary | ICD-10-CM | POA: Diagnosis not present

## 2018-03-16 DIAGNOSIS — I5181 Takotsubo syndrome: Secondary | ICD-10-CM | POA: Diagnosis present

## 2018-03-16 DIAGNOSIS — E785 Hyperlipidemia, unspecified: Secondary | ICD-10-CM | POA: Diagnosis present

## 2018-03-16 DIAGNOSIS — R748 Abnormal levels of other serum enzymes: Secondary | ICD-10-CM | POA: Diagnosis not present

## 2018-03-16 DIAGNOSIS — R778 Other specified abnormalities of plasma proteins: Secondary | ICD-10-CM | POA: Diagnosis present

## 2018-03-16 DIAGNOSIS — E7800 Pure hypercholesterolemia, unspecified: Secondary | ICD-10-CM | POA: Diagnosis not present

## 2018-03-16 DIAGNOSIS — G40909 Epilepsy, unspecified, not intractable, without status epilepticus: Secondary | ICD-10-CM | POA: Diagnosis present

## 2018-03-16 DIAGNOSIS — H3530 Unspecified macular degeneration: Secondary | ICD-10-CM | POA: Diagnosis not present

## 2018-03-16 DIAGNOSIS — I2510 Atherosclerotic heart disease of native coronary artery without angina pectoris: Secondary | ICD-10-CM | POA: Diagnosis not present

## 2018-03-16 DIAGNOSIS — F4310 Post-traumatic stress disorder, unspecified: Secondary | ICD-10-CM | POA: Diagnosis present

## 2018-03-16 DIAGNOSIS — F909 Attention-deficit hyperactivity disorder, unspecified type: Secondary | ICD-10-CM | POA: Diagnosis present

## 2018-03-16 LAB — URINALYSIS, ROUTINE W REFLEX MICROSCOPIC
BILIRUBIN URINE: NEGATIVE
Glucose, UA: NEGATIVE mg/dL
HGB URINE DIPSTICK: NEGATIVE
KETONES UR: NEGATIVE mg/dL
Leukocytes, UA: NEGATIVE
Nitrite: NEGATIVE
PROTEIN: NEGATIVE mg/dL
SPECIFIC GRAVITY, URINE: 1.016 (ref 1.005–1.030)
pH: 5 (ref 5.0–8.0)

## 2018-03-16 LAB — HEPARIN LEVEL (UNFRACTIONATED)
Heparin Unfractionated: 0.13 IU/mL — ABNORMAL LOW (ref 0.30–0.70)
Heparin Unfractionated: 0.26 IU/mL — ABNORMAL LOW (ref 0.30–0.70)

## 2018-03-16 LAB — GLUCOSE, CAPILLARY
GLUCOSE-CAPILLARY: 206 mg/dL — AB (ref 65–99)
Glucose-Capillary: 217 mg/dL — ABNORMAL HIGH (ref 65–99)
Glucose-Capillary: 127 mg/dL — ABNORMAL HIGH (ref 65–99)

## 2018-03-16 LAB — HEMOGLOBIN A1C
Hgb A1c MFr Bld: 6.6 % — ABNORMAL HIGH (ref 4.8–5.6)
Mean Plasma Glucose: 142.72 mg/dL

## 2018-03-16 LAB — ECHOCARDIOGRAM COMPLETE
HEIGHTINCHES: 64 in
WEIGHTICAEL: 3806.02 [oz_av]

## 2018-03-16 LAB — CK: Total CK: 270 U/L — ABNORMAL HIGH (ref 38–234)

## 2018-03-16 LAB — TROPONIN I
TROPONIN I: 0.82 ng/mL — AB (ref <0.03)
TROPONIN I: 1.64 ng/mL — AB (ref <0.03)
Troponin I: 1.36 ng/mL (ref <0.03)
Troponin I: 1.47 ng/mL (ref <0.03)

## 2018-03-16 LAB — APTT: aPTT: 86 seconds — ABNORMAL HIGH (ref 24–36)

## 2018-03-16 LAB — PROTIME-INR
INR: 1.02
Prothrombin Time: 13.3 seconds (ref 11.4–15.2)

## 2018-03-16 MED ORDER — GABAPENTIN 300 MG PO CAPS
300.0000 mg | ORAL_CAPSULE | Freq: Two times a day (BID) | ORAL | Status: DC
  Administered 2018-03-16 – 2018-03-18 (×5): 300 mg via ORAL
  Filled 2018-03-16 (×5): qty 1

## 2018-03-16 MED ORDER — ASPIRIN EC 81 MG PO TBEC
81.0000 mg | DELAYED_RELEASE_TABLET | Freq: Every day | ORAL | Status: DC
  Administered 2018-03-16 – 2018-03-18 (×3): 81 mg via ORAL
  Filled 2018-03-16 (×3): qty 1

## 2018-03-16 MED ORDER — ALBUTEROL SULFATE (2.5 MG/3ML) 0.083% IN NEBU
3.0000 mL | INHALATION_SOLUTION | RESPIRATORY_TRACT | Status: DC | PRN
  Administered 2018-03-16 – 2018-03-18 (×4): 3 mL via RESPIRATORY_TRACT
  Filled 2018-03-16 (×4): qty 3

## 2018-03-16 MED ORDER — PHENYTOIN 50 MG PO CHEW
300.0000 mg | CHEWABLE_TABLET | ORAL | Status: AC
  Administered 2018-03-16 (×2): 300 mg via ORAL
  Filled 2018-03-16 (×3): qty 6

## 2018-03-16 MED ORDER — SODIUM CHLORIDE 0.9 % IV SOLN
INTRAVENOUS | Status: DC
  Administered 2018-03-16: 08:00:00 via INTRAVENOUS

## 2018-03-16 MED ORDER — PHENYTOIN SODIUM EXTENDED 100 MG PO CAPS: 300.0000 mg | ORAL_CAPSULE | Freq: Every day | ORAL | Status: DC

## 2018-03-16 MED ORDER — SIMVASTATIN 20 MG PO TABS
40.0000 mg | ORAL_TABLET | Freq: Every day | ORAL | Status: DC
  Administered 2018-03-16 – 2018-03-17 (×2): 40 mg via ORAL
  Filled 2018-03-16 (×2): qty 2

## 2018-03-16 MED ORDER — PHENYTOIN SODIUM EXTENDED 100 MG PO CAPS
200.0000 mg | ORAL_CAPSULE | Freq: Two times a day (BID) | ORAL | Status: DC
  Administered 2018-03-16 – 2018-03-18 (×4): 200 mg via ORAL
  Filled 2018-03-16 (×4): qty 2

## 2018-03-16 MED ORDER — FLUTICASONE PROPIONATE (INHAL) 50 MCG/BLIST IN AEPB: 1.0000 | INHALATION_SPRAY | Freq: Two times a day (BID) | RESPIRATORY_TRACT | Status: DC

## 2018-03-16 MED ORDER — BUDESONIDE 0.25 MG/2ML IN SUSP
0.2500 mg | Freq: Two times a day (BID) | RESPIRATORY_TRACT | Status: DC
  Administered 2018-03-16 – 2018-03-18 (×5): 0.25 mg via RESPIRATORY_TRACT
  Filled 2018-03-16 (×6): qty 2

## 2018-03-16 MED ORDER — HEPARIN (PORCINE) IN NACL 100-0.45 UNIT/ML-% IJ SOLN
1800.0000 [IU]/h | INTRAMUSCULAR | Status: DC
Start: 2018-03-16 — End: 2018-03-17
  Administered 2018-03-16: 900 [IU]/h via INTRAVENOUS
  Administered 2018-03-16: 1500 [IU]/h via INTRAVENOUS
  Filled 2018-03-16 (×2): qty 250

## 2018-03-16 MED ORDER — INSULIN ASPART 100 UNIT/ML ~~LOC~~ SOLN: 0.0000 [IU] | Freq: Three times a day (TID) | SUBCUTANEOUS | Status: DC

## 2018-03-16 MED ORDER — LISINOPRIL 10 MG PO TABS
20.0000 mg | ORAL_TABLET | Freq: Every day | ORAL | Status: DC
  Administered 2018-03-16: 20 mg via ORAL
  Filled 2018-03-16: qty 2

## 2018-03-16 MED ORDER — LORATADINE 10 MG PO TABS
10.0000 mg | ORAL_TABLET | Freq: Every day | ORAL | Status: DC
  Administered 2018-03-16 – 2018-03-18 (×3): 10 mg via ORAL
  Filled 2018-03-16 (×3): qty 1

## 2018-03-16 MED ORDER — ACETAMINOPHEN 650 MG RE SUPP: 650.0000 mg | Freq: Four times a day (QID) | RECTAL | Status: DC | PRN

## 2018-03-16 MED ORDER — ONDANSETRON HCL 4 MG/2ML IJ SOLN: 4.0000 mg | Freq: Four times a day (QID) | INTRAMUSCULAR | Status: DC | PRN

## 2018-03-16 MED ORDER — MONTELUKAST SODIUM 10 MG PO TABS
10.0000 mg | ORAL_TABLET | Freq: Every day | ORAL | Status: DC
  Administered 2018-03-16 – 2018-03-18 (×3): 10 mg via ORAL
  Filled 2018-03-16 (×3): qty 1

## 2018-03-16 MED ORDER — INSULIN ASPART 100 UNIT/ML ~~LOC~~ SOLN
0.0000 [IU] | Freq: Three times a day (TID) | SUBCUTANEOUS | Status: DC
  Administered 2018-03-16: 1 [IU] via SUBCUTANEOUS
  Administered 2018-03-16 (×2): 3 [IU] via SUBCUTANEOUS
  Administered 2018-03-17 – 2018-03-18 (×4): 1 [IU] via SUBCUTANEOUS

## 2018-03-16 MED ORDER — PHENYTOIN 50 MG PO CHEW
400.0000 mg | CHEWABLE_TABLET | Freq: Once | ORAL | Status: AC
  Administered 2018-03-16: 400 mg via ORAL
  Filled 2018-03-16 (×2): qty 8

## 2018-03-16 MED ORDER — PANTOPRAZOLE SODIUM 40 MG PO TBEC
40.0000 mg | DELAYED_RELEASE_TABLET | Freq: Every day | ORAL | Status: DC
Start: 2018-03-16 — End: 2018-03-18
  Administered 2018-03-16 – 2018-03-18 (×3): 40 mg via ORAL
  Filled 2018-03-16 (×3): qty 1

## 2018-03-16 MED ORDER — METOPROLOL SUCCINATE ER 25 MG PO TB24
12.5000 mg | ORAL_TABLET | Freq: Every day | ORAL | Status: DC
  Administered 2018-03-16 – 2018-03-18 (×3): 12.5 mg via ORAL
  Filled 2018-03-16 (×3): qty 1

## 2018-03-16 MED ORDER — PHENYTOIN 50 MG PO CHEW
CHEWABLE_TABLET | ORAL | Status: AC
  Filled 2018-03-16: qty 5

## 2018-03-16 MED ORDER — HEPARIN BOLUS VIA INFUSION
4000.0000 [IU] | Freq: Once | INTRAVENOUS | Status: AC
  Administered 2018-03-16: 4000 [IU] via INTRAVENOUS

## 2018-03-16 MED ORDER — HEPARIN BOLUS VIA INFUSION
2000.0000 [IU] | Freq: Once | INTRAVENOUS | Status: AC
  Administered 2018-03-16: 2000 [IU] via INTRAVENOUS
  Filled 2018-03-16: qty 2000

## 2018-03-16 MED ORDER — DULOXETINE HCL 30 MG PO CPEP
90.0000 mg | ORAL_CAPSULE | Freq: Every day | ORAL | Status: DC
  Administered 2018-03-16 – 2018-03-18 (×3): 90 mg via ORAL
  Filled 2018-03-16 (×2): qty 3
  Filled 2018-03-16: qty 1

## 2018-03-16 MED ORDER — PHENYTOIN 50 MG PO CHEW
400.0000 mg | CHEWABLE_TABLET | ORAL | Status: DC
  Filled 2018-03-16 (×3): qty 8

## 2018-03-16 MED ORDER — ONDANSETRON HCL 4 MG PO TABS: 4.0000 mg | ORAL_TABLET | Freq: Four times a day (QID) | ORAL | Status: DC | PRN

## 2018-03-16 MED ORDER — ACETAMINOPHEN 325 MG PO TABS: 650.0000 mg | ORAL_TABLET | Freq: Four times a day (QID) | ORAL | Status: DC | PRN

## 2018-03-16 NOTE — Progress Notes (Signed)
CRITICAL VALUE ALERT  Critical Value: Troponin 1.64  Date & Time Notied:  03/16/18  Provider Notified: Dr. Wyline Mood  Orders Received/Actions taken: No new orders

## 2018-03-16 NOTE — Progress Notes (Signed)
The patient Savannah Pena has current phenytoin level of  Phenytoin Lvl  Date Value Ref Range Status  03/15/2018 <2.5 (L) 10.0 - 20.0 ug/mL Final    Comment:    Performed at [facility], [address] [place], [place] [address]    Other pertinent lab values include: No components found for: LABALBU Creatinine, Ser  Date Value Ref Range Status  03/15/2018 0.69 0.44 - 1.00 mg/dL Final   The patient will be loaded with 1000 mg of phenytoin by Oral route to result in a level of 15-20 mcg/mL.  Will continue to monitor phenytoin levels, albumin and creatinine as needed for dosing adjustments.  Doses are based on the following calculations:  Phenytoin level undetectable. Will be loading in 3 divided doses 2 hours apart.  400mg  followed by 300mg  x 2.  Loading dose = 0.7 (20 - <2.5) 64.5 kg  PLAN: After loading dose pt will be started on Dilantin ER capsules:  300mg  po daily at bedtime F/U phenytoin level in 4-5 days Monitor for s/sx adverse effects  Valrie Hart, PharmD Clinical Pharmacist Pager:  [phone] 03/16/2018  Wayland Denis

## 2018-03-16 NOTE — Progress Notes (Signed)
*  PRELIMINARY RESULTS* Echocardiogram 2D Echocardiogram has been performed.  Savannah Pena 03/16/2018, 2:14 PM

## 2018-03-16 NOTE — Consult Note (Signed)
[place] A. Merlene Laughter, MD     [email or web address]          Savannah Pena is an 57 y.o. female.   ASSESSMENT/PLAN: 1. Baseline epilepsy with an episode of seizure due to subtherapeutic levels: The patient will be placed back on baseline Dilantin dosing at 200 mg twice a day. For now we will restrain the patient from operating machinery or driving. She should follow-up in the office in 4 weeks for further evaluation.     This is a 57 year old right-handed white female who presents with a long-standing history of epilepsy. She presents with another episode of seizure characterized by generalized tonic-clonic events. She did bite the inside of her tongue on the left side. She tells that she had her first seizure at 57 years old. Before that she was having episodes of frequent eye blinking. There is no family history of seizures. There may be some evidence of head injury in the past. No history of prematurity, CNS infection at birth or developmental delay. She reports that she was previously on phenobarbital and the Dilantin. She was on a high dose of phenobarbital. She apparently weaned herself off the phenobarbital. She has gone seizure-free for many years. In fact, she reports that her last seizure was 25 years ago. She typically takes 400 mg day but over the last month or 2 has cut down to 300 mg or less because she ran out of her medications and could not get an appointment.She currently operates a motor vehicle and is otherwise functional. The review of systems otherwise negative.  GENERAL: This is a pleasant obese female who is in no acute distress.  HEENT: Supple. Tongue bite on the left side.  ABDOMEN: soft  EXTREMITIES: No edema   BACK: Normal.  SKIN: Normal by inspection.    MENTAL STATUS: Alert and oriented. Speech, language and cognition are generally intact. Judgment and insight normal.   CRANIAL NERVES: Pupils are equal, round and reactive to light and  accommodation; extra ocular movements are full, there is no significant nystagmus; visual fields are full; upper and lower facial muscles are normal in strength and symmetric, there is no flattening of the nasolabial folds; tongue is midline; uvula is midline; shoulder elevation is normal.  MOTOR: Normal tone, bulk and strength; no pronator drift.  COORDINATION: Left finger to nose is normal, right finger to nose is normal, No rest tremor; no intention tremor; no postural tremor; no bradykinesia.  REFLEXES: Deep tendon reflexes are symmetrical and normal. Babinski reflexes are flexor bilaterally.   SENSATION: Normal to light touch.  GAIT: Normal    Blood pressure 110/80, pulse 87, temperature 98.6 F (37 C), temperature source Oral, resp. rate 16, height '5\' 4"'$  (1.626 m), weight 237 lb 14 oz (107.9 kg), last menstrual period 12/06/2011, SpO2 95 %.  Past Medical History:  Diagnosis Date  . Anxiety   . Arthritis   . Asthma   . Attention deficit disorder (ADD)   . COPD (chronic obstructive pulmonary disease) ([place])   . Diabetes mellitus without complication ([place])   . Dyspnea    with exertion  . GERD (gastroesophageal reflux disease)   . History of kidney stones   . Hypertension   . Major depression   . Seizures ([place])    last seizure 23 years ago  . Sleep apnea    has C-PAP, but does not currently use    Past Surgical History:  Procedure Laterality Date  . CESAREAN SECTION    .  CYSTOSCOPY WITH RETROGRADE PYELOGRAM, URETEROSCOPY AND STENT PLACEMENT Right 12/05/2016   Procedure: CYSTOSCOPY WITH RETROGRADE PYELOGRAM, URETEROSCOPY AND STENT PLACEMENT;  Surgeon: Alexis Frock, MD;  Location: [facility] ORS;  Service: Urology;  Laterality: Right;  . CYSTOSCOPY WITH STENT PLACEMENT Right 11/06/2016   Procedure: CYSTOSCOPY WITH STENT PLACEMENT;  Surgeon: Irine Seal, MD;  Location: [facility] ORS;  Service: Urology;  Laterality: Right;  . FLEXOR TENDON REPAIR Right    foot  . KIDNEY STONE SURGERY       Family History  Problem Relation Age of Onset  . Prostate cancer Father   . Diabetes Father     Social History:  reports that she quit smoking about 16 months ago. Her smoking use included cigarettes. She has quit using smokeless tobacco. She reports that she drinks alcohol. She reports that she does not use drugs.  Allergies:  Allergies  Allergen Reactions  . Fruit Extracts Nausea And Vomiting    Bananas    Medications: Prior to Admission medications   Medication Sig Start Date End Date Taking? Authorizing Provider  albuterol (PROVENTIL HFA;VENTOLIN HFA) 108 (90 Base) MCG/ACT inhaler Inhale 2 puffs into the lungs every 4 (four) hours as needed for wheezing or shortness of breath.    Yes [provider]  albuterol (PROVENTIL) (2.5 MG/3ML) 0.083% nebulizer solution Take 2.5 mg by nebulization every 6 (six) hours as needed for wheezing or shortness of breath.   Yes [provider]  diphenhydrAMINE (BENADRYL) 25 MG tablet Take 25 mg by mouth every 6 (six) hours as needed for allergies.    Yes [provider]  buPROPion (WELLBUTRIN XL) 150 MG 24 hr tablet Take 1 tablet (150 mg total) by mouth daily. Patient not taking: Reported on 03/15/2018 07/21/17   Norman Clay, MD  cetirizine (ZYRTEC) 10 MG tablet Take 10 mg by mouth daily.    [provider]  diclofenac (VOLTAREN) 75 MG EC tablet Take 75 mg by mouth 2 (two) times daily.    [provider]  DULoxetine (CYMBALTA) 30 MG capsule Take 3 capsules (90 mg total) by mouth daily. Patient not taking: Reported on 03/15/2018 07/21/17   Norman Clay, MD  fluticasone (FLOVENT DISKUS) 50 MCG/BLIST diskus inhaler Inhale 1 puff into the lungs 2 (two) times daily.    [provider]  gabapentin (NEURONTIN) 300 MG capsule Take 1 capsule (300 mg total) by mouth 2 (two) times daily. Patient not taking: Reported on 03/15/2018 07/21/17   Norman Clay, MD  lisinopril (PRINIVIL,ZESTRIL) 20 MG tablet Take  20 mg by mouth daily.    [provider]  metFORMIN (GLUCOPHAGE) 500 MG tablet Take 500 mg by mouth 2 (two) times daily with a meal.     [provider]  montelukast (SINGULAIR) 10 MG tablet Take 10 mg by mouth daily.    [provider]  omeprazole (PRILOSEC) 40 MG capsule Take 40 mg by mouth daily.    [provider]  oxybutynin (DITROPAN) 5 MG tablet Take 1 tablet (5 mg total) by mouth every 8 (eight) hours as needed for bladder spasms. And stent discomfort. Patient not taking: Reported on 03/15/2018 11/25/16   Alexis Frock, MD  phenytoin (DILANTIN) 100 MG ER capsule Take 300 mg by mouth daily.    [provider]  potassium citrate (UROCIT-K) 10 MEQ (1080 MG) SR tablet Take 1 tablet (10 mEq total) by mouth 2 (two) times daily with a meal. Patient not taking: Reported on 03/15/2018 03/31/17   Alexis Frock, MD  simvastatin (ZOCOR) 40 MG tablet Take 40 mg by mouth daily.    [provider]  Tiotropium Bromide-Olodaterol (STIOLTO RESPIMAT) 2.5-2.5 MCG/ACT AERS Inhale into the lungs.    [provider]  Vitamin D, Ergocalciferol, (DRISDOL) 50000 units CAPS capsule Take 50,000 Units by mouth every 7 (seven) days.    [provider]    Scheduled Meds: . aspirin EC  81 mg Oral Daily  . budesonide (PULMICORT) nebulizer solution  0.25 mg Nebulization BID  . DULoxetine  90 mg Oral Daily  . gabapentin  300 mg Oral BID  . insulin aspart  0-9 Units Subcutaneous TID WC  . lisinopril  20 mg Oral Daily  . loratadine  10 mg Oral Daily  . metoprolol succinate  12.5 mg Oral Daily  . montelukast  10 mg Oral Daily  . pantoprazole  40 mg Oral Daily  . phenytoin  200 mg Oral BID  . simvastatin  40 mg Oral Daily   Continuous Infusions: . sodium chloride 10 mL/hr at 03/16/18 0801  . heparin 1,500 Units/hr (03/16/18 1644)   PRN Meds:.acetaminophen **OR** acetaminophen, albuterol, ondansetron **OR** ondansetron (ZOFRAN) IV     Results  for orders placed or performed during the hospital encounter of 03/15/18 (from the past 48 hour(s))  CBG monitoring, ED     Status: Abnormal   Collection Time: 03/15/18  9:29 PM  Result Value Ref Range   Glucose-Capillary 217 (H) 65 - 99 mg/dL  Troponin I     Status: Abnormal   Collection Time: 03/15/18  9:40 PM  Result Value Ref Range   Troponin I 0.19 (HH) <0.03 ng/mL    Comment: CRITICAL RESULT CALLED TO, READ BACK BY AND VERIFIED WITH: CRABTREE,B ON 03/15/18 AT 2225 BY LOY,C Performed at [facility], [address], [place], [place] [address]   CBC with Differential     Status: Abnormal   Collection Time: 03/15/18  9:40 PM  Result Value Ref Range   WBC 18.8 (H) 4.0 - 10.5 K/uL   RBC 4.60 3.87 - 5.11 MIL/uL   Hemoglobin 14.5 12.0 - 15.0 g/dL   HCT 41.7 36.0 - 46.0 %   MCV 90.7 78.0 - 100.0 fL   MCH 31.5 26.0 - 34.0 pg   MCHC 34.8 30.0 - 36.0 g/dL   RDW 12.7 11.5 - 15.5 %   Platelets 308 150 - 400 K/uL   Neutrophils Relative % 78 %   Neutro Abs 14.8 (H) 1.7 - 7.7 K/uL   Lymphocytes Relative 12 %   Lymphs Abs 2.2 0.7 - 4.0 K/uL   Monocytes Relative 5 %   Monocytes Absolute 1.0 0.1 - 1.0 K/uL   Eosinophils Relative 5 %   Eosinophils Absolute 0.8 (H) 0.0 - 0.7 K/uL   Basophils Relative 0 %   Basophils Absolute 0.0 0.0 - 0.1 K/uL    Comment: Performed at [facility], [address] [place], [place] [address]  Comprehensive metabolic panel     Status: Abnormal   Collection Time: 03/15/18  9:40 PM  Result Value Ref Range   Sodium 140 135 - 145 mmol/L   Potassium 3.9 3.5 - 5.1 mmol/L   Chloride 104 101 - 111 mmol/L   CO2 25 22 - 32 mmol/L   Glucose, Bld 189 (H) 65 - 99 mg/dL   BUN 14 6 - 20 mg/dL   Creatinine, Ser 0.69 0.44 - 1.00 mg/dL   Calcium 9.5 8.9 - 10.3 mg/dL   Total Protein 7.5 6.5 -  8.1 g/dL   Albumin 4.1 3.5 - 5.0 g/dL   AST 19 15 - 41 U/L   ALT 30 14 - 54 U/L   Alkaline Phosphatase 91 38 - 126 U/L   Total Bilirubin 0.5 0.3 - 1.2 mg/dL   GFR calc non Af Amer  >60 >60 mL/min   GFR calc Af Amer >60 >60 mL/min    Comment: (NOTE) The eGFR has been calculated using the CKD EPI equation. This calculation has not been validated in all clinical situations. eGFR's persistently <60 mL/min signify possible Chronic Kidney Disease.    Anion gap 11 5 - 15    Comment: Performed at [facility], [address], [place], [place] [address]  Acetaminophen level     Status: Abnormal   Collection Time: 03/15/18  9:41 PM  Result Value Ref Range   Acetaminophen (Tylenol), Serum <10 (L) 10 - 30 ug/mL    Comment:        THERAPEUTIC CONCENTRATIONS VARY SIGNIFICANTLY. A RANGE OF 10-30 ug/mL MAY BE AN EFFECTIVE CONCENTRATION FOR MANY PATIENTS. HOWEVER, SOME ARE BEST TREATED AT CONCENTRATIONS OUTSIDE THIS RANGE. ACETAMINOPHEN CONCENTRATIONS >150 ug/mL AT 4 HOURS AFTER INGESTION AND >50 ug/mL AT 12 HOURS AFTER INGESTION ARE OFTEN ASSOCIATED WITH TOXIC REACTIONS. Performed at [facility], [address] [place], [place] [address]   Ethanol     Status: None   Collection Time: 03/15/18  9:41 PM  Result Value Ref Range   Alcohol, Ethyl (B) <10 <10 mg/dL    Comment:        LOWEST DETECTABLE LIMIT FOR SERUM ALCOHOL IS 10 mg/dL FOR MEDICAL PURPOSES ONLY Performed at [facility], [address] [place], [place] [address]   Salicylate level     Status: None   Collection Time: 03/15/18  9:41 PM  Result Value Ref Range   Salicylate Lvl <2.4 2.8 - 30.0 mg/dL    Comment: Performed at [facility], [address] [place], [place] [address]  Phenytoin level, total     Status: Abnormal   Collection Time: 03/15/18 10:50 PM  Result Value Ref Range   Phenytoin Lvl <2.5 (L) 10.0 - 20.0 ug/mL    Comment: Performed at [facility], [address], [place], [place] [address]  D-dimer, quantitative (not at [facility])     Status: None   Collection Time: 03/15/18 10:50 PM  Result Value Ref Range   D-Dimer, Quant 0.49 0.00 - 0.50 ug/mL-FEU    Comment: (NOTE) At the manufacturer  cut-off of 0.50 ug/mL FEU, this assay has been documented to exclude PE with a sensitivity and negative predictive value of 97 to 99%.  At this time, this assay has not been approved by the FDA to exclude DVT/VTE. Results should be correlated with clinical presentation. Performed at [facility], [address], [place], [place] [address]   Urinalysis, Routine w reflex microscopic     Status: Abnormal   Collection Time: 03/16/18 12:19 AM  Result Value Ref Range   Color, Urine YELLOW YELLOW   APPearance HAZY (A) CLEAR   Specific Gravity, Urine 1.016 1.005 - 1.030   pH 5.0 5.0 - 8.0   Glucose, UA NEGATIVE NEGATIVE mg/dL   Hgb urine dipstick NEGATIVE NEGATIVE   Bilirubin Urine NEGATIVE NEGATIVE   Ketones, ur NEGATIVE NEGATIVE mg/dL   Protein, ur NEGATIVE NEGATIVE mg/dL   Nitrite NEGATIVE NEGATIVE   Leukocytes, UA NEGATIVE NEGATIVE    Comment: Performed at [facility], [address], [place], [place] [address]  Troponin I     Status: Abnormal   Collection Time: 03/16/18 12:22 AM  Result Value Ref Range   Troponin I 0.82 (HH) <0.03 ng/mL    Comment: CRITICAL RESULT CALLED TO, READ BACK BY AND VERIFIED WITH: LONG,H. ON 03/16/2018 AT 0112 BY EVA Performed at [facility], [address] [place], [place] [address]   CK     Status: Abnormal   Collection Time: 03/16/18 12:22 AM  Result Value Ref Range   Total CK 270 (H) 38 - 234 U/L    Comment: Performed at [facility], [address], [place], [place] [address]  Hemoglobin A1c     Status: Abnormal   Collection Time: 03/16/18 12:33 AM  Result Value Ref Range   Hgb A1c MFr Bld 6.6 (H) 4.8 - 5.6 %    Comment: (NOTE) Pre diabetes:          5.7%-6.4% Diabetes:              >6.4% Glycemic control for   <7.0% adults with diabetes    Mean Plasma Glucose 142.72 mg/dL    Comment: Performed at [facility] [address] [place], [place] [address]  APTT     Status: Abnormal   Collection Time: 03/16/18  2:05 AM  Result Value Ref  Range   aPTT 86 (H) 24 - 36 seconds    Comment:        IF BASELINE aPTT IS ELEVATED, SUGGEST PATIENT RISK ASSESSMENT BE USED TO DETERMINE APPROPRIATE ANTICOAGULANT THERAPY. Performed at [facility], [address] [place], [place] [address]   Protime-INR     Status: None   Collection Time: 03/16/18  2:05 AM  Result Value Ref Range   Prothrombin Time 13.3 11.4 - 15.2 seconds   INR 1.02     Comment: Performed at [facility], [address] [place], [place] [address]  Troponin I (q 6hr x 3)     Status: Abnormal   Collection Time: 03/16/18  6:51 AM  Result Value Ref Range   Troponin I 1.36 (HH) <0.03 ng/mL    Comment: CRITICAL RESULT CALLED TO, READ BACK BY AND VERIFIED WITH: MOTLEY J. AT 0734A ON [id number] BY THOMPSON S. Performed at [facility], [address] [place], [place] [address]   Heparin level (unfractionated)     Status: Abnormal   Collection Time: 03/16/18  6:55 AM  Result Value Ref Range   Heparin Unfractionated <0.10 (L) 0.30 - 0.70 IU/mL    Comment:        IF HEPARIN RESULTS ARE BELOW EXPECTED VALUES, AND PATIENT DOSAGE HAS BEEN CONFIRMED, SUGGEST FOLLOW UP TESTING OF ANTITHROMBIN III LEVELS. Performed at [facility], [address] [place], [place] [address]   Glucose, capillary     Status: Abnormal   Collection Time: 03/16/18  7:14 AM  Result Value Ref Range   Glucose-Capillary 206 (H) 65 - 99 mg/dL  Glucose, capillary     Status: Abnormal   Collection Time: 03/16/18 11:03 AM  Result Value Ref Range   Glucose-Capillary 217 (H) 65 - 99 mg/dL  Troponin I (q 6hr x 3)     Status: Abnormal   Collection Time: 03/16/18 12:37 PM  Result Value Ref Range   Troponin I 1.64 (HH) <0.03 ng/mL    Comment: CRITICAL VALUE NOTED.  VALUE IS CONSISTENT WITH PREVIOUSLY REPORTED AND CALLED VALUE. Performed at [facility], [address], [place],  [address]   Heparin level (unfractionated)  Status: Abnormal   Collection Time: 03/16/18  2:30 PM  Result Value Ref  Range   Heparin Unfractionated 0.13 (L) 0.30 - 0.70 IU/mL    Comment:        IF HEPARIN RESULTS ARE BELOW EXPECTED VALUES, AND PATIENT DOSAGE HAS BEEN CONFIRMED, SUGGEST FOLLOW UP TESTING OF ANTITHROMBIN III LEVELS. Performed at [facility], [address] [place], [place] [address]   Glucose, capillary     Status: Abnormal   Collection Time: 03/16/18  4:21 PM  Result Value Ref Range   Glucose-Capillary 127 (H) 65 - 99 mg/dL    Studies/Results:     Milind Raether A. Merlene Laughter, M.D.  Diplomate, Tax adviser of Psychiatry and Neurology ( Neurology). 03/16/2018, 5:43 PM

## 2018-03-16 NOTE — Progress Notes (Signed)
Echo with decreased LVEF 35-40% with multiple primarily apical wall motion abnormalities. Pattern would be consistent with a stress induced CM/Takotsubo CM in the setting of her seizure, however CAD will need to be excluded, particularly given her multiple CAD risk factors. Plan for cath tomorrow, make npo at midnight. Start low dose Toprol XL 12.5mg  today   Dominga Ferry MD
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
## 2018-03-16 NOTE — Consult Note (Addendum)
Cardiology Consultation:   Patient ID: Savannah Pena; [id number]; 16-Jan-1961   Admit date: 03/15/2018 Date of Consult: 03/16/2018  Primary Care Provider: Evelene Croon, MD Primary Cardiologist: new- Dr Wyline Mood   Patient Profile:   Savannah Pena is a 57 y.o. female with a hx of DM, asthmatic COPD, obesity, and remote h/o seizures who is being seen today for the evaluation of an elevated Troponin at the request of Dr Laural Benes.  History of Present Illness:   Savannah Pena is an obese 57 y/o Caucasian female, mover to [place] from [place] in 2013. She lives [place] her f[place]. She had been followed by Dr Lacie Scotts in [place] but had car problems and has been working on transferring her care to [place]. She is on disability for asthma. She also has a remote history of seizures -"20 years ago". Because of access problems to health care she has been cutting back her medications. She presented to the ED at [facility] 03/15/18 after a witnessed seizure. Her Troponin rose to 1.36 and cardiology was consulted.   The pt says she has chest discomfort but has attributed this to her "astma and COPD".  She says she has had a prior cardiac evaluation in [place] in 2013. By her history she had an abnormal Nuclear stress followed by coronary angiogram which showed "25% blockage".   Past Medical History:  Diagnosis Date  . Anxiety   . Arthritis   . Asthma   . Attention deficit disorder (ADD)   . COPD (chronic obstructive pulmonary disease) (HCC)   . Diabetes mellitus without complication (HCC)   . Dyspnea    with exertion  . GERD (gastroesophageal reflux disease)   . History of kidney stones   . Hypertension   . Major depression   . Seizures (HCC)    last seizure 23 years ago  . Sleep apnea    has C-PAP, but does not currently use    Past Surgical History:  Procedure Laterality Date  . CESAREAN SECTION    . CYSTOSCOPY WITH RETROGRADE PYELOGRAM, URETEROSCOPY AND STENT PLACEMENT Right 12/05/2016   Procedure: CYSTOSCOPY WITH RETROGRADE PYELOGRAM, URETEROSCOPY AND STENT PLACEMENT;  Surgeon: Sebastian Ache, MD;  Location: [facility] ORS;  Service: Urology;  Laterality: Right;  . CYSTOSCOPY WITH STENT PLACEMENT Right 11/06/2016   Procedure: CYSTOSCOPY WITH STENT PLACEMENT;  Surgeon: Bjorn Pippin, MD;  Location: [facility] OR[facility];  Service: Urology;  Laterality: Right;  . FLEXOR TENDON REPAIR Right    foot  . KIDNEY STONE SURGERY       Home Medications:  Prior to Admission medications   Medication Sig Start Date End Date Taking? Authorizing Provider  albuterol (PROVENTIL HFA;VENTOLIN HFA) 108 (90 Base) MCG/ACT inhaler Inhale 2 puffs into the lungs every 4 (four) hours as needed for wheezing or shortness of breath.    Yes [provider]  albuterol (PROVENTIL) (2.5 MG/3ML) 0.083% nebulizer solution Take 2.5 mg by nebulization every 6 (six) hours as needed for wheezing or shortness of breath.   Yes [provider]  diphenhydrAMINE (BENADRYL) 25 MG tablet Take 25 mg by mouth every 6 (six) hours as needed for allergies.    Yes [provider]  buPROPion (WELLBUTRIN XL) 150 MG 24 hr tablet Take 1 tablet (150 mg total) by mouth daily. Patient not taking: Reported on 03/15/2018 07/21/17   Neysa Hotter, MD  cetirizine (ZYRTEC) 10 MG tablet Take 10 mg by mouth daily.    [provider]  diclofenac (VOLTAREN)  75 MG EC tablet Take 75 mg by mouth 2 (two) times daily.    [provider]  DULoxetine (CYMBALTA) 30 MG capsule Take 3 capsules (90 mg total) by mouth daily. Patient not taking: Reported on 03/15/2018 07/21/17   Neysa Hotter, MD  fluticasone (FLOVENT DISKUS) 50 MCG/BLIST diskus inhaler Inhale 1 puff into the lungs 2 (two) times daily.    [provider]  gabapentin (NEURONTIN) 300 MG capsule Take 1 capsule (300 mg total) by mouth 2 (two) times daily. Patient not taking: Reported on 03/15/2018 07/21/17   Neysa Hotter, MD  lisinopril (PRINIVIL,ZESTRIL) 20 MG tablet  Take 20 mg by mouth daily.    [provider]  metFORMIN (GLUCOPHAGE) 500 MG tablet Take 500 mg by mouth 2 (two) times daily with a meal.     [provider]  montelukast (SINGULAIR) 10 MG tablet Take 10 mg by mouth daily.    [provider]  omeprazole (PRILOSEC) 40 MG capsule Take 40 mg by mouth daily.    [provider]  oxybutynin (DITROPAN) 5 MG tablet Take 1 tablet (5 mg total) by mouth every 8 (eight) hours as needed for bladder spasms. And stent discomfort. Patient not taking: Reported on 03/15/2018 11/25/16   Sebastian Ache, MD  phenytoin (DILANTIN) 100 MG ER capsule Take 300 mg by mouth daily.    [provider]  potassium citrate (UROCIT-K) 10 MEQ (1080 MG) SR tablet Take 1 tablet (10 mEq total) by mouth 2 (two) times daily with a meal. Patient not taking: Reported on 03/15/2018 03/31/17   Sebastian Ache, MD  simvastatin (ZOCOR) 40 MG tablet Take 40 mg by mouth daily.    [provider]  Tiotropium Bromide-Olodaterol (STIOLTO RESPIMAT) 2.5-2.5 MCG/ACT AERS Inhale into the lungs.    [provider]  Vitamin D, Ergocalciferol, (DRISDOL) 50000 units CAPS capsule Take 50,000 Units by mouth every 7 (seven) days.    [provider]    Inpatient Medications: Scheduled Meds: . budesonide (PULMICORT) nebulizer solution  0.25 mg Nebulization BID  . DULoxetine  90 mg Oral Daily  . gabapentin  300 mg Oral BID  . insulin aspart  0-9 Units Subcutaneous TID WC  . lisinopril  20 mg Oral Daily  . montelukast  10 mg Oral Daily  . pantoprazole  40 mg Oral Daily  . phenytoin  300 mg Oral QHS  . simvastatin  40 mg Oral Daily   Continuous Infusions: . sodium chloride 10 mL/hr at 03/16/18 0801  . heparin 1,200 Units/hr (03/16/18 0931)   PRN Meds: acetaminophen **OR** acetaminophen, albuterol, ondansetron **OR** ondansetron (ZOFRAN) IV  Allergies:    Allergies  Allergen Reactions  . Fruit Extracts Nausea And Vomiting     Bananas    Social History:   Social History   Socioeconomic History  . Marital status: Single    Spouse name: Not on file  . Number of children: Not on file  . Years of education: Not on file  . Highest education level: Not on file  Occupational History  . Not on file  Social Needs  . Financial resource strain: Not on file  . Food insecurity:    Worry: Not on file    Inability: Not on file  . Transportation needs:    Medical: Not on file    Non-medical: Not on file  Tobacco Use  . Smoking status: Former Smoker    Types: Cigarettes    Last attempt to quit: 11/12/2016  Years since quitting: 1.3  . Smokeless tobacco: Former Neurosurgeon  . Tobacco comment: 04-08-2017 per pt stopped in Jan 2018  Substance and Sexual Activity  . Alcohol use: Yes    Comment: occassional, 04-08-2017 per pt occas.  . Drug use: No    Comment: 04-08-2017 per pt no   . Sexual activity: Not on file  Lifestyle  . Physical activity:    Days per week: Not on file    Minutes per session: Not on file  . Stress: Not on file  Relationships  . Social connections:    Talks on phone: Not on file    Gets together: Not on file    Attends religious service: Not on file    Active member of club or organization: Not on file    Attends meetings of clubs or organizations: Not on file    Relationship status: Not on file  . Intimate partner violence:    Fear of current or ex partner: Not on file    Emotionally abused: Not on file    Physically abused: Not on file    Forced sexual activity: Not on file  Other Topics Concern  . Not on file  Social History Narrative  . Not on file    Family History:    Family History  Problem Relation Age of Onset  . Prostate cancer Father   . Diabetes Father      ROS:  Please see the history of present illness.  All other ROS reviewed and negative.     Physical Exam/Data:   Vitals:   03/16/18 0715 03/16/18 0800 03/16/18 0829 03/16/18 0900  BP:  128/85  114/72  Pulse:  90 95  91  Resp: 13 18  16   Temp: 98 F (36.7 C)     TempSrc: Oral     SpO2: 94% 95% 94% 95%  Weight:      Height:        Intake/Output Summary (Last 24 hours) at 03/16/2018 1043 Last data filed at 03/16/2018 1610 Gross per 24 hour  Intake 1280 ml  Output -  Net 1280 ml   Filed Weights   03/15/18 2109 03/16/18 0634  Weight: 240 lb (108.9 kg) 237 lb 14 oz (107.9 kg)   Body mass index is 40.83 kg/m.  General:  Obese female. Well nourished, well developed, in no acute distress HEENT: normal Lymph: no adenopathy Neck: no JVD Endocrine:  No thryomegaly Vascular: No carotid bruits; FA pulses 2+ bilaterally without bruits  Cardiac:  normal S1, S2; RRR; no murmur  Lungs:  clear to auscultation bilaterally, no wheezing, rhonchi or rales  Abd: obese, soft, nontender, no hepatomegaly  Ext: no edema Musculoskeletal:  No deformities, BUE and BLE strength normal and equal Skin: warm and dry  Neuro:  CNs 2-12 intact, no focal abnormalities noted Psych:  Normal affect   EKG:  The EKG was personally reviewed and demonstrates:  NSR, no acute changes, QTc 486 Telemetry:  Telemetry was personally reviewed and demonstrates:  NSR  Relevant CV Studies:   Laboratory Data:  Chemistry Recent Labs  Lab 03/15/18 2140  NA 140  K 3.9  CL 104  CO2 25  GLUCOSE 189*  BUN 14  CREATININE 0.69  CALCIUM 9.5  GFRNONAA >60  GFRAA >60  ANIONGAP 11    Recent Labs  Lab 03/15/18 2140  PROT 7.5  ALBUMIN 4.1  AST 19  ALT 30  ALKPHOS 91  BILITOT 0.5   Hematology Recent Labs  Lab 03/15/18 2140  WBC 18.8*  RBC 4.60  HGB 14.5  HCT 41.7  MCV 90.7  MCH 31.5  MCHC 34.8  RDW 12.7  PLT 308   Cardiac Enzymes Recent Labs  Lab 03/15/18 2140 03/16/18 0022 03/16/18 0651  TROPONINI 0.19* 0.82* 1.36*   No results for input(s): TROPIPOC in the last 168 hours.  BNPNo results for input(s): BNP, PROBNP in the last 168 hours.  DDimer  Recent Labs  Lab 03/15/18 2250  DDIMER 0.49    Phenytoin level- < 2.5  Radiology/Studies:  Dg Chest 2 View  Result Date: 03/15/2018 CLINICAL DATA:  57 y/o F; seizure and shortness of breath today. History of asthma, COPD, diabetes, hypertension, and former smoker. EXAM: CHEST - 2 VIEW COMPARISON:  None. FINDINGS: Normal cardiac silhouette. Aortic atherosclerosis with calcification. Clear lungs. No pleural effusion or pneumothorax. No acute osseous abnormality is evident. IMPRESSION: No acute pulmonary process identified.  Aortic atherosclerosis. Electronically Signed   By: Mitzi Hansen M.D.   On: 03/15/2018 22:18   Dg Elbow Complete Right  Result Date: 03/16/2018 CLINICAL DATA:  57 y/o F; seizures today with fall and elbow pain at the ulna. EXAM: RIGHT ELBOW - COMPLETE 3+ VIEW COMPARISON:  None. FINDINGS: There is no evidence of fracture, dislocation, or joint effusion. There is no evidence of arthropathy or other focal bone abnormality. Soft tissues are unremarkable. IMPRESSION: Negative. Electronically Signed   By: Mitzi Hansen M.D.   On: 03/16/2018 03:04   Ct Head Wo Contrast  Result Date: 03/15/2018 CLINICAL DATA:  57 year old female with altered mental status. EXAM: CT HEAD WITHOUT CONTRAST TECHNIQUE: Contiguous axial images were obtained from the base of the skull through the vertex without intravenous contrast. COMPARISON:  None. FINDINGS: Brain: No evidence of acute infarction, hemorrhage, hydrocephalus, extra-axial collection or mass lesion/mass effect. Vascular: No hyperdense vessel or unexpected calcification. Skull: Normal. Negative for fracture or focal lesion. Sinuses/Orbits: Mild diffuse mucoperiosteal thickening of paranasal sinuses. No air-fluid levels. The mastoid air cells are clear. Other: None IMPRESSION: No acute intracranial pathology. Electronically Signed   By: Elgie Collard M.D.   On: 03/15/2018 22:29    Assessment and Plan:   Elevated Troponin- This could be from her seizure, though it is  a little high, will continue to cycle. History of non obstructive CAD reassuring.   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 MD to see. Check an echo for LVF/WMA.   For questions or updates, please contact [facility] Please consult [email or web address] for contact info under Cardiology/STEMI.   Jolene Provost, PA-C  03/16/2018 10:43 AM   Attending note  Patient seen and discussed with PA Diona Fanti, I agree with documentation. 57 yo female history of seizure disorder, COPD, DM2, admitted with witnessed tonic clonic seizure. From notes has been spacing out her seizure meds because in between physicians.   She reports over the last month episodes of SOB, "tightness in trying to breathe". She typically assoiciates this with her asthmas, but breathing treatments don't always seem to improve her symptoms. Notes now trouble just mopping her floor due to SOB.   WBC 18.8 Hgb 14.5 Plt 308 K 3.9 Cr 0.69 D-dimer neg  Trop 0.19-->0.82-->1.36--> CXR no acute  process EKG SR, nonspecific ST/T changes  Admitted with seizure. Troponins elevated, have not peak. Nonspecific symptoms of SOB and "tightness in breathing" over the last month she has associated with her asthma, though unclear if this could be a form of atypical angina in the setting of her DM2. We will need to continue to follow trop trend, obtain echo to evaluate for any systolic dysfunction or WMAs. She does have CAD risk factors including DM2 and tobacco history. . Trop could be related to seizure but if turns out to be severe elevation or abnormal LV function could be a sign of ischemic heart disease. Of note she reports a severe reaction to regadenson in the past. With this and her  elevated trops (exercise and dobutamine are contraindicated), options for noninvasive ischemic testing are very limited and essentially would be limited to a coronary CTA. Continue medical therapy with hep gtt, lisionpril, simva. Start asa 81mg  daily.    Dina Rich MD

## 2018-03-16 NOTE — H&P (Signed)
TRH H&P    Patient Demographics:    Savannah Pena, is a 57 y.o. female  MRN: [id number]  DOB - 19-Dec-1960  Admit Date - 03/15/2018  Referring MD/NP/PA: Dr Manus Gunning  Outpatient Primary MD for the patient is Evelene Croon, MD  Patient coming from: Home  Chief complaint- Seizure   HPI:    Savannah Pena  is a 57 y.o. female, with history of seizure disorder, COPD, ADHD diabetes mellitus, was brought to hospital after patient had a witnessed tonic-clonic seizure at home.  No family at bedside, history obtained from the patient and ED notes.  Patient was sitting on a barstool when she began shaking in her left arm which progressed to entire body.  Patient was lowered to the ground but her significant other but did not lose consciousness or hit her head.  Seizure activity stopped by the time EMS arrived approximately 10 minutes later.  Patient's last seizure was 25 years ago. Patient is taking Dilantin but for past 1 month she was running out of medication and was changing her physician she did not have enough medication so she was spacing it out.  Also patient's car broke down so she could not go and get new medication. In the ED Lab work showed initial troponin of 0.19 which went up to 0.82, CK 270, WBC 18.8.  Dilantin level less than 2.5.  Patient was given loading dose of Dilantin in the ED.  She denies chest pain, no shortness of breath. Denies nausea vomiting or diarrhea. Denies dysuria urgency frequency of urination. No history of stroke. Last seizure was 25 years ago   Review of systems:      All other systems reviewed and are negative.   With Past History of the following :    Past Medical History:  Diagnosis Date  . Anxiety   . Arthritis   . Asthma   . Attention deficit disorder (ADD)   . COPD (chronic obstructive pulmonary disease) (HCC)   . Diabetes mellitus without complication (HCC)   .  Dyspnea    with exertion  . GERD (gastroesophageal reflux disease)   . History of kidney stones   . Hypertension   . Major depression   . Seizures (HCC)    last seizure 23 years ago  . Sleep apnea    has C-PAP, but does not currently use      Past Surgical History:  Procedure Laterality Date  . CESAREAN SECTION    . CYSTOSCOPY WITH RETROGRADE PYELOGRAM, URETEROSCOPY AND STENT PLACEMENT Right 12/05/2016   Procedure: CYSTOSCOPY WITH RETROGRADE PYELOGRAM, URETEROSCOPY AND STENT PLACEMENT;  Surgeon: Sebastian Ache, MD;  Location: [facility] ORS;  Service: Urology;  Laterality: Right;  . CYSTOSCOPY WITH STENT PLACEMENT Right 11/06/2016   Procedure: CYSTOSCOPY WITH STENT PLACEMENT;  Surgeon: Bjorn Pippin, MD;  Location: [facility] ORS;  Service: Urology;  Laterality: Right;  . FLEXOR TENDON REPAIR Right    foot  . KIDNEY STONE SURGERY        Social History:      Social History  Tobacco Use  . Smoking status: Former Smoker    Types: Cigarettes    Last attempt to quit: 11/12/2016    Years since quitting: 1.3  . Smokeless tobacco: Former Neurosurgeon  . Tobacco comment: 04-08-2017 per pt stopped in Jan 2018  Substance Use Topics  . Alcohol use: Yes    Comment: occassional, 04-08-2017 per pt occas.       Family History :     Family History  Problem Relation Age of Onset  . Prostate cancer Father   . Diabetes Father       Home Medications:   Prior to Admission medications   Medication Sig Start Date End Date Taking? Authorizing Provider  albuterol (PROVENTIL HFA;VENTOLIN HFA) 108 (90 Base) MCG/ACT inhaler Inhale 2 puffs into the lungs every 4 (four) hours as needed for wheezing or shortness of breath.    Yes [provider]  albuterol (PROVENTIL) (2.5 MG/3ML) 0.083% nebulizer solution Take 2.5 mg by nebulization every 6 (six) hours as needed for wheezing or shortness of breath.   Yes [provider]  diphenhydrAMINE (BENADRYL) 25 MG tablet Take 25 mg by mouth every 6 (six)  hours as needed for allergies.    Yes [provider]  buPROPion (WELLBUTRIN XL) 150 MG 24 hr tablet Take 1 tablet (150 mg total) by mouth daily. Patient not taking: Reported on 03/15/2018 07/21/17   Neysa Hotter, MD  cetirizine (ZYRTEC) 10 MG tablet Take 10 mg by mouth daily.    [provider]  diclofenac (VOLTAREN) 75 MG EC tablet Take 75 mg by mouth 2 (two) times daily.    [provider]  DULoxetine (CYMBALTA) 30 MG capsule Take 3 capsules (90 mg total) by mouth daily. Patient not taking: Reported on 03/15/2018 07/21/17   Neysa Hotter, MD  fluticasone (FLOVENT DISKUS) 50 MCG/BLIST diskus inhaler Inhale 1 puff into the lungs 2 (two) times daily.    [provider]  gabapentin (NEURONTIN) 300 MG capsule Take 1 capsule (300 mg total) by mouth 2 (two) times daily. Patient not taking: Reported on 03/15/2018 07/21/17   Neysa Hotter, MD  lisinopril (PRINIVIL,ZESTRIL) 20 MG tablet Take 20 mg by mouth daily.    [provider]  metFORMIN (GLUCOPHAGE) 500 MG tablet Take 500 mg by mouth 2 (two) times daily with a meal.     [provider]  montelukast (SINGULAIR) 10 MG tablet Take 10 mg by mouth daily.    [provider]  omeprazole (PRILOSEC) 40 MG capsule Take 40 mg by mouth daily.    [provider]  oxybutynin (DITROPAN) 5 MG tablet Take 1 tablet (5 mg total) by mouth every 8 (eight) hours as needed for bladder spasms. And stent discomfort. Patient not taking: Reported on 03/15/2018 11/25/16   Sebastian Ache, MD  phenytoin (DILANTIN) 100 MG ER capsule Take 300 mg by mouth daily.    [provider]  potassium citrate (UROCIT-K) 10 MEQ (1080 MG) SR tablet Take 1 tablet (10 mEq total) by mouth 2 (two) times daily with a meal. Patient not taking: Reported on 03/15/2018 03/31/17   Sebastian Ache, MD  simvastatin (ZOCOR) 40 MG tablet Take 40 mg by mouth daily.    [provider]  Tiotropium Bromide-Olodaterol (STIOLTO  RESPIMAT) 2.5-2.5 MCG/ACT AERS Inhale into the lungs.    [provider]  Vitamin D, Ergocalciferol, (DRISDOL) 50000 units CAPS capsule Take 50,000 Units by mouth every 7 (seven) days.    [provider]  Allergies:     Allergies  Allergen Reactions  . Fruit Extracts Nausea And Vomiting    Bananas     Physical Exam:   Vitals  Blood pressure 127/75, pulse (!) 105, temperature 98 F (36.7 C), temperature source Oral, resp. rate 17, height 5' (1.524 m), weight 108.9 kg (240 lb), last menstrual period 12/06/2011, SpO2 96 %.  1.  General: Appears lethargic  2. Psychiatric:  Intact judgement and  insight, awake alert, oriented x 3.  3. Neurologic: No focal neurological deficits, all cranial nerves intact.Strength 5/5 all 4 extremities, sensation intact all 4 extremities, plantars down going.  4. Eyes :  anicteric sclerae, moist conjunctivae with no lid lag. PERRLA.  5. ENMT:  Oropharynx clear with moist mucous membranes and good dentition  6. Neck:  supple, no cervical lymphadenopathy appriciated, No thyromegaly  7. Respiratory : Normal respiratory effort, good air movement bilaterally,clear to  auscultation bilaterally  8. Cardiovascular : RRR, no gallops, rubs or murmurs, no leg edema  9. Gastrointestinal:  Positive bowel sounds, abdomen soft, non-tender to palpation,no hepatosplenomegaly, no rigidity or guarding       10. Skin:  No cyanosis, normal texture and turgor, no rash, lesions or ulcers  11.Musculoskeletal:  Good muscle tone,  joints appear normal , no effusions,  normal range of motion    Data Review:    CBC Recent Labs  Lab 03/15/18 2140  WBC 18.8*  HGB 14.5  HCT 41.7  PLT 308  MCV 90.7  MCH 31.5  MCHC 34.8  RDW 12.7  LYMPHSABS 2.2  MONOABS 1.0  EOSABS 0.8*  BASOSABS 0.0   ------------------------------------------------------------------------------------------------------------------  Chemistries  Recent Labs    Lab 03/15/18 2140  NA 140  K 3.9  CL 104  CO2 25  GLUCOSE 189*  BUN 14  CREATININE 0.69  CALCIUM 9.5  AST 19  ALT 30  ALKPHOS 91  BILITOT 0.5   ------------------------------------------------------------------------------------------------------------------  ------------------------------------------------------------------------------------------------------------------ GFR: Estimated Creatinine Clearance: 88.9 mL/min (by C-G formula based on SCr of 0.69 mg/dL). Liver Function Tests: Recent Labs  Lab 03/15/18 2140  AST 19  ALT 30  ALKPHOS 91  BILITOT 0.5  PROT 7.5  ALBUMIN 4.1   No results for input(s): LIPASE, AMYLASE in the last 168 hours. No results for input(s): AMMONIA in the last 168 hours. Coagulation Profile: Recent Labs  Lab 03/16/18 0205  INR 1.02   Cardiac Enzymes: Recent Labs  Lab 03/15/18 2140 03/16/18 0022  CKTOTAL  --  270*  TROPONINI 0.19* 0.82*   BNP (last 3 results) No results for input(s): PROBNP in the last 8760 hours. HbA1C: No results for input(s): HGBA1C in the last 72 hours. CBG: Recent Labs  Lab 03/15/18 2129  GLUCAP 217*   Lipid Profile: No results for input(s): CHOL, HDL, LDLCALC, TRIG, CHOLHDL, LDLDIRECT in the last 72 hours. Thyroid Function Tests: No results for input(s): TSH, T4TOTAL, FREET4, T3FREE, THYROIDAB in the last 72 hours. Anemia Panel: No results for input(s): VITAMINB12, FOLATE, FERRITIN, TIBC, IRON, RETICCTPCT in the last 72 hours.  --------------------------------------------------------------------------------------------------------------- Urine analysis:    Component Value Date/Time   COLORURINE YELLOW 03/16/2018 0019   APPEARANCEUR HAZY (A) 03/16/2018 0019   LABSPEC 1.016 03/16/2018 0019   PHURINE 5.0 03/16/2018 0019   GLUCOSEU NEGATIVE 03/16/2018 0019   HGBUR NEGATIVE 03/16/2018 0019   BILIRUBINUR NEGATIVE 03/16/2018 0019   KETONESUR NEGATIVE 03/16/2018 0019   PROTEINUR NEGATIVE 03/16/2018  0019   NITRITE NEGATIVE 03/16/2018 0019   LEUKOCYTESUR NEGATIVE 03/16/2018 0019  Imaging Results:    Dg Chest 2 View  Result Date: 03/15/2018 CLINICAL DATA:  57 y/o F; seizure and shortness of breath today. History of asthma, COPD, diabetes, hypertension, and former smoker. EXAM: CHEST - 2 VIEW COMPARISON:  None. FINDINGS: Normal cardiac silhouette. Aortic atherosclerosis with calcification. Clear lungs. No pleural effusion or pneumothorax. No acute osseous abnormality is evident. IMPRESSION: No acute pulmonary process identified.  Aortic atherosclerosis. Electronically Signed   By: Mitzi Hansen M.D.   On: 03/15/2018 22:18   Dg Elbow Complete Right  Result Date: 03/16/2018 CLINICAL DATA:  57 y/o F; seizures today with fall and elbow pain at the ulna. EXAM: RIGHT ELBOW - COMPLETE 3+ VIEW COMPARISON:  None. FINDINGS: There is no evidence of fracture, dislocation, or joint effusion. There is no evidence of arthropathy or other focal bone abnormality. Soft tissues are unremarkable. IMPRESSION: Negative. Electronically Signed   By: Mitzi Hansen M.D.   On: 03/16/2018 03:04   Ct Head Wo Contrast  Result Date: 03/15/2018 CLINICAL DATA:  57 year old female with altered mental status. EXAM: CT HEAD WITHOUT CONTRAST TECHNIQUE: Contiguous axial images were obtained from the base of the skull through the vertex without intravenous contrast. COMPARISON:  None. FINDINGS: Brain: No evidence of acute infarction, hemorrhage, hydrocephalus, extra-axial collection or mass lesion/mass effect. Vascular: No hyperdense vessel or unexpected calcification. Skull: Normal. Negative for fracture or focal lesion. Sinuses/Orbits: Mild diffuse mucoperiosteal thickening of paranasal sinuses. No air-fluid levels. The mastoid air cells are clear. Other: None IMPRESSION: No acute intracranial pathology. Electronically Signed   By: Elgie Collard M.D.   On: 03/15/2018 22:29    My personal review of EKG:  Rhythm NSR, no ST changes   Assessment & Plan:    Active Problems:   Seizure (HCC)   1. Seizure-secondary to subtherapeutic Dilantin level.  Dilantin loading dose have been given in the ED.  Continue Dilantin 300 mg p.o. daily.  Will consult neurology for further recommendations in a.m. 2. Elevated troponin-patient has elevated troponin 0 0.82 likely from seizure, EKG shows no ST changes.  Patient has been started on heparin protocol in the ED, will consult cardiology in a.m. for further recommendations. 3. Diabetes mellitus-we will initiate sliding scale insulin with NovoLog. 4. Hypertension-continue lisinopril 5. Depression-patient t has been on Wellbutrin, Cymbalta at home we will continue with Cymbalta,  patient stopped taking Wellbutrin   DVT Prophylaxis-   Heparin  AM Labs Ordered, also please review Full Orders  Family Communication: Admission, patients condition and plan of care including tests being ordered have been discussed with the patient  who indicate understanding and agree with the plan and Code Status.  Code Status:  Full code  Admission status: Inpatient    Time spent in minutes : 60 min   Meredeth Ide M.D on 03/16/2018 at 5:25 AM  Between 7am to 7pm - Pager - [phone]. After 7pm go to [email or web address] - password [facility]  [facility] - Office  [phone]

## 2018-03-16 NOTE — Progress Notes (Signed)
ANTICOAGULATION CONSULT NOTE   Pharmacy Consult for heparin Indication: chest pain/ACS  Allergies  Allergen Reactions  . Fruit Extracts Nausea And Vomiting    Bananas   Patient Measurements: Height: 5\' 4"  (162.6 cm) Weight: 237 lb 14 oz (107.9 kg) IBW/kg (Calculated) : 54.7 HEPARIN DW (KG): 80.2  Vital Signs: Temp: 98.2 F (36.8 C) (05/07 1113) Temp Source: Oral (05/07 1113) BP: 118/75 (05/07 1100) Pulse Rate: 93 (05/07 1113)  Labs: Recent Labs    03/15/18 2140 03/16/18 0022 03/16/18 0205 03/16/18 0651 03/16/18 0655 03/16/18 1237 03/16/18 1430  HGB 14.5  --   --   --   --   --   --   HCT 41.7  --   --   --   --   --   --   PLT 308  --   --   --   --   --   --   APTT  --   --  86*  --   --   --   --   LABPROT  --   --  13.3  --   --   --   --   INR  --   --  1.02  --   --   --   --   HEPARINUNFRC  --   --   --   --  <0.10*  --  0.13*  CREATININE 0.69  --   --   --   --   --   --   CKTOTAL  --  270*  --   --   --   --   --   TROPONINI 0.19* 0.82*  --  1.36*  --  1.64*  --    Estimated Creatinine Clearance: 95.3 mL/min (by C-G formula based on SCr of 0.69 mg/dL).  Medical History: Past Medical History:  Diagnosis Date  . Anxiety   . Arthritis   . Asthma   . Attention deficit disorder (ADD)   . COPD (chronic obstructive pulmonary disease) (HCC)   . Diabetes mellitus without complication (HCC)   . Dyspnea    with exertion  . GERD (gastroesophageal reflux disease)   . History of kidney stones   . Hypertension   . Major depression   . Seizures (HCC)    last seizure 23 years ago  . Sleep apnea    has C-PAP, but does not currently use   Medications:  Scheduled:  . aspirin EC  81 mg Oral Daily  . budesonide (PULMICORT) nebulizer solution  0.25 mg Nebulization BID  . DULoxetine  90 mg Oral Daily  . gabapentin  300 mg Oral BID  . heparin  2,000 Units Intravenous Once  . insulin aspart  0-9 Units Subcutaneous TID WC  . lisinopril  20 mg Oral Daily  .  loratadine  10 mg Oral Daily  . metoprolol succinate  12.5 mg Oral Daily  . montelukast  10 mg Oral Daily  . pantoprazole  40 mg Oral Daily  . phenytoin  300 mg Oral QHS  . simvastatin  40 mg Oral Daily   Infusions:  . sodium chloride 10 mL/hr at 03/16/18 0801  . heparin 1,200 Units/hr (03/16/18 0931)   PRN:  Anti-infectives (From admission, onward)   None     Assessment: 57 yo in ED for seizures, SOB.  Troponin elevated, starting heparin.  Heparin level is not at goal.   Goal of Therapy:  Heparin level 0.3-0.7 units/ml  Plan:  Heparin rebolus 2000 units now x 1 Increase Heparin infusion to 1500 units/hr Recheck heparin level in ~ 6 hrs CBC and Heparin level daily while on Heparin  Valrie Hart A, RPH 03/16/2018,3:59 PM

## 2018-03-16 NOTE — Progress Notes (Addendum)
The patient Savannah Pena has current phenytoin level of  Phenytoin Lvl  Date Value Ref Range Status  03/15/2018 <2.5 (L) 10.0 - 20.0 ug/mL Final    Comment:    Performed at [facility], [address] [place], [place] [address]    Other pertinent lab values include: No components found for: LABALBU Creatinine, Ser  Date Value Ref Range Status  03/15/2018 0.69 0.44 - 1.00 mg/dL Final   The patient will be loaded with 1000 mg of phenytoin by Oral route to result in a level of 15-20 mcg/mL.  Will continue to monitor phenytoin levels, albumin and creatinine as needed for dosing adjustments.  Doses are based on the following calculations:  Phenytoin level undetectable. Will be loading in 3 divided doses 2 hours apart.  400mg  followed by 300mg  x 2.  Loading dose = 0.7 (20 - <2.5) 64.5 kg  Jeani Hawking pharmacist to address maintenance dosing and levels tomorrow.    QMVHQIO, Berneice Heinrich

## 2018-03-16 NOTE — Progress Notes (Addendum)
03/16/2018 6:26 AM  Savannah Pena was seen and examined.  The H&P by the admitting provider, orders, imaging was reviewed.  Please see new orders.  Will continue to follow.  I feel the elevated troponin likely demand ischemia from tonic clonic seizure activity.  Pt not complaining of chest pain but is complaining of SOB.  Will obtain STAT EKG.  Pt now on heparin drip.  Cardiology consulted by admitting MD. Neurology consulted by admitting MD.   Vitals:   03/16/18 0600 03/16/18 0615  BP: 125/76   Pulse: 94 95  Resp: 20 16  Temp:    SpO2: 97% 99%   Results for orders placed or performed during the hospital encounter of 03/15/18  Troponin I  Result Value Ref Range   Troponin I 0.19 (HH) <0.03 ng/mL  CBC with Differential  Result Value Ref Range   WBC 18.8 (H) 4.0 - 10.5 K/uL   RBC 4.60 3.87 - 5.11 MIL/uL   Hemoglobin 14.5 12.0 - 15.0 g/dL   HCT 28.4 13.2 - 44.0 %   MCV 90.7 78.0 - 100.0 fL   MCH 31.5 26.0 - 34.0 pg   MCHC 34.8 30.0 - 36.0 g/dL   RDW 10.2 72.5 - 36.6 %   Platelets 308 150 - 400 K/uL   Neutrophils Relative % 78 %   Neutro Abs 14.8 (H) 1.7 - 7.7 K/uL   Lymphocytes Relative 12 %   Lymphs Abs 2.2 0.7 - 4.0 K/uL   Monocytes Relative 5 %   Monocytes Absolute 1.0 0.1 - 1.0 K/uL   Eosinophils Relative 5 %   Eosinophils Absolute 0.8 (H) 0.0 - 0.7 K/uL   Basophils Relative 0 %   Basophils Absolute 0.0 0.0 - 0.1 K/uL  Urinalysis, Routine w reflex microscopic  Result Value Ref Range   Color, Urine YELLOW YELLOW   APPearance HAZY (A) CLEAR   Specific Gravity, Urine 1.016 1.005 - 1.030   pH 5.0 5.0 - 8.0   Glucose, UA NEGATIVE NEGATIVE mg/dL   Hgb urine dipstick NEGATIVE NEGATIVE   Bilirubin Urine NEGATIVE NEGATIVE   Ketones, ur NEGATIVE NEGATIVE mg/dL   Protein, ur NEGATIVE NEGATIVE mg/dL   Nitrite NEGATIVE NEGATIVE   Leukocytes, UA NEGATIVE NEGATIVE  Comprehensive metabolic panel  Result Value Ref Range   Sodium 140 135 - 145 mmol/L   Potassium 3.9 3.5 - 5.1 mmol/L    Chloride 104 101 - 111 mmol/L   CO2 25 22 - 32 mmol/L   Glucose, Bld 189 (H) 65 - 99 mg/dL   BUN 14 6 - 20 mg/dL   Creatinine, Ser 4.40 0.44 - 1.00 mg/dL   Calcium 9.5 8.9 - 34.7 mg/dL   Total Protein 7.5 6.5 - 8.1 g/dL   Albumin 4.1 3.5 - 5.0 g/dL   AST 19 15 - 41 U/L   ALT 30 14 - 54 U/L   Alkaline Phosphatase 91 38 - 126 U/L   Total Bilirubin 0.5 0.3 - 1.2 mg/dL   GFR calc non Af Amer >60 >60 mL/min   GFR calc Af Amer >60 >60 mL/min   Anion gap 11 5 - 15  Acetaminophen level  Result Value Ref Range   Acetaminophen (Tylenol), Serum <10 (L) 10 - 30 ug/mL  Ethanol  Result Value Ref Range   Alcohol, Ethyl (B) <10 <10 mg/dL  Salicylate level  Result Value Ref Range   Salicylate Lvl <7.0 2.8 - 30.0 mg/dL  Phenytoin level, total  Result Value Ref Range   Phenytoin  Lvl <2.5 (L) 10.0 - 20.0 ug/mL  D-dimer, quantitative (not at [facility])  Result Value Ref Range   D-Dimer, Quant 0.49 0.00 - 0.50 ug/mL-FEU  Troponin I  Result Value Ref Range   Troponin I 0.82 (HH) <0.03 ng/mL  CK  Result Value Ref Range   Total CK 270 (H) 38 - 234 U/L  APTT  Result Value Ref Range   aPTT 86 (H) 24 - 36 seconds  Protime-INR  Result Value Ref Range   Prothrombin Time 13.3 11.4 - 15.2 seconds   INR 1.02   CBG monitoring, ED  Result Value Ref Range   Glucose-Capillary 217 (H) 65 - 99 mg/dL    Maryln Manuel, MD [facility]

## 2018-03-16 NOTE — ED Notes (Signed)
CRITICAL VALUE ALERT  Critical Value:  Troponin 0.82  Date & Time Notied:  03/16/18 0112  Provider Notified: Rancour EDP  Orders Received/Actions taken: No orders at this time

## 2018-03-16 NOTE — Progress Notes (Signed)
ANTICOAGULATION CONSULT NOTE   Pharmacy Consult for heparin Indication: chest pain/ACS  Allergies  Allergen Reactions  . Fruit Extracts Nausea And Vomiting    Bananas   Patient Measurements: Height: 5\' 4"  (162.6 cm) Weight: 237 lb 14 oz (107.9 kg) IBW/kg (Calculated) : 54.7 HEPARIN DW (KG): 80.2  Vital Signs: Temp: 98 F (36.7 C) (05/07 0715) Temp Source: Oral (05/07 0715) BP: 114/72 (05/07 0900) Pulse Rate: 91 (05/07 0900)  Labs: Recent Labs    03/15/18 2140 03/16/18 0022 03/16/18 0205 03/16/18 0651 03/16/18 0655  HGB 14.5  --   --   --   --   HCT 41.7  --   --   --   --   PLT 308  --   --   --   --   APTT  --   --  86*  --   --   LABPROT  --   --  13.3  --   --   INR  --   --  1.02  --   --   HEPARINUNFRC  --   --   --   --  <0.10*  CREATININE 0.69  --   --   --   --   CKTOTAL  --  270*  --   --   --   TROPONINI 0.19* 0.82*  --  1.36*  --    Estimated Creatinine Clearance: 95.3 mL/min (by C-G formula based on SCr of 0.69 mg/dL).  Medical History: Past Medical History:  Diagnosis Date  . Anxiety   . Arthritis   . Asthma   . Attention deficit disorder (ADD)   . COPD (chronic obstructive pulmonary disease) (HCC)   . Diabetes mellitus without complication (HCC)   . Dyspnea    with exertion  . GERD (gastroesophageal reflux disease)   . History of kidney stones   . Hypertension   . Major depression   . Seizures (HCC)    last seizure 23 years ago  . Sleep apnea    has C-PAP, but does not currently use   Medications:  Scheduled:  . budesonide (PULMICORT) nebulizer solution  0.25 mg Nebulization BID  . DULoxetine  90 mg Oral Daily  . gabapentin  300 mg Oral BID  . insulin aspart  0-9 Units Subcutaneous TID WC  . lisinopril  20 mg Oral Daily  . montelukast  10 mg Oral Daily  . pantoprazole  40 mg Oral Daily  . phenytoin  300 mg Oral QHS  . simvastatin  40 mg Oral Daily   Infusions:  . sodium chloride 10 mL/hr at 03/16/18 0801  . heparin 900  Units/hr (03/16/18 0158)   PRN:  Anti-infectives (From admission, onward)   None     Assessment: 57 yo in ED for seizures, SOB.  Troponin elevated, starting heparin.  Heparin level is not at goal.   Goal of Therapy:  Heparin level 0.3-0.7 units/ml   Plan:  Increase Heparin infusion to 1200 units/hr Recheck heparin level in ~ 6 hrs CBC and Heparin level daily while on Heparin  Valrie Hart A, RPH 03/16/2018,9:27 AM

## 2018-03-16 NOTE — Progress Notes (Signed)
ANTICOAGULATION CONSULT NOTE - Preliminary  Pharmacy Consult for heparin Indication: chest pain/ACS  Allergies  Allergen Reactions  . Fruit Extracts Nausea And Vomiting    Bananas    Patient Measurements: Height: 5' (152.4 cm) Weight: 240 lb (108.9 kg) IBW/kg (Calculated) : 45.5 HEPARIN DW (KG): 72.5   Vital Signs: Temp: 98 F (36.7 C) (05/06 2107) Temp Source: Oral (05/06 2107) BP: 131/78 (05/07 0130) Pulse Rate: 107 (05/07 0130)  Labs: Recent Labs    03/15/18 2140 03/16/18 0022  HGB 14.5  --   HCT 41.7  --   PLT 308  --   CREATININE 0.69  --   CKTOTAL  --  270*  TROPONINI 0.19* 0.82*   Estimated Creatinine Clearance: 88.9 mL/min (by C-G formula based on SCr of 0.69 mg/dL).  Medical History: Past Medical History:  Diagnosis Date  . Anxiety   . Arthritis   . Asthma   . Attention deficit disorder (ADD)   . COPD (chronic obstructive pulmonary disease) (HCC)   . Diabetes mellitus without complication (HCC)   . Dyspnea    with exertion  . GERD (gastroesophageal reflux disease)   . History of kidney stones   . Hypertension   . Major depression   . Seizures (HCC)    last seizure 23 years ago  . Sleep apnea    has C-PAP, but does not currently use    Medications:  Scheduled:  . heparin  4,000 Units Intravenous Once  . phenytoin  300 mg Oral Q2H   Infusions:  . heparin     PRN:  Anti-infectives (From admission, onward)   None      Assessment: 57 yo in ED for seizures, SOB.  Troponin elevated, starting heparin.    Goal of Therapy:  Heparin level 0.3-0.7 units/ml   Plan:  Give 4000 units bolus x 1 Start heparin infusion at 900 units/hr Check anti-Xa level in 6 hours and daily while on heparin Continue to monitor H&H and platelets Preliminary review of pertinent patient information completed.  Jeani Hawking clinical pharmacist will complete review during morning rounds to assess the patient and finalize treatment regimen.  Bri Wakeman  Scarlett, RPH 03/16/2018,1:46 AM

## 2018-03-17 ENCOUNTER — Inpatient Hospital Stay (HOSPITAL_COMMUNITY)
Admission: EM | Disposition: A | Discharge status: Home / Self Care | Payer: Self-pay | Source: Home / Self Care | Attending: Family Medicine

## 2018-03-17 DIAGNOSIS — I214 Non-ST elevation (NSTEMI) myocardial infarction: Secondary | ICD-10-CM

## 2018-03-17 DIAGNOSIS — J449 Chronic obstructive pulmonary disease, unspecified: Secondary | ICD-10-CM

## 2018-03-17 DIAGNOSIS — I428 Other cardiomyopathies: Secondary | ICD-10-CM

## 2018-03-17 DIAGNOSIS — I2510 Atherosclerotic heart disease of native coronary artery without angina pectoris: Secondary | ICD-10-CM

## 2018-03-17 DIAGNOSIS — H3530 Unspecified macular degeneration: Secondary | ICD-10-CM

## 2018-03-17 DIAGNOSIS — I429 Cardiomyopathy, unspecified: Secondary | ICD-10-CM

## 2018-03-17 DIAGNOSIS — I519 Heart disease, unspecified: Secondary | ICD-10-CM

## 2018-03-17 HISTORY — PX: LEFT HEART CATH AND CORONARY ANGIOGRAPHY: CATH118249

## 2018-03-17 LAB — COMPREHENSIVE METABOLIC PANEL
ALT: 29 U/L (ref 14–54)
ANION GAP: 9 (ref 5–15)
AST: 24 U/L (ref 15–41)
Albumin: 3.7 g/dL (ref 3.5–5.0)
Alkaline Phosphatase: 77 U/L (ref 38–126)
BUN: 17 mg/dL (ref 6–20)
CHLORIDE: 106 mmol/L (ref 101–111)
CO2: 26 mmol/L (ref 22–32)
Calcium: 8.8 mg/dL — ABNORMAL LOW (ref 8.9–10.3)
Creatinine, Ser: 0.64 mg/dL (ref 0.44–1.00)
GFR calc non Af Amer: >60 mL/min (ref >60)
Glucose, Bld: 136 mg/dL — ABNORMAL HIGH (ref 65–99)
Potassium: 4 mmol/L (ref 3.5–5.1)
SODIUM: 141 mmol/L (ref 135–145)
Total Bilirubin: 0.6 mg/dL (ref 0.3–1.2)
Total Protein: 6.4 g/dL — ABNORMAL LOW (ref 6.5–8.1)

## 2018-03-17 LAB — GLUCOSE, CAPILLARY
GLUCOSE-CAPILLARY: 132 mg/dL — AB (ref 65–99)
Glucose-Capillary: 135 mg/dL — ABNORMAL HIGH (ref 65–99)
Glucose-Capillary: 143 mg/dL — ABNORMAL HIGH (ref 65–99)
Glucose-Capillary: 149 mg/dL — ABNORMAL HIGH (ref 65–99)

## 2018-03-17 LAB — CBC
HCT: 39 % (ref 36.0–46.0)
Hemoglobin: 12.7 g/dL (ref 12.0–15.0)
MCH: 30.4 pg (ref 26.0–34.0)
MCHC: 32.6 g/dL (ref 30.0–36.0)
MCV: 93.3 fL (ref 78.0–100.0)
Platelets: 260 10*3/uL (ref 150–400)
RBC: 4.18 MIL/uL (ref 3.87–5.11)
RDW: 13 % (ref 11.5–15.5)
WBC: 10.5 10*3/uL (ref 4.0–10.5)

## 2018-03-17 LAB — LIPID PANEL
CHOL/HDL RATIO: 4.1 ratio
CHOLESTEROL: 199 mg/dL (ref 0–200)
HDL: 48 mg/dL (ref >40)
LDL Cholesterol: 89 mg/dL (ref 0–99)
TRIGLYCERIDES: 309 mg/dL — AB (ref <150)
VLDL: 62 mg/dL — AB (ref 0–40)

## 2018-03-17 LAB — HEPARIN LEVEL (UNFRACTIONATED): HEPARIN UNFRACTIONATED: 0.23 [IU]/mL — AB (ref 0.30–0.70)

## 2018-03-17 LAB — HIV ANTIBODY (ROUTINE TESTING W REFLEX): HIV Screen 4th Generation wRfx: NONREACTIVE

## 2018-03-17 LAB — MRSA PCR SCREENING: MRSA by PCR: NEGATIVE

## 2018-03-17 SURGERY — LEFT HEART CATH AND CORONARY ANGIOGRAPHY
Anesthesia: LOCAL

## 2018-03-17 MED ORDER — SODIUM CHLORIDE 0.9 % IV SOLN
INTRAVENOUS | Status: AC
  Administered 2018-03-17: 19:00:00 via INTRAVENOUS

## 2018-03-17 MED ORDER — LIDOCAINE HCL (PF) 1 % IJ SOLN
INTRAMUSCULAR | Status: DC | PRN
  Administered 2018-03-17: 2 mL

## 2018-03-17 MED ORDER — LIDOCAINE HCL (PF) 1 % IJ SOLN
INTRAMUSCULAR | Status: AC
  Filled 2018-03-17: qty 30

## 2018-03-17 MED ORDER — FENTANYL CITRATE (PF) 100 MCG/2ML IJ SOLN
INTRAMUSCULAR | Status: DC | PRN
  Administered 2018-03-17: 25 ug via INTRAVENOUS

## 2018-03-17 MED ORDER — VERAPAMIL HCL 2.5 MG/ML IV SOLN
INTRAVENOUS | Status: DC | PRN
  Administered 2018-03-17: 10 mL via INTRA_ARTERIAL

## 2018-03-17 MED ORDER — MIDAZOLAM HCL 2 MG/2ML IJ SOLN
INTRAMUSCULAR | Status: AC
  Filled 2018-03-17: qty 2

## 2018-03-17 MED ORDER — IOHEXOL 350 MG/ML SOLN
INTRAVENOUS | Status: DC | PRN
  Administered 2018-03-17: 85 mL via INTRA_ARTERIAL

## 2018-03-17 MED ORDER — SODIUM CHLORIDE 0.9 % IV SOLN: 250.0000 mL | INTRAVENOUS | Status: DC | PRN

## 2018-03-17 MED ORDER — HEPARIN (PORCINE) IN NACL 2-0.9 UNITS/ML
INTRAMUSCULAR | Status: AC | PRN
  Administered 2018-03-17 (×2): 500 mL

## 2018-03-17 MED ORDER — MIDAZOLAM HCL 2 MG/2ML IJ SOLN
INTRAMUSCULAR | Status: DC | PRN
  Administered 2018-03-17: 2 mg via INTRAVENOUS

## 2018-03-17 MED ORDER — LISINOPRIL 10 MG PO TABS
10.0000 mg | ORAL_TABLET | Freq: Every day | ORAL | Status: DC
  Administered 2018-03-18: 10 mg via ORAL
  Filled 2018-03-17: qty 1

## 2018-03-17 MED ORDER — HEPARIN BOLUS VIA INFUSION
2000.0000 [IU] | Freq: Once | INTRAVENOUS | Status: AC
  Administered 2018-03-17: 2000 [IU] via INTRAVENOUS
  Filled 2018-03-17: qty 2000

## 2018-03-17 MED ORDER — HEPARIN SODIUM (PORCINE) 1000 UNIT/ML IJ SOLN
INTRAMUSCULAR | Status: AC
  Filled 2018-03-17: qty 1

## 2018-03-17 MED ORDER — HEPARIN SODIUM (PORCINE) 1000 UNIT/ML IJ SOLN
INTRAMUSCULAR | Status: DC | PRN
  Administered 2018-03-17: 5000 [IU] via INTRAVENOUS

## 2018-03-17 MED ORDER — SODIUM CHLORIDE 0.9% FLUSH
3.0000 mL | Freq: Two times a day (BID) | INTRAVENOUS | Status: DC
  Administered 2018-03-17: 3 mL via INTRAVENOUS

## 2018-03-17 MED ORDER — LISINOPRIL 10 MG PO TABS: 10.0000 mg | ORAL_TABLET | Freq: Every day | ORAL | Status: DC

## 2018-03-17 MED ORDER — VERAPAMIL HCL 2.5 MG/ML IV SOLN
INTRAVENOUS | Status: AC
  Filled 2018-03-17: qty 2

## 2018-03-17 MED ORDER — SODIUM CHLORIDE 0.9% FLUSH: 3.0000 mL | INTRAVENOUS | Status: DC | PRN

## 2018-03-17 MED ORDER — HEPARIN (PORCINE) IN NACL 1000-0.9 UT/500ML-% IV SOLN
INTRAVENOUS | Status: AC
  Filled 2018-03-17: qty 1000

## 2018-03-17 MED ORDER — FENTANYL CITRATE (PF) 100 MCG/2ML IJ SOLN
INTRAMUSCULAR | Status: AC
  Filled 2018-03-17: qty 2

## 2018-03-17 SURGICAL SUPPLY — 12 items

## 2018-03-17 NOTE — Progress Notes (Signed)
Heparin at 1800U on arrival to holding; turned off

## 2018-03-17 NOTE — Discharge Instructions (Signed)
Epilepsy Epilepsy is when a person keeps having seizures. A seizure is unusual activity in the brain. A seizure can change how you think or behave, and it can make it hard to be aware of what is happening. This condition can cause problems, such as:  Falls, accidents, and injury.  Depression.  Poor memory.  Sudden unexplained death in epilepsy (SUDEP). This is rare. Its cause is not known.  Most people with epilepsy lead normal lives. Follow these instructions at home: Medicines   Take medicines only as told by your doctor.  Avoid anything that may keep your medicine from working, such as alcohol. Activity  Get enough rest. Lack of sleep can make seizures more likely to occur.  Follow your doctors advice about driving, swimming, and doing anything else that would be dangerous if you had a seizure. Teaching others Teach friends and family what to do if you have a seizure. They should:  Lay you on the ground to prevent a fall.  Cushion your head and body.  Loosen any tight clothing around your neck.  Turn you on your side.  Stay with you until you are better.  Not hold you down.  Not put anything in your mouth.  Know whether or not you need emergency care.  General instructions  Avoid anything that causes you to have seizures.  Keep a seizure diary. Write down what you remember about each seizure, and especially what might have caused it.  Keep all follow-up visits as told by your doctor. This is important. Contact a doctor if:  You have a change in your seizure pattern.  You get an infection or start to feel sick. You may have more seizures when you are sick. Get help right away if:  A seizure does not stop after 5 minutes.  You have more than one seizure in a row, and you do not have enough time between the seizures to feel better.  A seizure makes it harder to breathe.  A seizure is different from other seizures you have had.  A seizure makes you  unable to speak or use a part of your body.  You did not wake up right after a seizure. This information is not intended to replace advice given to you by your health care provider. Make sure you discuss any questions you have with your health care provider. Document Released: 08/24/2009 Document Revised: 06/02/2016 Document Reviewed: 05/06/2016 Elsevier Interactive Patient Education  2018 [employer].  Cardiomyopathy, Adult Cardiomyopathy is a long-term (chronic) disease of the heart muscle. The disease makes the heart muscle thick, weak, or stiff. As a result, the heart works harder to pump blood. Over time, cardiomyopathy can lead to heart failure. There are several types of cardiomyopathy:  Dilated cardiomyopathy. This type causes the ventricles to become weak and stretched out.  Hypertrophic cardiomyopathy. This type causes the heart muscle to thicken.  Restrictive cardiomyopathy. This type causes the heart muscle to become stiff.  Ischemic cardiomyopathy. This type involves narrowing arteries that cause the walls of the heart to get thinner.  Peripartum cardiomyopathy. This type occurs during pregnancy or shortly after pregnancy.  What are the causes? This condition may be caused by:  A gene that is passed down (inherited) from a family member.  A medical condition that damages the heart, such as: ? Diabetes. ? High blood pressure. ? Viral infection of the heart. ? Heart attack. ? Coronary heart disease.  Alcoholism.  Using illegal drugs or some prescription medicines.  Pregnancy.  Your body absorbing and storing too much iron (hemochromatosis).  Autoimmune diseases, connective tissue diseases, endocrine diseases, and muscle diseases.  Cancer treatments.  Buildup of proteins in your organs (amyloidosis), or inflammation in your organs (sarcoidosis).  Often, the cause is not known. What increases the risk? This condition is more likely to develop in people  who:  Have a family history of cardiomyopathy or other heart problems.  Are overweight or obese.  Use illegal drugs.  Abuse alcohol.  Have a medical condition that damages the heart.  What are the signs or symptoms? Symptoms of this condition include:  Shortness of breath, especially during activity.  Fatigue.  An irregular heartbeat and heart murmurs.  Dizziness.  Light-headedness.  Fainting.  Chest pain.  Coughing.  Swelling in the lower legs, ankles, feet, abdomen, and neck veins.  Often, people with this condition have no symptoms. How is this diagnosed? This condition is diagnosed based on:  Your symptoms and medical history.  A physical exam.  Tests.  Tests may include:  Blood tests.  Imaging studies of your heart, such as: ? X-rays. ? An echocardiogram. ? An MRI.  An electrocardiogram (ECG). This records your heart's electrical activity.  A test in which you wear a portable device (event monitor) to record your heart's electrical activity while you go about your day.  A stress test. This monitors your heart's activity while exercising.  Cardiac catheterization. This procedure checks the blood pressure and blood flow in your heart.  An angiogram. This is an injection of dye into your arteries before imaging studies are taken.  Heart tissue biopsy. This removes a sample of heart tissue for examination.  How is this treated?  Treatment for this condition depends on the type of cardiomyopathy you have and the severity of your symptoms. If you do not have symptoms, you may not need treatment. If you need treatment, it may include:  Lifestyle changes, such as: ? Eating a heart-healthy diet that includes plenty of fruits, vegetables, and whole grains, and cutting down on salt (sodium). ? Maintaining a healthy weight, and losing weight, if needed. ? Getting regular exercise. ? Quitting smoking, if you smoke. ? Avoiding alcohol. ? Medicine  to:  Lower your blood pressure.  Slow down your heart rate.  Keep your heart beating in a steady rhythm.  Clear excess fluids from your body.  Prevent blood clots.  Balance minerals (electrolytes) in your body and get rid of extra sodium in your body.  Reduce inflammation.  Strengthen your heartbeat. ? Surgery to:  Repair a defect.  Remove thickened tissue.  Destroy tissues in the area of abnormal electrical activity (ablation).  Implant a device to treat serious heart rhythm problems (implantable cardioverter-defibrillator, or ICD), or a pacemaker.  Replace your heart (heart transplant) if all other treatments have failed (end stage).  Other treatments may include cardiac resynchronization therapy (CRT) or a left ventricular assist device (LVAD). Follow these instructions at home: Lifestyle  Eat a heart-healthy diet. Work with your health care provider or a registered dietitian to learn about healthy eating options.  Maintain a healthy weight.  Stay physically active. Ask your health care provider to suggest some activities that are good for you.  Do not use any products that contain nicotine or tobacco, such as cigarettes and e-cigarettes. If you need help quitting, ask your health care provider.  Limit alcohol intake to no more than one drink per day for nonpregnant women and no more than  two drinks per day for men. One drink equals 12 oz of beer, 5 oz of wine, or 1 oz of hard liquor.  Try to get at least 7 hours of sleep each night.  Find healthy ways to manage stress. General instructions  Take over-the-counter and prescription medicines only as told by your health care provider. Some medicines can be dangerous for your heart.  Tell all health care providers, including your dentist, that you have cardiomyopathy. When you visit the dentist or have surgery, ask your health care provider if you need antibiotics before having dental care or before the  surgery.  Ask your health care provider if you should wear a medical identification bracelet. This may be important if you have a pacemaker or a defibrillator.  Make sure you get all recommended vaccinations and an annual flu shot.  Work closely with your health care provider to manage any long-lasting (chronic) conditions.  Keep all follow-up visits as told by your health care provider. This is important, even if you do not have any symptoms. Your health care provider may need to make sure your condition is not getting worse. How is this prevented? This condition cannot be prevented. Parents, siblings, and children of people with this condition may be at risk for the condition. It is a good idea for them to get screened for the condition because it is best when cardiomyopathy is found early. Screening is done with an ECG and echocardiogram. People who want to start a family may also want to meet with a genetic counselor to discuss the risk of having a child with cardiomyopathy. Contact a health care provider if:  Your symptoms get worse.  You have new symptoms. Get help right away if:  You have severe chest pain.  You have shortness of breath.  You cough up pink, bubbly material.  You have sudden sweating.  You feel nauseous and you vomit.  You suddenly become light-headed or dizzy.  You feel your heart beating very quickly.  It feels like your heart is skipping beats. These symptoms may represent a serious problem that is an emergency. Do not wait to see if the symptoms will go away. Get medical help right away. Call your local emergency services (911 in the [place] Do not drive yourself to the hospital. This information is not intended to replace advice given to you by your health care provider. Make sure you discuss any questions you have with your health care provider. Document Released: 01/09/2005 Document Revised: 06/24/2016 Document Reviewed: 04/28/2016 Elsevier  Interactive Patient Education  [employer].

## 2018-03-17 NOTE — Progress Notes (Signed)
PROGRESS NOTE   Savannah Pena  QMV:[id number]  DOB: [birth year]  DOA: 03/15/2018 PCP: Benita Stabile, MD   Brief Admission Hx: 57 y.o. female, with history of seizure disorder, COPD, ADHD diabetes mellitus, was brought to hospital after patient had a witnessed tonic-clonic seizure at home.  No family at bedside, history obtained from the patient and ED notes.  Patient was sitting on a barstool when she began shaking in her left arm which progressed to entire body.  Patient was lowered to the ground but her significant other but did not lose consciousness or hit her head.  Seizure activity stopped by the time EMS arrived approximately 10 minutes later.  Patient's last seizure was 25 years ago.  MDM/Assessment & Plan:   1. Seizure-secondary to subtherapeutic Dilantin level.  This is her first seizure in 25 years but she had not been taking her medications.  Dilantin loading dose have been given and the pharmacy is managing the dilantin dosing. She has not had recurrent seizure activity.  Continue Dilantin 200 mg p.o. BID.  Neurology consulted. Pt has been seen by care manager and gotten assistance with obtaining her medications.  2. Elevated troponin-patient has elevated troponin which has peaked at 1.64.  There is concern for NSTEMI.  Patient has been on heparin protocol and cardiology has seen patient and planning for cath.   Echocardiogram was markedly abnormal with EF 35-40% with some regional wall abnormalities. 3. Diabetes mellitus type 2 -we will initiate sliding scale insulin with NovoLog. 4. Hypertension-well controlled.  5. Depression-patient t has been on Wellbutrin, Cymbalta at home we will continue with Cymbalta,  patient stopped taking Wellbutrin  Echocardiogram Study Conclusions  - Left ventricle: The cavity size was normal. Wall thickness was  increased in a pattern of mild LVH. Systolic function was   moderately reduced. The estimated ejection fraction was in the  range of 35% to  40%. - Regional wall motion abnormality: Hypokinesis of the mid-apical  anterior, mid anteroseptal, mid inferolateral, mid anterolateral,   apical septal, apical lateral, and apical myocardium. Diastolic  function is abnormal, indeterminant grade. - Aortic valve: Valve area (VTI): 3.08 cm^2. Valve area (Vmax):  2.81 cm^2. - Atrial septum: No defect or patent foramen ovale was identified.  DVT Prophylaxis-   Heparin  AM Labs Ordered, also please review Full Orders  Family Communication: Admission, patients condition and plan of care including tests being ordered have been discussed with the patient  who indicate understanding and agree with the plan and Code Status.  Consultants:  Cardiology   Neurology  Subjective: Pt says that she has some SOB at times.    Objective: Vitals:   03/17/18 0200 03/17/18 0300 03/17/18 0400 03/17/18 0500  BP: (!) 102/59 (!) 93/59 (!) 84/61 104/70  Pulse: 78 76 75 71  Resp:      Temp:   98.8 F (37.1 C)   TempSrc:   Oral   SpO2: 95% 92% 92% 95%  Weight:    109.1 kg (240 lb 8.4 oz)  Height:        Intake/Output Summary (Last 24 hours) at 03/17/2018 0652 Last data filed at 03/16/2018 2000 Gross per 24 hour  Intake 161.58 ml  Output 650 ml  Net -488.42 ml   Filed Weights   03/15/18 2109 03/16/18 0634 03/17/18 0500  Weight: 108.9 kg (240 lb) 107.9 kg (237 lb 14 oz) 109.1 kg (240 lb 8.4 oz)    REVIEW OF SYSTEMS  As per history otherwise all  reviewed and reported negative  Exam:  General exam: awake, alert, NAD. Cooperative.  Respiratory system: Clear. No increased work of breathing. Cardiovascular system: normal S1 & S2 heard.  Gastrointestinal system: Abdomen is nondistended, soft and nontender. Normal bowel sounds heard. Central nervous system: Alert and oriented. No focal neurological deficits. Extremities: no CCE.  Data Reviewed: Basic Metabolic Panel: Recent Labs  Lab 03/15/18 2140 03/17/18 0458  NA 140 141  K 3.9 4.0  CL  104 106  CO2 25 26  GLUCOSE 189* 136*  BUN 14 17  CREATININE 0.69 0.64  CALCIUM 9.5 8.8*   Liver Function Tests: Recent Labs  Lab 03/15/18 2140 03/17/18 0458  AST 19 24  ALT 30 29  ALKPHOS 91 77  BILITOT 0.5 0.6  PROT 7.5 6.4*  ALBUMIN 4.1 3.7   No results for input(s): LIPASE, AMYLASE in the last 168 hours. No results for input(s): AMMONIA in the last 168 hours. CBC: Recent Labs  Lab 03/15/18 2140 03/17/18 0458  WBC 18.8* 10.5  NEUTROABS 14.8*  --   HGB 14.5 12.7  HCT 41.7 39.0  MCV 90.7 93.3  PLT 308 260   Cardiac Enzymes: Recent Labs  Lab 03/15/18 2140 03/16/18 0022 03/16/18 0651 03/16/18 1237 03/16/18 1727  CKTOTAL  --  270*  --   --   --   TROPONINI 0.19* 0.82* 1.36* 1.64* 1.47*   CBG (last 3)  Recent Labs    03/16/18 0714 03/16/18 1103 03/16/18 1621  GLUCAP 206* 217* 127*   Recent Results (from the past 240 hour(s))  MRSA PCR Screening     Status: None   Collection Time: 03/16/18  6:27 AM  Result Value Ref Range Status   MRSA by PCR NEGATIVE NEGATIVE Final    Comment:        The GeneXpert MRSA Assay (FDA approved for NASAL specimens only), is one component of a comprehensive MRSA colonization surveillance program. It is not intended to diagnose MRSA infection nor to guide or monitor treatment for MRSA infections. Performed at [facility], [address] [place], [place] [address]      Studies: Dg Chest 2 View  Result Date: 03/15/2018 CLINICAL DATA:  57 y/o F; seizure and shortness of breath today. History of asthma, COPD, diabetes, hypertension, and former smoker. EXAM: CHEST - 2 VIEW COMPARISON:  None. FINDINGS: Normal cardiac silhouette. Aortic atherosclerosis with calcification. Clear lungs. No pleural effusion or pneumothorax. No acute osseous abnormality is evident. IMPRESSION: No acute pulmonary process identified.  Aortic atherosclerosis. Electronically Signed   By: Mitzi Hansen M.D.   On: 03/15/2018 22:18   Dg Elbow  Complete Right  Result Date: 03/16/2018 CLINICAL DATA:  57 y/o F; seizures today with fall and elbow pain at the ulna. EXAM: RIGHT ELBOW - COMPLETE 3+ VIEW COMPARISON:  None. FINDINGS: There is no evidence of fracture, dislocation, or joint effusion. There is no evidence of arthropathy or other focal bone abnormality. Soft tissues are unremarkable. IMPRESSION: Negative. Electronically Signed   By: Mitzi Hansen M.D.   On: 03/16/2018 03:04   Ct Head Wo Contrast  Result Date: 03/15/2018 CLINICAL DATA:  57 year old female with altered mental status. EXAM: CT HEAD WITHOUT CONTRAST TECHNIQUE: Contiguous axial images were obtained from the base of the skull through the vertex without intravenous contrast. COMPARISON:  None. FINDINGS: Brain: No evidence of acute infarction, hemorrhage, hydrocephalus, extra-axial collection or mass lesion/mass effect. Vascular: No hyperdense vessel or unexpected calcification. Skull: Normal. Negative for fracture or focal lesion.  Sinuses/Orbits: Mild diffuse mucoperiosteal thickening of paranasal sinuses. No air-fluid levels. The mastoid air cells are clear. Other: None IMPRESSION: No acute intracranial pathology. Electronically Signed   By: Elgie Collard M.D.   On: 03/15/2018 22:29   Scheduled Meds: . aspirin EC  81 mg Oral Daily  . budesonide (PULMICORT) nebulizer solution  0.25 mg Nebulization BID  . DULoxetine  90 mg Oral Daily  . gabapentin  300 mg Oral BID  . insulin aspart  0-9 Units Subcutaneous TID WC  . lisinopril  20 mg Oral Daily  . loratadine  10 mg Oral Daily  . metoprolol succinate  12.5 mg Oral Daily  . montelukast  10 mg Oral Daily  . pantoprazole  40 mg Oral Daily  . phenytoin  200 mg Oral BID  . simvastatin  40 mg Oral Daily   Continuous Infusions: . sodium chloride 10 mL/hr at 03/16/18 1920  . heparin 1,500 Units/hr (03/16/18 2150)    Active Problems:   Asthma   Macular degeneration   MDD (major depressive disorder), recurrent  episode, moderate (HCC)   Seizure (HCC)   Elevated troponin   COPD (chronic obstructive pulmonary disease) (HCC)   Diabetes mellitus without complication (HCC)   Attention deficit disorder (ADD)  Critical Care Time spent: 32 mins  Standley Dakins, MD, FAAFP [facility] Pager [phone]  If 7PM-7AM, please contact night-coverage [email or web address] Password [email or web address] 03/17/2018, 6:52 AM    LOS: 1 day

## 2018-03-17 NOTE — Progress Notes (Signed)
ANTICOAGULATION CONSULT NOTE   Pharmacy Consult for heparin Indication: chest pain/ACS  Allergies  Allergen Reactions  . Fruit Extracts Nausea And Vomiting    Bananas   Patient Measurements: Height: 5\' 4"  (162.6 cm) Weight: 240 lb 8.4 oz (109.1 kg) IBW/kg (Calculated) : 54.7 HEPARIN DW (KG): 80.2  Vital Signs: Temp: 98.8 F (37.1 C) (05/08 0400) Temp Source: Oral (05/08 0400) BP: 104/70 (05/08 0500) Pulse Rate: 71 (05/08 0500)  Labs: Recent Labs    03/15/18 2140 03/16/18 0022 03/16/18 0205 03/16/18 0651  03/16/18 1237 03/16/18 1430 03/16/18 1727 03/16/18 2217 03/17/18 0458  HGB 14.5  --   --   --   --   --   --   --   --  12.7  HCT 41.7  --   --   --   --   --   --   --   --  39.0  PLT 308  --   --   --   --   --   --   --   --  260  APTT  --   --  86*  --   --   --   --   --   --   --   LABPROT  --   --  13.3  --   --   --   --   --   --   --   INR  --   --  1.02  --   --   --   --   --   --   --   HEPARINUNFRC  --   --   --   --    < >  --  0.13*  --  0.26* 0.23*  CREATININE 0.69  --   --   --   --   --   --   --   --  0.64  CKTOTAL  --  270*  --   --   --   --   --   --   --   --   TROPONINI 0.19* 0.82*  --  1.36*  --  1.64*  --  1.47*  --   --    < > = values in this interval not displayed.   Estimated Creatinine Clearance: 96 mL/min (by C-G formula based on SCr of 0.64 mg/dL).  Medical History: Past Medical History:  Diagnosis Date  . Anxiety   . Arthritis   . Asthma   . Attention deficit disorder (ADD)   . COPD (chronic obstructive pulmonary disease) (HCC)   . Diabetes mellitus without complication (HCC)   . Dyspnea    with exertion  . GERD (gastroesophageal reflux disease)   . History of kidney stones   . Hypertension   . Major depression   . Seizures (HCC)    last seizure 23 years ago  . Sleep apnea    has C-PAP, but does not currently use   Medications:  Scheduled:  . aspirin EC  81 mg Oral Daily  . budesonide (PULMICORT) nebulizer  solution  0.25 mg Nebulization BID  . DULoxetine  90 mg Oral Daily  . gabapentin  300 mg Oral BID  . heparin  2,000 Units Intravenous Once  . insulin aspart  0-9 Units Subcutaneous TID WC  . lisinopril  20 mg Oral Daily  . loratadine  10 mg Oral Daily  . metoprolol succinate  12.5 mg Oral Daily  . montelukast  10 mg Oral Daily  . pantoprazole  40 mg Oral Daily  . phenytoin  200 mg Oral BID  . simvastatin  40 mg Oral Daily   Infusions:  . sodium chloride 10 mL/hr at 03/16/18 1920  . heparin 1,500 Units/hr (03/16/18 2150)   PRN:  Anti-infectives (From admission, onward)   None     Assessment: 57 yo in ED for seizures, SOB.  Troponin elevated, starting heparin.  Heparin level is not at goal.   Goal of Therapy:  Heparin level 0.3-0.7 units/ml   Plan:  Heparin rebolus 2000 units now x 1 Increase Heparin infusion to 1800 units/hr Recheck heparin level in ~ 6 hrs CBC and Heparin level daily while on Heparin  Valrie Hart A, RPH 03/17/2018,8:08 AM

## 2018-03-17 NOTE — H&P (View-Only) (Signed)
 Progress Note  Patient Name: Savannah Pena Date of Encounter: 03/17/2018  Primary Cardiologist: new  Subjective   No complaints  Inpatient Medications    Scheduled Meds: . aspirin EC  81 mg Oral Daily  . budesonide (PULMICORT) nebulizer solution  0.25 mg Nebulization BID  . DULoxetine  90 mg Oral Daily  . gabapentin  300 mg Oral BID  . insulin aspart  0-9 Units Subcutaneous TID WC  . lisinopril  20 mg Oral Daily  . loratadine  10 mg Oral Daily  . metoprolol succinate  12.5 mg Oral Daily  . montelukast  10 mg Oral Daily  . pantoprazole  40 mg Oral Daily  . phenytoin  200 mg Oral BID  . simvastatin  40 mg Oral Daily   Continuous Infusions: . sodium chloride 10 mL/hr at 03/16/18 1920  . heparin 1,500 Units/hr (03/16/18 2150)   PRN Meds: acetaminophen **OR** acetaminophen, albuterol, ondansetron **OR** ondansetron (ZOFRAN) IV   Vital Signs    Vitals:   03/17/18 0300 03/17/18 0400 03/17/18 0500 03/17/18 0757  BP: (!) 93/59 (!) 84/61 104/70   Pulse: 76 75 71   Resp:      Temp:  98.8 F (37.1 C)    TempSrc:  Oral    SpO2: 92% 92% 95% 94%  Weight:   240 lb 8.4 oz (109.1 kg)   Height:        Intake/Output Summary (Last 24 hours) at 03/17/2018 0801 Last data filed at 03/16/2018 2000 Gross per 24 hour  Intake 161.58 ml  Output 650 ml  Net -488.42 ml   Filed Weights   03/15/18 2109 03/16/18 0634 03/17/18 0500  Weight: 240 lb (108.9 kg) 237 lb 14 oz (107.9 kg) 240 lb 8.4 oz (109.1 kg)    Telemetry    SR - Personally Reviewed  ECG    na  Physical Exam   GEN: No acute distress.   Neck: No JVD Cardiac: RRR, no murmurs, rubs, or gallops.  Respiratory: Clear to auscultation bilaterally. GI: Soft, nontender, non-distended  MS: No edema; No deformity. Neuro:  Nonfocal  Psych: Normal affect   Labs    Chemistry Recent Labs  Lab 03/15/18 2140 03/17/18 0458  NA 140 141  K 3.9 4.0  CL 104 106  CO2 25 26  GLUCOSE 189* 136*  BUN 14 17  CREATININE 0.69  0.64  CALCIUM 9.5 8.8*  PROT 7.5 6.4*  ALBUMIN 4.1 3.7  AST 19 24  ALT 30 29  ALKPHOS 91 77  BILITOT 0.5 0.6  GFRNONAA >60 >60  GFRAA >60 >60  ANIONGAP 11 9     Hematology Recent Labs  Lab 03/15/18 2140 03/17/18 0458  WBC 18.8* 10.5  RBC 4.60 4.18  HGB 14.5 12.7  HCT 41.7 39.0  MCV 90.7 93.3  MCH 31.5 30.4  MCHC 34.8 32.6  RDW 12.7 13.0  PLT 308 260    Cardiac Enzymes Recent Labs  Lab 03/16/18 0022 03/16/18 0651 03/16/18 1237 03/16/18 1727  TROPONINI 0.82* 1.36* 1.64* 1.47*   No results for input(s): TROPIPOC in the last 168 hours.   BNPNo results for input(s): BNP, PROBNP in the last 168 hours.   DDimer  Recent Labs  Lab 03/15/18 2250  DDIMER 0.49     Radiology    Dg Chest 2 View  Result Date: 03/15/2018 CLINICAL DATA:  56 y/o F; seizure and shortness of breath today. History of asthma, COPD, diabetes, hypertension, and former smoker. EXAM: CHEST - 2 VIEW   COMPARISON:  None. FINDINGS: Normal cardiac silhouette. Aortic atherosclerosis with calcification. Clear lungs. No pleural effusion or pneumothorax. No acute osseous abnormality is evident. IMPRESSION: No acute pulmonary process identified.  Aortic atherosclerosis. Electronically Signed   By: Lance  Furusawa-Stratton M.D.   On: 03/15/2018 22:18   Dg Elbow Complete Right  Result Date: 03/16/2018 CLINICAL DATA:  56 y/o F; seizures today with fall and elbow pain at the ulna. EXAM: RIGHT ELBOW - COMPLETE 3+ VIEW COMPARISON:  None. FINDINGS: There is no evidence of fracture, dislocation, or joint effusion. There is no evidence of arthropathy or other focal bone abnormality. Soft tissues are unremarkable. IMPRESSION: Negative. Electronically Signed   By: Lance  Furusawa-Stratton M.D.   On: 03/16/2018 03:04   Ct Head Wo Contrast  Result Date: 03/15/2018 CLINICAL DATA:  56-year-old female with altered mental status. EXAM: CT HEAD WITHOUT CONTRAST TECHNIQUE: Contiguous axial images were obtained from the base of the  skull through the vertex without intravenous contrast. COMPARISON:  None. FINDINGS: Brain: No evidence of acute infarction, hemorrhage, hydrocephalus, extra-axial collection or mass lesion/mass effect. Vascular: No hyperdense vessel or unexpected calcification. Skull: Normal. Negative for fracture or focal lesion. Sinuses/Orbits: Mild diffuse mucoperiosteal thickening of paranasal sinuses. No air-fluid levels. The mastoid air cells are clear. Other: None IMPRESSION: No acute intracranial pathology. Electronically Signed   By: Arash  Radparvar M.D.   On: 03/15/2018 22:29    Cardiac Studies    Patient Profile     Savannah Pena is a 56 y.o. female with a hx of DM, asthmatic COPD, obesity, and remote h/o seizures who is being seen today for the evaluation of an elevated Troponin at the request of Dr Johnson.    Assessment & Plan    1. Elevated troponin/Acute systolic dysfunction - presented with seizure, peak trop to 1.64 - echo with LVEF 35-40%, primarily apical WMAs. Possible stress induced CM/Takotsubo CM, however CAD will need to be exlcuded, particularly given her CAD risk factors - plan for cath  - contniue medical therapy with ASA, hep gtt, lisinopril 20, Toprol XL 12.5mg, simva 40 - lower lisinopril to 10mg daily due to soft bp's  2. Seizure - per neuro and primary team, subtherapeutic dilantin level   I will discuss with primary team if patient ok for cath today, if so would ask be transferred to medicine team at [facility] with cardiology following.   For questions or updates, please contact [facility] Please consult [email or web address] for contact info under Cardiology/STEMI.      Signed, Matthew Pais, MD  03/17/2018, 8:01 AM    

## 2018-03-17 NOTE — Progress Notes (Addendum)
Progress Note  Patient Name: Savannah Pena Date of Encounter: 03/17/2018  Primary Cardiologist: new  Subjective   No complaints  Inpatient Medications    Scheduled Meds: . aspirin EC  81 mg Oral Daily  . budesonide (PULMICORT) nebulizer solution  0.25 mg Nebulization BID  . DULoxetine  90 mg Oral Daily  . gabapentin  300 mg Oral BID  . insulin aspart  0-9 Units Subcutaneous TID WC  . lisinopril  20 mg Oral Daily  . loratadine  10 mg Oral Daily  . metoprolol succinate  12.5 mg Oral Daily  . montelukast  10 mg Oral Daily  . pantoprazole  40 mg Oral Daily  . phenytoin  200 mg Oral BID  . simvastatin  40 mg Oral Daily   Continuous Infusions: . sodium chloride 10 mL/hr at 03/16/18 1920  . heparin 1,500 Units/hr (03/16/18 2150)   PRN Meds: acetaminophen **OR** acetaminophen, albuterol, ondansetron **OR** ondansetron (ZOFRAN) IV   Vital Signs    Vitals:   03/17/18 0300 03/17/18 0400 03/17/18 0500 03/17/18 0757  BP: (!) 93/59 (!) 84/61 104/70   Pulse: 76 75 71   Resp:      Temp:  98.8 F (37.1 C)    TempSrc:  Oral    SpO2: 92% 92% 95% 94%  Weight:   240 lb 8.4 oz (109.1 kg)   Height:        Intake/Output Summary (Last 24 hours) at 03/17/2018 0801 Last data filed at 03/16/2018 2000 Gross per 24 hour  Intake 161.58 ml  Output 650 ml  Net -488.42 ml   Filed Weights   03/15/18 2109 03/16/18 0634 03/17/18 0500  Weight: 240 lb (108.9 kg) 237 lb 14 oz (107.9 kg) 240 lb 8.4 oz (109.1 kg)    Telemetry    SR - Personally Reviewed  ECG    na  Physical Exam   GEN: No acute distress.   Neck: No JVD Cardiac: RRR, no murmurs, rubs, or gallops.  Respiratory: Clear to auscultation bilaterally. GI: Soft, nontender, non-distended  MS: No edema; No deformity. Neuro:  Nonfocal  Psych: Normal affect   Labs    Chemistry Recent Labs  Lab 03/15/18 2140 03/17/18 0458  NA 140 141  K 3.9 4.0  CL 104 106  CO2 25 26  GLUCOSE 189* 136*  BUN 14 17  CREATININE 0.69  0.64  CALCIUM 9.5 8.8*  PROT 7.5 6.4*  ALBUMIN 4.1 3.7  AST 19 24  ALT 30 29  ALKPHOS 91 77  BILITOT 0.5 0.6  GFRNONAA >60 >60  GFRAA >60 >60  ANIONGAP 11 9     Hematology Recent Labs  Lab 03/15/18 2140 03/17/18 0458  WBC 18.8* 10.5  RBC 4.60 4.18  HGB 14.5 12.7  HCT 41.7 39.0  MCV 90.7 93.3  MCH 31.5 30.4  MCHC 34.8 32.6  RDW 12.7 13.0  PLT 308 260    Cardiac Enzymes Recent Labs  Lab 03/16/18 0022 03/16/18 0651 03/16/18 1237 03/16/18 1727  TROPONINI 0.82* 1.36* 1.64* 1.47*   No results for input(s): TROPIPOC in the last 168 hours.   BNPNo results for input(s): BNP, PROBNP in the last 168 hours.   DDimer  Recent Labs  Lab 03/15/18 2250  DDIMER 0.49     Radiology    Dg Chest 2 View  Result Date: 03/15/2018 CLINICAL DATA:  57 y/o F; seizure and shortness of breath today. History of asthma, COPD, diabetes, hypertension, and former smoker. EXAM: CHEST - 2 VIEW  COMPARISON:  None. FINDINGS: Normal cardiac silhouette. Aortic atherosclerosis with calcification. Clear lungs. No pleural effusion or pneumothorax. No acute osseous abnormality is evident. IMPRESSION: No acute pulmonary process identified.  Aortic atherosclerosis. Electronically Signed   By: Mitzi Hansen M.D.   On: 03/15/2018 22:18   Dg Elbow Complete Right  Result Date: 03/16/2018 CLINICAL DATA:  57 y/o F; seizures today with fall and elbow pain at the ulna. EXAM: RIGHT ELBOW - COMPLETE 3+ VIEW COMPARISON:  None. FINDINGS: There is no evidence of fracture, dislocation, or joint effusion. There is no evidence of arthropathy or other focal bone abnormality. Soft tissues are unremarkable. IMPRESSION: Negative. Electronically Signed   By: Mitzi Hansen M.D.   On: 03/16/2018 03:04   Ct Head Wo Contrast  Result Date: 03/15/2018 CLINICAL DATA:  57 year old female with altered mental status. EXAM: CT HEAD WITHOUT CONTRAST TECHNIQUE: Contiguous axial images were obtained from the base of the  skull through the vertex without intravenous contrast. COMPARISON:  None. FINDINGS: Brain: No evidence of acute infarction, hemorrhage, hydrocephalus, extra-axial collection or mass lesion/mass effect. Vascular: No hyperdense vessel or unexpected calcification. Skull: Normal. Negative for fracture or focal lesion. Sinuses/Orbits: Mild diffuse mucoperiosteal thickening of paranasal sinuses. No air-fluid levels. The mastoid air cells are clear. Other: None IMPRESSION: No acute intracranial pathology. Electronically Signed   By: Elgie Collard M.D.   On: 03/15/2018 22:29    Cardiac Studies    Patient Profile     Savannah Pena is a 57 y.o. female with a hx of DM, asthmatic COPD, obesity, and remote h/o seizures who is being seen today for the evaluation of an elevated Troponin at the request of Dr Laural Benes.    Assessment & Plan    1. Elevated troponin/Acute systolic dysfunction - presented with seizure, peak trop to 1.64 - echo with LVEF 35-40%, primarily apical WMAs. Possible stress induced CM/Takotsubo CM, however CAD will need to be exlcuded, particularly given her CAD risk factors - plan for cath  - contniue medical therapy with ASA, hep gtt, lisinopril 20, Toprol XL 12.5mg , simva 40 - lower lisinopril to 10mg  daily due to soft bp's  2. Seizure - per neuro and primary team, subtherapeutic dilantin level   I will discuss with primary team if patient ok for cath today, if so would ask be transferred to medicine team at [facility] with cardiology following.   For questions or updates, please contact [facility] Please consult [email or web address] for contact info under Cardiology/STEMI.      Joanie Coddington, MD  03/17/2018, 8:01 AM

## 2018-03-17 NOTE — Interval H&P Note (Signed)
History and Physical Interval Note:  03/17/2018 3:37 PM  Savannah Pena  has presented today for cardiac cath with the diagnosis of elevated troponin. The various methods of treatment have been discussed with the patient and family. After consideration of risks, benefits and other options for treatment, the patient has consented to  Procedure(s): LEFT HEART CATH AND CORONARY ANGIOGRAPHY (N/A) as a surgical intervention .  The patient's history has been reviewed, patient examined, no change in status, stable for surgery.  I have reviewed the patient's chart and labs.  Questions were answered to the patient's satisfaction.    Cath Lab Visit (complete for each Cath Lab visit)  Clinical Evaluation Leading to the Procedure:   ACS: Yes.    Non-ACS:    Anginal Classification: CCS I  Anti-ischemic medical therapy: No Therapy  Non-Invasive Test Results: No non-invasive testing performed  Prior CABG: No previous CABG        Verne Carrow

## 2018-03-17 NOTE — Progress Notes (Signed)
TR BAND REMOVAL  LOCATION:    right radial  DEFLATED PER PROTOCOL:    Yes.    TIME BAND OFF / DRESSING APPLIED:    1815   SITE UPON ARRIVAL:    Level 0  SITE AFTER BAND REMOVAL:    Level 0  CIRCULATION SENSATION AND MOVEMENT:    Within Normal Limits   Yes.    COMMENTS:   Rechecked at 1845 with no change in assessment.  

## 2018-03-17 NOTE — Clinical Social Work Note (Signed)
Patient referred to CSW for needing a PCP and medication assistance. CM is aware and has addressed the issue.  LCSW signing off. Please submit new CSW consult if the need arises.    Kateland Leisinger, Juleen China, LCSW

## 2018-03-18 ENCOUNTER — Encounter (HOSPITAL_COMMUNITY): Payer: Self-pay | Admitting: Cardiovascular Disease

## 2018-03-18 DIAGNOSIS — R569 Unspecified convulsions: Secondary | ICD-10-CM

## 2018-03-18 DIAGNOSIS — R748 Abnormal levels of other serum enzymes: Secondary | ICD-10-CM

## 2018-03-18 DIAGNOSIS — E7800 Pure hypercholesterolemia, unspecified: Secondary | ICD-10-CM

## 2018-03-18 DIAGNOSIS — I5181 Takotsubo syndrome: Principal | ICD-10-CM

## 2018-03-18 LAB — CBC
HCT: 38.2 % (ref 36.0–46.0)
Hemoglobin: 12.5 g/dL (ref 12.0–15.0)
MCH: 30.3 pg (ref 26.0–34.0)
MCHC: 32.7 g/dL (ref 30.0–36.0)
MCV: 92.5 fL (ref 78.0–100.0)
PLATELETS: 249 10*3/uL (ref 150–400)
RBC: 4.13 MIL/uL (ref 3.87–5.11)
RDW: 12.9 % (ref 11.5–15.5)
WBC: 9.9 10*3/uL (ref 4.0–10.5)

## 2018-03-18 LAB — BASIC METABOLIC PANEL
Anion gap: 9 (ref 5–15)
BUN: 12 mg/dL (ref 6–20)
CALCIUM: 8.9 mg/dL (ref 8.9–10.3)
CO2: 26 mmol/L (ref 22–32)
CREATININE: 0.66 mg/dL (ref 0.44–1.00)
Chloride: 105 mmol/L (ref 101–111)
GFR calc Af Amer: >60 mL/min (ref >60)
GLUCOSE: 133 mg/dL — AB (ref 65–99)
POTASSIUM: 4 mmol/L (ref 3.5–5.1)
SODIUM: 140 mmol/L (ref 135–145)

## 2018-03-18 LAB — GLUCOSE, CAPILLARY: Glucose-Capillary: 134 mg/dL — ABNORMAL HIGH (ref 65–99)

## 2018-03-18 MED ORDER — DULOXETINE HCL 30 MG PO CPEP: 90.0000 mg | ORAL_CAPSULE | Freq: Every day | ORAL | 0 refills | Status: DC

## 2018-03-18 MED ORDER — LORATADINE 10 MG PO TABS: 10.0000 mg | ORAL_TABLET | Freq: Every day | ORAL | Status: DC

## 2018-03-18 MED ORDER — ROSUVASTATIN CALCIUM 10 MG PO TABS: 10.0000 mg | ORAL_TABLET | Freq: Every day | ORAL | 11 refills | Status: DC

## 2018-03-18 MED ORDER — ATORVASTATIN CALCIUM 40 MG PO TABS: 40.0000 mg | ORAL_TABLET | Freq: Every day | ORAL | Status: DC

## 2018-03-18 MED ORDER — METOPROLOL SUCCINATE ER 25 MG PO TB24: 12.5000 mg | ORAL_TABLET | Freq: Every day | ORAL | 0 refills | Status: DC

## 2018-03-18 MED ORDER — PHENYTOIN SODIUM EXTENDED 200 MG PO CAPS: 200.0000 mg | ORAL_CAPSULE | Freq: Two times a day (BID) | ORAL | 0 refills | Status: AC

## 2018-03-18 MED ORDER — METFORMIN HCL 500 MG PO TABS: 500.0000 mg | ORAL_TABLET | Freq: Two times a day (BID) | ORAL | 0 refills | Status: DC

## 2018-03-18 MED ORDER — ATORVASTATIN CALCIUM 40 MG PO TABS: 40.0000 mg | ORAL_TABLET | Freq: Every day | ORAL | 0 refills | Status: DC

## 2018-03-18 MED ORDER — LISINOPRIL 10 MG PO TABS: 10.0000 mg | ORAL_TABLET | Freq: Every day | ORAL | 0 refills | Status: DC

## 2018-03-18 MED ORDER — ASPIRIN 81 MG PO TBEC: 81.0000 mg | DELAYED_RELEASE_TABLET | Freq: Every day | ORAL | Status: AC

## 2018-03-18 MED FILL — Heparin Sod (Porcine)-NaCl IV Soln 1000 Unit/500ML-0.9%: INTRAVENOUS | Qty: 1000 | Status: AC

## 2018-03-18 NOTE — Discharge Summary (Addendum)
Physician Discharge Summary  Savannah Pena VWU:[id number] DOB: 02/06/1961 DOA: 03/15/2018  PCP: Benita Stabile, MD  Admit date: 03/15/2018 Discharge date: 03/18/2018  Admitted From: home Disposition:  home Recommendations for Outpatient Follow-up:  1. Follow up with PCP in 1-2 weeks 2. Please obtain BMP/CBC/Dilantin level in one week  Home Health: none Equipment/Devices:none  Discharge Condition:stable CODE STATUSfull Diet recommendation:cardiac Brief/Interim Summary: 57 y.o.female,with history of seizure disorder, COPD, ADHD diabetes mellitus,was brought to hospital after patient had a witnessed tonic-clonic seizure at home. No family at bedside, history obtained from the patient and ED notes. Patient was sitting on a barstool when she began shaking in her left arm which progressed to entire body. Patient was lowered to the ground but her significant other but did not lose consciousness or hit her head. Seizure activity stopped by the time EMS arrived approximately 10 minutes later. Patient's last seizure was 25 years ago.   Discharge Diagnoses:  Active Problems:   Asthma   Macular degeneration   MDD (major depressive disorder), recurrent episode, moderate (HCC)   Seizure (HCC)   Elevated troponin   COPD (chronic obstructive pulmonary disease) (HCC)   Diabetes mellitus without complication (HCC)   Attention deficit disorder (ADD)   Cardiomyopathy, unspecified (HCC)   NSTEMI (non-ST elevated myocardial infarction) (HCC)   NICM (nonischemic cardiomyopathy) (HCC)   Takotsubo cardiomyopathy   Pure hypercholesterolemia  - seizure-patient has a history of seizure but not has had a seizure for 25 years.  And so she stopped taking her medications.  She was seen in consult by neurology recommended putting her back on her Dilantin 200 mg twice daily and recheck level in couple of days. takatsubo cmp-  Elevated troponin with abnormal echo, cardiac cath showed mild nonobstructive CAD,  moderate left ventricular systolic dysfunction with segmental wall motion of normality including the apex anteroapical and inferoapical walls.  Cardiology recommends medical management of mild CAD.  Type 2 diabetes restart metformin after 2 days.  Depression continue Cymbalta.  hypertension stable.     Discharge Instructions  Discharge Instructions    Call MD for:  difficulty breathing, headache or visual disturbances   Complete by:  As directed    Call MD for:  persistant nausea and vomiting   Complete by:  As directed    Call MD for:  severe uncontrolled pain   Complete by:  As directed    Diet - low sodium heart healthy   Complete by:  As directed    Increase activity slowly   Complete by:  As directed      Allergies as of 03/18/2018      Reactions   Fruit Extracts Nausea And Vomiting   Bananas      Medication List    STOP taking these medications   albuterol (2.5 MG/3ML) 0.083% nebulizer solution Commonly known as:  PROVENTIL   albuterol 108 (90 Base) MCG/ACT inhaler Commonly known as:  PROVENTIL HFA;VENTOLIN HFA   BENADRYL 25 MG tablet Generic drug:  diphenhydrAMINE   buPROPion 150 MG 24 hr tablet Commonly known as:  WELLBUTRIN XL   cetirizine 10 MG tablet Commonly known as:  ZYRTEC Replaced by:  loratadine 10 MG tablet   diclofenac 75 MG EC tablet Commonly known as:  VOLTAREN   gabapentin 300 MG capsule Commonly known as:  NEURONTIN   potassium citrate 10 MEQ (1080 MG) SR tablet Commonly known as:  UROCIT-K     TAKE these medications   aspirin 81 MG EC tablet Take  1 tablet (81 mg total) by mouth daily.   atorvastatin 40 MG tablet Commonly known as:  LIPITOR Take 1 tablet (40 mg total) by mouth daily at 6 PM.   DULoxetine 30 MG capsule Commonly known as:  CYMBALTA Take 3 capsules (90 mg total) by mouth daily.   fluticasone 50 MCG/BLIST diskus inhaler Commonly known as:  FLOVENT DISKUS Inhale 1 puff into the lungs 2 (two) times daily.    lisinopril 10 MG tablet Commonly known as:  PRINIVIL,ZESTRIL Take 1 tablet (10 mg total) by mouth daily. What changed:    medication strength  how much to take   loratadine 10 MG tablet Commonly known as:  CLARITIN Take 1 tablet (10 mg total) by mouth daily. Replaces:  cetirizine 10 MG tablet   metFORMIN 500 MG tablet Commonly known as:  GLUCOPHAGE Take 1 tablet (500 mg total) by mouth 2 (two) times daily with a meal. Start 03/20/2018 What changed:  additional instructions   metoprolol succinate 25 MG 24 hr tablet Commonly known as:  TOPROL-XL Take 0.5 tablets (12.5 mg total) by mouth daily.   montelukast 10 MG tablet Commonly known as:  SINGULAIR Take 10 mg by mouth daily.   omeprazole 40 MG capsule Commonly known as:  PRILOSEC Take 40 mg by mouth daily.   oxybutynin 5 MG tablet Commonly known as:  DITROPAN Take 1 tablet (5 mg total) by mouth every 8 (eight) hours as needed for bladder spasms. And stent discomfort.   phenytoin 200 MG ER capsule Commonly known as:  DILANTIN Take 1 capsule (200 mg total) by mouth 2 (two) times daily. What changed:    medication strength  how much to take  when to take this   simvastatin 40 MG tablet Commonly known as:  ZOCOR Take 40 mg by mouth daily.   STIOLTO RESPIMAT 2.5-2.5 MCG/ACT Aers Generic drug:  Tiotropium Bromide-Olodaterol Inhale into the lungs.   Vitamin D (Ergocalciferol) 50000 units Caps capsule Commonly known as:  DRISDOL Take 50,000 Units by mouth every 7 (seven) days.      Follow-up Information    Benita Stabile, MD Follow up.   Specialty:  Internal Medicine Why:  APPT scheduled for May 15th at 3 pm.  If you need to reschedule or cancel please do so with at least 24 hours of notice.  Contact information: [address] [name] [address] [phone]        Beryle Beams, MD. Schedule an appointment as soon as possible for a visit in 4 week(s).   Specialty:  Neurology Why:  Hospital  Follow Up  Contact information: [address] [place] [address] [phone]        Antoine Poche, MD Follow up.   Specialty:  Cardiology Why:  Dr. Verna Czech office will call you with a hospital follow-up visit  Contact information: [address] [place] [address] [phone]          Allergies  Allergen Reactions  . Fruit Extracts Nausea And Vomiting    Bananas    Consultations:  [facility]   Procedures/Studies: Dg Chest 2 View  Result Date: 03/15/2018 CLINICAL DATA:  57 y/o F; seizure and shortness of breath today. History of asthma, COPD, diabetes, hypertension, and former smoker. EXAM: CHEST - 2 VIEW COMPARISON:  None. FINDINGS: Normal cardiac silhouette. Aortic atherosclerosis with calcification. Clear lungs. No pleural effusion or pneumothorax. No acute osseous abnormality is evident. IMPRESSION: No acute pulmonary process identified.  Aortic atherosclerosis.  Electronically Signed   By: Mitzi Hansen M.D.   On: 03/15/2018 22:18   Dg Elbow Complete Right  Result Date: 03/16/2018 CLINICAL DATA:  57 y/o F; seizures today with fall and elbow pain at the ulna. EXAM: RIGHT ELBOW - COMPLETE 3+ VIEW COMPARISON:  None. FINDINGS: There is no evidence of fracture, dislocation, or joint effusion. There is no evidence of arthropathy or other focal bone abnormality. Soft tissues are unremarkable. IMPRESSION: Negative. Electronically Signed   By: Mitzi Hansen M.D.   On: 03/16/2018 03:04   Ct Head Wo Contrast  Result Date: 03/15/2018 CLINICAL DATA:  57 year old female with altered mental status. EXAM: CT HEAD WITHOUT CONTRAST TECHNIQUE: Contiguous axial images were obtained from the base of the skull through the vertex without intravenous contrast. COMPARISON:  None. FINDINGS: Brain: No evidence of acute infarction, hemorrhage, hydrocephalus, extra-axial collection or mass lesion/mass effect. Vascular: No hyperdense vessel or unexpected  calcification. Skull: Normal. Negative for fracture or focal lesion. Sinuses/Orbits: Mild diffuse mucoperiosteal thickening of paranasal sinuses. No air-fluid levels. The mastoid air cells are clear. Other: None IMPRESSION: No acute intracranial pathology. Electronically Signed   By: Elgie Collard M.D.   On: 03/15/2018 22:29    (Echo, Carotid, EGD, Colonoscopy, ERCP)    Subjective:   Discharge Exam: Vitals:   03/18/18 0755 03/18/18 0812  BP:  104/71  Pulse:  82  Resp:  11  Temp:  98.5 F (36.9 C)  SpO2: 95% 96%   Vitals:   03/18/18 0655 03/18/18 0750 03/18/18 0755 03/18/18 0812  BP: 123/72   104/71  Pulse: 87   82  Resp: 16   11  Temp:    98.5 F (36.9 C)  TempSrc:    Oral  SpO2: 94% 94% 95% 96%  Weight:      Height:        General: Pt is alert, awake, not in acute distress Cardiovascular: RRR, S1/S2 +, no rubs, no gallops Respiratory: CTA bilaterally, no wheezing, no rhonchi Abdominal: Soft, NT, ND, bowel sounds + Extremities: no edema, no cyanosis    The results of significant diagnostics from this hospitalization (including imaging, microbiology, ancillary and laboratory) are listed below for reference.     Microbiology: Recent Results (from the past 240 hour(s))  MRSA PCR Screening     Status: None   Collection Time: 03/16/18  6:27 AM  Result Value Ref Range Status   MRSA by PCR NEGATIVE NEGATIVE Final    Comment:        The GeneXpert MRSA Assay (FDA approved for NASAL specimens only), is one component of a comprehensive MRSA colonization surveillance program. It is not intended to diagnose MRSA infection nor to guide or monitor treatment for MRSA infections. Performed at [facility], [address], [place], [place] [address]      Labs: BNP (last 3 results) No results for input(s): BNP in the last 8760 hours. Basic Metabolic Panel: Recent Labs  Lab 03/15/18 2140 03/17/18 0458 03/18/18 0257  NA 140 141 140  K 3.9 4.0 4.0  CL 104 106 105   CO2 25 26 26   GLUCOSE 189* 136* 133*  BUN 14 17 12   CREATININE 0.69 0.64 0.66  CALCIUM 9.5 8.8* 8.9   Liver Function Tests: Recent Labs  Lab 03/15/18 2140 03/17/18 0458  AST 19 24  ALT 30 29  ALKPHOS 91 77  BILITOT 0.5 0.6  PROT 7.5 6.4*  ALBUMIN 4.1 3.7   No results for input(s): LIPASE, AMYLASE in  the last 168 hours. No results for input(s): AMMONIA in the last 168 hours. CBC: Recent Labs  Lab 03/15/18 2140 03/17/18 0458 03/18/18 0257  WBC 18.8* 10.5 9.9  NEUTROABS 14.8*  --   --   HGB 14.5 12.7 12.5  HCT 41.7 39.0 38.2  MCV 90.7 93.3 92.5  PLT 308 260 249   Cardiac Enzymes: Recent Labs  Lab 03/15/18 2140 03/16/18 0022 03/16/18 0651 03/16/18 1237 03/16/18 1727  CKTOTAL  --  270*  --   --   --   TROPONINI 0.19* 0.82* 1.36* 1.64* 1.47*   BNP: Invalid input(s): POCBNP CBG: Recent Labs  Lab 03/17/18 0833 03/17/18 1128 03/17/18 1753 03/17/18 2121 03/18/18 0626  GLUCAP 135* 132* 143* 149* 134*   D-Dimer Recent Labs    03/15/18 2250  DDIMER 0.49   Hgb A1c Recent Labs    03/16/18 0033  HGBA1C 6.6*   Lipid Profile Recent Labs    03/17/18 0458  CHOL 199  HDL 48  LDLCALC 89  TRIG 309*  CHOLHDL 4.1   Thyroid function studies No results for input(s): TSH, T4TOTAL, T3FREE, THYROIDAB in the last 72 hours.  Invalid input(s): FREET3 Anemia work up No results for input(s): VITAMINB12, FOLATE, FERRITIN, TIBC, IRON, RETICCTPCT in the last 72 hours. Urinalysis    Component Value Date/Time   COLORURINE YELLOW 03/16/2018 0019   APPEARANCEUR HAZY (A) 03/16/2018 0019   LABSPEC 1.016 03/16/2018 0019   PHURINE 5.0 03/16/2018 0019   GLUCOSEU NEGATIVE 03/16/2018 0019   HGBUR NEGATIVE 03/16/2018 0019   BILIRUBINUR NEGATIVE 03/16/2018 0019   KETONESUR NEGATIVE 03/16/2018 0019   PROTEINUR NEGATIVE 03/16/2018 0019   NITRITE NEGATIVE 03/16/2018 0019   LEUKOCYTESUR NEGATIVE 03/16/2018 0019   Sepsis Labs Invalid input(s): PROCALCITONIN,  WBC,   LACTICIDVEN Microbiology Recent Results (from the past 240 hour(s))  MRSA PCR Screening     Status: None   Collection Time: 03/16/18  6:27 AM  Result Value Ref Range Status   MRSA by PCR NEGATIVE NEGATIVE Final    Comment:        The GeneXpert MRSA Assay (FDA approved for NASAL specimens only), is one component of a comprehensive MRSA colonization surveillance program. It is not intended to diagnose MRSA infection nor to guide or monitor treatment for MRSA infections. Performed at [facility], [address], [place], [place] [address]      Time coordinating discharge: 33 minutes  SIGNED:   Alwyn Ren, MD  [facility] 03/18/2018, 11:07 AM Pager   If 7PM-7AM, please contact night-coverage [email or web address] Password [email or web address]

## 2018-03-18 NOTE — Progress Notes (Signed)
Progress Note  Patient Name: Savannah Pena Date of Encounter: 03/18/2018  Primary Cardiologist: Dr. Wyline Mood   Subjective   No chest pain. No dyspnea. Just feels tired all the time. Radial cath site is stable.   Inpatient Medications    Scheduled Meds: . aspirin EC  81 mg Oral Daily  . budesonide (PULMICORT) nebulizer solution  0.25 mg Nebulization BID  . DULoxetine  90 mg Oral Daily  . gabapentin  300 mg Oral BID  . insulin aspart  0-9 Units Subcutaneous TID WC  . lisinopril  10 mg Oral Daily  . loratadine  10 mg Oral Daily  . metoprolol succinate  12.5 mg Oral Daily  . montelukast  10 mg Oral Daily  . pantoprazole  40 mg Oral Daily  . phenytoin  200 mg Oral BID  . simvastatin  40 mg Oral Daily  . sodium chloride flush  3 mL Intravenous Q12H   Continuous Infusions: . sodium chloride Stopped (03/17/18 2200)  . sodium chloride     PRN Meds: sodium chloride, acetaminophen **OR** acetaminophen, albuterol, ondansetron **OR** ondansetron (ZOFRAN) IV, sodium chloride flush   Vital Signs    Vitals:   03/18/18 0259 03/18/18 0300 03/18/18 0655 03/18/18 0750  BP: (!) 101/49 (!) 101/49 123/72   Pulse: 76 76 87   Resp: 11 20 16    Temp: 97.7 F (36.5 C)     TempSrc: Oral     SpO2: 95% 94% 94% 94%  Weight: 241 lb 13.5 oz (109.7 kg)     Height:        Intake/Output Summary (Last 24 hours) at 03/18/2018 0753 Last data filed at 03/17/2018 2244 Gross per 24 hour  Intake 1437.5 ml  Output -  Net 1437.5 ml   Filed Weights   03/16/18 0634 03/17/18 0500 03/18/18 0259  Weight: 237 lb 14 oz (107.9 kg) 240 lb 8.4 oz (109.1 kg) 241 lb 13.5 oz (109.7 kg)    Telemetry    NSR - Personally Reviewed  ECG    NSR, LAD - Personally Reviewed  Physical Exam   GEN: No acute distress.  Obese  Neck: No JVD Cardiac: RRR, no murmurs, rubs, or gallops.  Respiratory: Clear to auscultation bilaterally. GI: Soft, nontender, non-distended  MS: No edema; No deformity. Neuro:  Nonfocal    Psych: Normal affect   Labs    Chemistry Recent Labs  Lab 03/15/18 2140 03/17/18 0458 03/18/18 0257  NA 140 141 140  K 3.9 4.0 4.0  CL 104 106 105  CO2 25 26 26   GLUCOSE 189* 136* 133*  BUN 14 17 12   CREATININE 0.69 0.64 0.66  CALCIUM 9.5 8.8* 8.9  PROT 7.5 6.4*  --   ALBUMIN 4.1 3.7  --   AST 19 24  --   ALT 30 29  --   ALKPHOS 91 77  --   BILITOT 0.5 0.6  --   GFRNONAA >60 >60 >60  GFRAA >60 >60 >60  ANIONGAP 11 9 9      Hematology Recent Labs  Lab 03/15/18 2140 03/17/18 0458 03/18/18 0257  WBC 18.8* 10.5 9.9  RBC 4.60 4.18 4.13  HGB 14.5 12.7 12.5  HCT 41.7 39.0 38.2  MCV 90.7 93.3 92.5  MCH 31.5 30.4 30.3  MCHC 34.8 32.6 32.7  RDW 12.7 13.0 12.9  PLT 308 260 249    Cardiac Enzymes Recent Labs  Lab 03/16/18 0022 03/16/18 0651 03/16/18 1237 03/16/18 1727  TROPONINI 0.82* 1.36* 1.64* 1.47*  No results for input(s): TROPIPOC in the last 168 hours.   BNPNo results for input(s): BNP, PROBNP in the last 168 hours.   DDimer  Recent Labs  Lab 03/15/18 2250  DDIMER 0.49     Radiology    No results found.  Cardiac Studies   [facility] 03/17/18 Procedures   LEFT HEART CATH AND CORONARY ANGIOGRAPHY  Conclusion     Prox RCA to Mid RCA lesion is 10% stenosed.  Mid Cx lesion is 20% stenosed.  Prox LAD to Mid LAD lesion is 40% stenosed.  There is mild to moderate left ventricular systolic dysfunction.  LV end diastolic pressure is normal.  The left ventricular ejection fraction is 35-45% by visual estimate.  There is no mitral valve regurgitation.   1. Mild non-obstructive CAD 2. Moderate LV systolic dysfunction with segmental wall motion abnormality involving the apex, anteroapical and inferoapical walls. This fits a pattern for a stress induced cardiomyopathy. (Takotsubo Cardiomyopathy).   Recommendations: Medical management of mild CAD. Medical management of her non-ischemic cardiomyopathy.       Patient Profile     Savannah Pena a 57 y.o.femalewith a hx of DM, asthmatic COPD, obesity, admitted for seizures. Echo showed decreased LVEF, 35-40% with multiple primarily apical wall motion abnormalities. Pt subsequently referred for definitive cath. Cath showed mild nonobstructive CAD. Suspect stress induced CM.    Assessment & Plan    1. Stress Induced Cardiomyopathy (Tackotsubo Cardiomyopathy): EF 35-40%. Troponin peaked at 1.64. Cath showed mild nonobstructive CAD and moderate LV systolic dysfunction with segmental wall motion abnormality involving the apex, anteroapical and inferoapical walls. This fits a pattern for a stress induced cardiomyopathy (Takotsubo Cardiomyopathy). Suspect stress induced from seizures. Treat medically. She is on an ACE-I and BB, lisinopril 10 mg and metoprolol succinate 12.5 mg daily. Titrate meds as BP and HR allows. EF should hopefully improve. Consider rechecking echo in 4-6 weeks.   2. Mild Nonobstructive CAD: medical management/ risk factor modification. Continue BB and statin.   3. Seizure Disorder: per IM.  4. DM: per IM.   5. HLD: LDL 89 mg/dL. Goal given CAD is < 70 mg/dL. Pt on simvastatin 40 mg. Recommend changing to more potent statin, such has Lipitor or Crestor.   For questions or updates, please contact [facility] Please consult [email or web address] for Continue statin and BB. Control DM.  contact info under Cardiology/STEMI.      Signed, Robbie Lis, PA-C  03/18/2018, 7:53 AM

## 2018-03-26 ENCOUNTER — Ambulatory Visit: Payer: Self-pay | Admitting: Psychology

## 2018-04-01 ENCOUNTER — Ambulatory Visit: Payer: Medicaid Other

## 2018-04-07 NOTE — Progress Notes (Signed)
Cardiology Office Note    Date:  04/08/2018   ID:  Savannah Pena, DOB [birth year], MRN [id number]  PCP:  Benita Stabile, MD  Cardiologist: Dina Rich, MD    Chief Complaint  Patient presents with  . Hospitalization Follow-up    History of Present Illness:    Savannah Pena is a 57 y.o. female with past medical history of Type 2 DM, COPD, seizure disorder and obesity who presents to the office today for hospital follow-up.  She was recently admitted to [facility] on 03/15/2018 for evaluation after a witnessed seizure. During admission, she was found to have an elevated troponin value of 1.36 and Cardiology was consulted for further evaluation. An echocardiogram was performed and showed a reduced EF of 35 to 40% with regions of HK. She was therefore transferred to [facility] for a cardiac catheterization. This was performed on 03/17/2018 and showed mild nonobstructive CAD with 10% Prox RCA to Mid RCA stenosis, 20% mid-LCx stenosis, and 40% Prox LAD stenosis. Findings were thought to be most consistent with a stress-induced cardiomyopathy in the setting of her recent seizure and medical management of this was recommended. She was therefore started on Toprol-XL 25 mg daily and continued on PTA Lisinopril 10 mg daily. Was also restarted on Dilantin by Neurology.   In talking with the patient today, she reports overall doing well from a cardiac perspective since her recent hospitalization. She denies any recent chest pain or dyspnea on exertion. No recent orthopnea, PND, lower extremity edema, or palpitations.  She has been under increased emotional stress as she just learned that her mother passed away earlier this morning who lived in [place]. She has not talked to her mom in several years and notes that she is very stressed about talking with her family members over the next several days.   She reports good compliance with her medication regimen but in reviewing records it appears she has been on  both Atorvastatin and Crestor. She has not checked her blood pressure regularly at home but it is well controlled at 128/80 during today's visit.  Past Medical History:  Diagnosis Date  . Anxiety   . Arthritis   . Asthma   . Attention deficit disorder (ADD)   . COPD (chronic obstructive pulmonary disease) (HCC)   . Diabetes mellitus without complication (HCC)   . Dyspnea    with exertion  . GERD (gastroesophageal reflux disease)   . History of kidney stones   . Hypertension   . Major depression   . Seizures (HCC)    last seizure 23 years ago  . Sleep apnea    has C-PAP, but does not currently use  . Takotsubo cardiomyopathy    a. EF 35-40% by echo in 03/2018 with cath showing mild, nonobstructive CAD.     Past Surgical History:  Procedure Laterality Date  . CESAREAN SECTION    . CYSTOSCOPY WITH RETROGRADE PYELOGRAM, URETEROSCOPY AND STENT PLACEMENT Right 12/05/2016   Procedure: CYSTOSCOPY WITH RETROGRADE PYELOGRAM, URETEROSCOPY AND STENT PLACEMENT;  Surgeon: Sebastian Ache, MD;  Location: [facility] ORS;  Service: Urology;  Laterality: Right;  . CYSTOSCOPY WITH STENT PLACEMENT Right 11/06/2016   Procedure: CYSTOSCOPY WITH STENT PLACEMENT;  Surgeon: Bjorn Pippin, MD;  Location: [facility] ORS;  Service: Urology;  Laterality: Right;  . FLEXOR TENDON REPAIR Right    foot  . KIDNEY STONE SURGERY    . LEFT HEART CATH AND CORONARY ANGIOGRAPHY N/A 03/17/2018   Procedure: LEFT HEART CATH AND  CORONARY ANGIOGRAPHY;  Surgeon: Kathleene Hazel, MD;  Location: [facility];  Service: Cardiovascular;  Laterality: N/A;    Current Medications: Outpatient Medications Prior to Visit  Medication Sig Dispense Refill  . albuterol (PROVENTIL) (2.5 MG/3ML) 0.083% nebulizer solution USE 1 VIAL IN NEBULIZER 4 TIMES A DAY AS NEEDED  5  . aspirin EC 81 MG EC tablet Take 1 tablet (81 mg total) by mouth daily.    . diclofenac (VOLTAREN) 50 MG EC tablet Take 50 mg by mouth 2 (two) times daily.  5  .  DULoxetine (CYMBALTA) 30 MG capsule Take 3 capsules (90 mg total) by mouth daily. 90 capsule 0  . fluticasone (FLOVENT DISKUS) 50 MCG/BLIST diskus inhaler Inhale 1 puff into the lungs 2 (two) times daily.    Marland Kitchen gabapentin (NEURONTIN) 300 MG capsule Take 300 mg by mouth 2 (two) times daily.  5  . loratadine (CLARITIN) 10 MG tablet Take 1 tablet (10 mg total) by mouth daily.    . metFORMIN (GLUCOPHAGE) 500 MG tablet Take 1 tablet (500 mg total) by mouth 2 (two) times daily with a meal. Start 03/20/2018 60 tablet 0  . montelukast (SINGULAIR) 10 MG tablet Take 10 mg by mouth daily.    Marland Kitchen omeprazole (PRILOSEC) 40 MG capsule Take 40 mg by mouth daily.    Marland Kitchen oxybutynin (DITROPAN) 5 MG tablet Take 1 tablet (5 mg total) by mouth every 8 (eight) hours as needed for bladder spasms. And stent discomfort. 90 tablet 1  . phenytoin (DILANTIN) 200 MG ER capsule Take 1 capsule (200 mg total) by mouth 2 (two) times daily. 60 capsule 0  . PROAIR HFA 108 (90 Base) MCG/ACT inhaler 1-2 PUFFS EVERY 4-6 HOURS AS NEEDED FOR SHORTNESS OF BREATH  3  . Tiotropium Bromide-Olodaterol (STIOLTO RESPIMAT) 2.5-2.5 MCG/ACT AERS Inhale into the lungs.    . Vitamin D, Ergocalciferol, (DRISDOL) 50000 units CAPS capsule Take 50,000 Units by mouth every 7 (seven) days.    Marland Kitchen atorvastatin (LIPITOR) 40 MG tablet Take 1 tablet (40 mg total) by mouth daily at 6 PM. 30 tablet 0  . lisinopril (PRINIVIL,ZESTRIL) 10 MG tablet Take 1 tablet (10 mg total) by mouth daily. 30 tablet 0  . metoprolol succinate (TOPROL-XL) 25 MG 24 hr tablet Take 0.5 tablets (12.5 mg total) by mouth daily. 30 tablet 0  . rosuvastatin (CRESTOR) 10 MG tablet Take 1 tablet (10 mg total) by mouth at bedtime. 30 tablet 11   No facility-administered medications prior to visit.      Allergies:   Fruit extracts   Social History   Socioeconomic History  . Marital status: Single    Spouse name: Not on file  . Number of children: Not on file  . Years of education: Not on  file  . Highest education level: Not on file  Occupational History  . Not on file  Social Needs  . Financial resource strain: Not on file  . Food insecurity:    Worry: Not on file    Inability: Not on file  . Transportation needs:    Medical: Not on file    Non-medical: Not on file  Tobacco Use  . Smoking status: Former Smoker    Types: Cigarettes    Last attempt to quit: 11/12/2016    Years since quitting: 1.4  . Smokeless tobacco: Former Neurosurgeon  . Tobacco comment: 04-08-2017 per pt stopped in Jan 2018  Substance and Sexual Activity  . Alcohol use: Yes  Comment: occassional, 04-08-2017 per pt occas.  . Drug use: No    Comment: 04-08-2017 per pt no   . Sexual activity: Not on file  Lifestyle  . Physical activity:    Days per week: Not on file    Minutes per session: Not on file  . Stress: Not on file  Relationships  . Social connections:    Talks on phone: Not on file    Gets together: Not on file    Attends religious service: Not on file    Active member of club or organization: Not on file    Attends meetings of clubs or organizations: Not on file    Relationship status: Not on file  Other Topics Concern  . Not on file  Social History Narrative  . Not on file     Family History:  The patient's family history includes Diabetes in her father; Prostate cancer in her father.   Review of Systems:   Please see the history of present illness.     General:  No chills, fever, night sweats or weight changes. Positive for increased stress.  Cardiovascular:  No chest pain, dyspnea on exertion, edema, orthopnea, palpitations, paroxysmal nocturnal dyspnea. Dermatological: No rash, lesions/masses Respiratory: No cough, dyspnea Urologic: No hematuria, dysuria Abdominal:   No nausea, vomiting, diarrhea, bright red blood per rectum, melena, or hematemesis Neurologic:  No visual changes, wkns, changes in mental status. All other systems reviewed and are otherwise negative except as  noted above.   Physical Exam:    VS:  BP 128/80 (BP Location: Right Arm)   Pulse 89   Ht 5\' 4"  (1.626 m)   Wt 235 lb (106.6 kg)   LMP 12/06/2011 (Approximate)   SpO2 99%   BMI 40.34 kg/m    General: Well developed, well nourished Caucasian female appearing in no acute distress. Head: Normocephalic, atraumatic, sclera non-icteric, no xanthomas, nares are without discharge.  Neck: No carotid bruits. JVD not elevated.  Lungs: Respirations regular and unlabored, without wheezes or rales.  Heart: Regular rate and rhythm. No S3 or S4.  No murmur, no rubs, or gallops appreciated. Abdomen: Soft, non-tender, non-distended with normoactive bowel sounds. No hepatomegaly. No rebound/guarding. No obvious abdominal masses. Msk:  Strength and tone appear normal for age. No joint deformities or effusions. Extremities: No clubbing or cyanosis. No lower extremity edema.  Distal pedal pulses are 2+ bilaterally. Neuro: Alert and oriented X 3. Moves all extremities spontaneously. No focal deficits noted. Psych:  Responds to questions appropriately with a normal affect. Skin: No rashes or lesions noted  Wt Readings from Last 3 Encounters:  04/08/18 235 lb (106.6 kg)  03/18/18 241 lb 13.5 oz (109.7 kg)  12/22/16 241 lb 12.8 oz (109.7 kg)     Studies/Labs Reviewed:   EKG:  EKG is not ordered today.   Recent Labs: 03/17/2018: ALT 29 03/18/2018: BUN 12; Creatinine, Ser 0.66; Hemoglobin 12.5; Platelets 249; Potassium 4.0; Sodium 140   Lipid Panel    Component Value Date/Time   CHOL 199 03/17/2018 0458   TRIG 309 (H) 03/17/2018 0458   HDL 48 03/17/2018 0458   CHOLHDL 4.1 03/17/2018 0458   VLDL 62 (H) 03/17/2018 0458   LDLCALC 89 03/17/2018 0458    Additional studies/ records that were reviewed today include:   Echocardiogram: 03/16/2018 Study Conclusions  - Left ventricle: The cavity size was normal. Wall thickness was   increased in a pattern of mild LVH. Systolic function was   moderately  reduced. The estimated ejection fraction was in the   range of 35% to 40%. - Regional wall motion abnormality: Hypokinesis of the mid-apical   anterior, mid anteroseptal, mid inferolateral, mid anterolateral,   apical septal, apical lateral, and apical myocardium. Diastolic   function is abnormal, indeterminant grade. - Aortic valve: Valve area (VTI): 3.08 cm^2. Valve area (Vmax):   2.81 cm^2. - Atrial septum: No defect or patent foramen ovale was identified.  Cardiac Catheterization: 03/17/2018  Prox RCA to Mid RCA lesion is 10% stenosed.  Mid Cx lesion is 20% stenosed.  Prox LAD to Mid LAD lesion is 40% stenosed.  There is mild to moderate left ventricular systolic dysfunction.  LV end diastolic pressure is normal.  The left ventricular ejection fraction is 35-45% by visual estimate.  There is no mitral valve regurgitation.   1. Mild non-obstructive CAD 2. Moderate LV systolic dysfunction with segmental wall motion abnormality involving the apex, anteroapical and inferoapical walls. This fits a pattern for a stress induced cardiomyopathy. (Takotsubo Cardiomyopathy).   Recommendations: Medical management of mild CAD. Medical management of her non-ischemic cardiomyopathy.   Assessment:    1. Takotsubo cardiomyopathy   2. Coronary artery disease involving native coronary artery of native heart without angina pectoris   3. Essential hypertension   4. Hyperlipidemia LDL goal <70   5. Type 2 diabetes mellitus without complication, without long-term current use of insulin (HCC)      Plan:   In order of problems listed above:  1. Stress-Induced (Takotsubo) Cardiomyopathy - the patient was recently admitted for a seizure and was found to have a reduced EF of 35-40% with cath showing  mild nonobstructive CAD. Findings were thought to be most consistent with a stress-induced cardiomyopathy and she was started on Toprol-XL 25 mg daily and continued on PTA Lisinopril 10 mg daily.    - she reports overall doing well from a cardiac perspective and denies any recent dyspnea on exertion, orthopnea, PND,or  lower extremity edema. Has been under increased stress with the recent passing of her mother.  - will further titrate Toprol-XL from 12.5mg  daily to 25mg  daily and continue on Lisinopril 10mg  daily. Reports her PCP was concerned about her being on a BB in the setting of her known asthma but she denies any recent exacerbations and is not actively wheezing, therefore would recommended she remain on a cardioselective BB at this time given her cardiomyopathy. Will plan for a repeat echocardiogram in 8 weeks to reassess EF.   2. CAD - mild nonobstructive CAD by recent cardiac catheterization as outlined above. - she denies any recent chest pain or dyspnea on exertion.  - continue ASA, statin, and BB therapy.   3. HTN - BP is well-controlled at 128/80 during today's visit. - continue Lisinopril 10mg  daily and Toprol-XL with dose adjustment as outlined above.   4. HLD - FLP earlier this morning showed total cholesterol of 199, HDL 48, Triglycerides 309, and LDL 89. Not at goal of LDL less than 70 and was switched to Atorvastatin 40 mg daily. In reviewing her medication list, it is unclear how or why she is on both Atorvastatin and Crestor 10mg  daily. Will discontinue Crestor and continue Atorvastatin. Will need repeat FLP and LFT's in 6-8 weeks.   5. Type 2 DM - Hgb A1c stable at 6.6 when checked earlier this month. - followed by PCP.    Medication Adjustments/Labs and Tests Ordered: Current medicines are reviewed at length with the patient today.  Concerns regarding medicines are outlined above.  Medication changes, Labs and Tests ordered today are listed in the Patient Instructions below. Patient Instructions  Medication Instructions:  Your physician has recommended you make the following change in your medication:  Stop Taking Crestor  Increase Toprol XL 25 mg Daily    Labwork: NONE   Testing/Procedures: Your physician has requested that you have an echocardiogram. Echocardiography is a painless test that uses sound waves to create images of your heart. It provides your doctor with information about the size and shape of your heart and how well your heart's chambers and valves are working. This procedure takes approximately one hour. There are no restrictions for this procedure.  Follow-Up: Your physician recommends that you schedule a follow-up appointment in: 3 Months with Dr. Wyline Mood.   Any Other Special Instructions Will Be Listed Below (If Applicable).  If you need a refill on your cardiac medications before your next appointment, please call your pharmacy.  Thank you for choosing [facility]!    Signed, Ellsworth Lennox, PA-C  04/08/2018 9:04 PM    [facility] [address] [place], [place] [address] Phone: [phone]

## 2018-04-08 ENCOUNTER — Ambulatory Visit (INDEPENDENT_AMBULATORY_CARE_PROVIDER_SITE_OTHER): Payer: Medicaid Other | Admitting: Student

## 2018-04-08 ENCOUNTER — Encounter: Payer: Self-pay | Admitting: Student

## 2018-04-08 VITALS — BP 128/80 | HR 89 | Ht 64.0 in | Wt 235.0 lb

## 2018-04-08 DIAGNOSIS — E785 Hyperlipidemia, unspecified: Secondary | ICD-10-CM

## 2018-04-08 DIAGNOSIS — E119 Type 2 diabetes mellitus without complications: Secondary | ICD-10-CM

## 2018-04-08 DIAGNOSIS — I2510 Atherosclerotic heart disease of native coronary artery without angina pectoris: Secondary | ICD-10-CM | POA: Diagnosis not present

## 2018-04-08 DIAGNOSIS — I5181 Takotsubo syndrome: Secondary | ICD-10-CM

## 2018-04-08 DIAGNOSIS — I10 Essential (primary) hypertension: Secondary | ICD-10-CM

## 2018-04-08 MED ORDER — LISINOPRIL 10 MG PO TABS: 10.0000 mg | ORAL_TABLET | Freq: Every day | ORAL | 3 refills | Status: AC

## 2018-04-08 MED ORDER — METOPROLOL SUCCINATE ER 25 MG PO TB24: 25.0000 mg | ORAL_TABLET | Freq: Every day | ORAL | 3 refills | Status: AC

## 2018-04-08 MED ORDER — ATORVASTATIN CALCIUM 40 MG PO TABS: 40.0000 mg | ORAL_TABLET | Freq: Every day | ORAL | 3 refills | Status: AC

## 2018-04-08 NOTE — Patient Instructions (Signed)
Medication Instructions:  Your physician has recommended you make the following change in your medication:  Stop Taking Crestor  Increase Toprol XL 25 mg Daily     Labwork: NONE   Testing/Procedures: Your physician has requested that you have an echocardiogram. Echocardiography is a painless test that uses sound waves to create images of your heart. It provides your doctor with information about the size and shape of your heart and how well your heart's chambers and valves are working. This procedure takes approximately one hour. There are no restrictions for this procedure.    Follow-Up: Your physician recommends that you schedule a follow-up appointment in: 3 Months with Dr. Wyline Mood.    Any Other Special Instructions Will Be Listed Below (If Applicable).     If you need a refill on your cardiac medications before your next appointment, please call your pharmacy.  Thank you for choosing [facility]!

## 2018-04-15 ENCOUNTER — Other Ambulatory Visit (HOSPITAL_COMMUNITY): Payer: Self-pay

## 2018-05-10 ENCOUNTER — Encounter: Payer: Self-pay | Admitting: Psychology

## 2018-05-10 ENCOUNTER — Encounter: Payer: Medicaid Other | Attending: Psychology | Admitting: Psychology

## 2018-05-10 DIAGNOSIS — K219 Gastro-esophageal reflux disease without esophagitis: Secondary | ICD-10-CM | POA: Diagnosis not present

## 2018-05-10 DIAGNOSIS — E119 Type 2 diabetes mellitus without complications: Secondary | ICD-10-CM | POA: Insufficient documentation

## 2018-05-10 DIAGNOSIS — F331 Major depressive disorder, recurrent, moderate: Secondary | ICD-10-CM

## 2018-05-10 DIAGNOSIS — F909 Attention-deficit hyperactivity disorder, unspecified type: Secondary | ICD-10-CM | POA: Insufficient documentation

## 2018-05-10 DIAGNOSIS — F908 Attention-deficit hyperactivity disorder, other type: Secondary | ICD-10-CM

## 2018-05-10 DIAGNOSIS — J449 Chronic obstructive pulmonary disease, unspecified: Secondary | ICD-10-CM | POA: Diagnosis not present

## 2018-05-10 DIAGNOSIS — F338 Other recurrent depressive disorders: Secondary | ICD-10-CM | POA: Diagnosis not present

## 2018-05-10 DIAGNOSIS — F4310 Post-traumatic stress disorder, unspecified: Secondary | ICD-10-CM

## 2018-05-10 NOTE — Progress Notes (Signed)
Today, the patient completed the comprehensive attention battery as well as the CPT measure.  The patient appeared to fully participate and try her hardest throughout this 2 hours of testing.  These measures were administered by myself personally.  This does appear to be a fair and valid assessment.  The results will be scored, interpreted with formal report writing produced on 05/18/2018 and feedback will be provided to the patient on 05/24/2018.  Administration time was 2 hours today.

## 2018-05-14 ENCOUNTER — Telehealth: Payer: Self-pay | Admitting: Urology

## 2018-05-17 ENCOUNTER — Ambulatory Visit: Payer: Medicaid Other

## 2018-05-18 ENCOUNTER — Encounter (HOSPITAL_BASED_OUTPATIENT_CLINIC_OR_DEPARTMENT_OTHER): Payer: Medicaid Other | Admitting: Psychology

## 2018-05-18 ENCOUNTER — Encounter: Payer: Self-pay | Admitting: Psychology

## 2018-05-18 DIAGNOSIS — F4310 Post-traumatic stress disorder, unspecified: Secondary | ICD-10-CM

## 2018-05-18 DIAGNOSIS — S0990XS Unspecified injury of head, sequela: Secondary | ICD-10-CM

## 2018-05-18 DIAGNOSIS — F331 Major depressive disorder, recurrent, moderate: Secondary | ICD-10-CM | POA: Diagnosis not present
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
## 2018-05-19 ENCOUNTER — Encounter: Payer: Self-pay | Admitting: Psychology

## 2018-05-19 ENCOUNTER — Ambulatory Visit
Admission: RE | Admit: 2018-05-19 | Discharge: 2018-05-19 | Disposition: A | Discharge status: Home / Self Care | Payer: Medicaid Other | Source: Ambulatory Visit | Attending: Urology | Admitting: Urology

## 2018-05-19 ENCOUNTER — Encounter: Payer: Medicaid Other | Admitting: Psychology

## 2018-05-19 DIAGNOSIS — J449 Chronic obstructive pulmonary disease, unspecified: Secondary | ICD-10-CM | POA: Diagnosis not present

## 2018-05-19 DIAGNOSIS — Z87442 Personal history of urinary calculi: Secondary | ICD-10-CM | POA: Insufficient documentation

## 2018-05-19 DIAGNOSIS — F4310 Post-traumatic stress disorder, unspecified: Secondary | ICD-10-CM | POA: Diagnosis not present

## 2018-05-19 DIAGNOSIS — F331 Major depressive disorder, recurrent, moderate: Secondary | ICD-10-CM

## 2018-05-19 DIAGNOSIS — Z9889 Other specified postprocedural states: Secondary | ICD-10-CM | POA: Diagnosis not present

## 2018-05-19 DIAGNOSIS — N201 Calculus of ureter: Secondary | ICD-10-CM

## 2018-05-19 DIAGNOSIS — E119 Type 2 diabetes mellitus without complications: Secondary | ICD-10-CM | POA: Diagnosis not present

## 2018-05-19 DIAGNOSIS — Z09 Encounter for follow-up examination after completed treatment for conditions other than malignant neoplasm: Secondary | ICD-10-CM | POA: Insufficient documentation

## 2018-05-19 DIAGNOSIS — F338 Other recurrent depressive disorders: Secondary | ICD-10-CM | POA: Diagnosis not present

## 2018-05-19 DIAGNOSIS — K219 Gastro-esophageal reflux disease without esophagitis: Secondary | ICD-10-CM | POA: Diagnosis not present

## 2018-05-19 DIAGNOSIS — F909 Attention-deficit hyperactivity disorder, unspecified type: Secondary | ICD-10-CM | POA: Diagnosis not present
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
## 2018-05-20 ENCOUNTER — Encounter: Payer: Self-pay | Admitting: Urology

## 2018-05-20 ENCOUNTER — Ambulatory Visit (INDEPENDENT_AMBULATORY_CARE_PROVIDER_SITE_OTHER): Payer: Medicaid Other | Admitting: Urology

## 2018-05-20 VITALS — BP 112/73 | HR 78 | Ht 64.0 in | Wt 230.0 lb

## 2018-05-20 DIAGNOSIS — Z87442 Personal history of urinary calculi: Secondary | ICD-10-CM | POA: Diagnosis not present

## 2018-05-20 NOTE — Progress Notes (Signed)
05/20/2018 1:48 PM   Savannah Pena [birth year] [id number]  Referring provider: Evelene Croon, MD [address] [place], [place] [address]  Chief Complaint  Patient presents with  . Nephrolithiasis    1year   Urologic problem list: 1 - Recurrent Nephrolithiasis -  Pre 2018 -  SWL x2, cystolithalopexy x1. 11/2016 - medical passage 5mm ureteral stone after stenting (ureteroscopy after confirms stone free).   2 - Medical Stone Disease / Hyperoxaluria / Hypocitraturia  -  Eval 2018: BMP (low-normal Ca 8.4), PTH, Urate - normal; Composition - pending; 24 hr urines - mild high oxalate (40) and low citrate   HPI: 57 year old female presents for annual follow-up of the above problem list.  She was last seen in May 2018 by Dr. Berneice Heinrich.  She has been doing well and denies recurrent episodes of renal colic, passing calculi or flank/abdominal/pelvic pain.  She denies dysuria or gross hematuria.  KUB and renal ultrasound performed today showed no evidence of calculi.  Ultrasound shows no mass or hydronephrosis.  She remains on potassium citrate.   PMH: Past Medical History:  Diagnosis Date  . Anxiety   . Arthritis   . Asthma   . Attention deficit disorder (ADD)   . COPD (chronic obstructive pulmonary disease) (HCC)   . Diabetes mellitus without complication (HCC)   . Dyspnea    with exertion  . GERD (gastroesophageal reflux disease)   . History of kidney stones   . Hypertension   . Major depression   . Seizures (HCC)    last seizure 23 years ago  . Sleep apnea    has C-PAP, but does not currently use  . Takotsubo cardiomyopathy    a. EF 35-40% by echo in 03/2018 with cath showing mild, nonobstructive CAD.     Surgical History: Past Surgical History:  Procedure Laterality Date  . CESAREAN SECTION    . CYSTOSCOPY WITH RETROGRADE PYELOGRAM, URETEROSCOPY AND STENT PLACEMENT Right 12/05/2016   Procedure: CYSTOSCOPY WITH RETROGRADE PYELOGRAM, URETEROSCOPY AND STENT PLACEMENT;   Surgeon: Sebastian Ache, MD;  Location: [facility];  Service: Urology;  Laterality: Right;  . CYSTOSCOPY WITH STENT PLACEMENT Right 11/06/2016   Procedure: CYSTOSCOPY WITH STENT PLACEMENT;  Surgeon: Bjorn Pippin, MD;  Location: [facility];  Service: Urology;  Laterality: Right;  . FLEXOR TENDON REPAIR Right    foot  . KIDNEY STONE SURGERY    . LEFT HEART CATH AND CORONARY ANGIOGRAPHY N/A 03/17/2018   Procedure: LEFT HEART CATH AND CORONARY ANGIOGRAPHY;  Surgeon: Kathleene Hazel, MD;  Location: [facility];  Service: Cardiovascular;  Laterality: N/A;    Home Medications:  Allergies as of 05/20/2018      Reactions   Fruit Extracts Nausea And Vomiting   Bananas      Medication List        Accurate as of 05/20/18  1:48 PM. Always use your most recent med list.          ADDERALL XR 15 MG 24 hr capsule Generic drug:  amphetamine-dextroamphetamine Take by mouth daily.   aspirin 81 MG EC tablet Take 1 tablet (81 mg total) by mouth daily.   atorvastatin 40 MG tablet Commonly known as:  LIPITOR Take 1 tablet (40 mg total) by mouth daily at 6 PM.   buPROPion 150 MG 24 hr tablet Commonly known as:  WELLBUTRIN XL Take 150 mg by mouth daily.   D3-50 50000 units capsule Generic drug:  Cholecalciferol Take 50,000 Units by mouth once a  week.   diclofenac 50 MG EC tablet Commonly known as:  VOLTAREN Take 50 mg by mouth 2 (two) times daily.   DULoxetine 30 MG capsule Commonly known as:  CYMBALTA Take 3 capsules (90 mg total) by mouth daily.   DULoxetine 60 MG capsule Commonly known as:  CYMBALTA Take by mouth daily.   FLOVENT HFA 220 MCG/ACT inhaler Generic drug:  fluticasone INHALE BY MOUTH TWO TIMES A DAY   fluticasone 50 MCG/BLIST diskus inhaler Commonly known as:  FLOVENT DISKUS Inhale 1 puff into the lungs 2 (two) times daily.   gabapentin 300 MG capsule Commonly known as:  NEURONTIN Take 300 mg by mouth 2 (two) times daily.   lisinopril 10 MG  tablet Commonly known as:  PRINIVIL,ZESTRIL Take 1 tablet (10 mg total) by mouth daily.   loratadine 10 MG tablet Commonly known as:  CLARITIN Take 1 tablet (10 mg total) by mouth daily.   metFORMIN 500 MG tablet Commonly known as:  GLUCOPHAGE Take 1 tablet (500 mg total) by mouth 2 (two) times daily with a meal. Start 03/20/2018   metoprolol succinate 25 MG 24 hr tablet Commonly known as:  TOPROL XL Take 1 tablet (25 mg total) by mouth daily.   montelukast 10 MG tablet Commonly known as:  SINGULAIR Take 10 mg by mouth daily.   omeprazole 40 MG capsule Commonly known as:  PRILOSEC Take 40 mg by mouth daily.   oxybutynin 5 MG tablet Commonly known as:  DITROPAN Take 1 tablet (5 mg total) by mouth every 8 (eight) hours as needed for bladder spasms. And stent discomfort.   phenytoin 200 MG ER capsule Commonly known as:  DILANTIN Take 1 capsule (200 mg total) by mouth 2 (two) times daily.   albuterol (2.5 MG/3ML) 0.083% nebulizer solution Commonly known as:  PROVENTIL USE 1 VIAL IN NEBULIZER 4 TIMES A DAY AS NEEDED   PROAIR HFA 108 (90 Base) MCG/ACT inhaler Generic drug:  albuterol 1-2 PUFFS EVERY 4-6 HOURS AS NEEDED FOR SHORTNESS OF BREATH   STIOLTO RESPIMAT 2.5-2.5 MCG/ACT Aers Generic drug:  Tiotropium Bromide-Olodaterol Inhale into the lungs.   terbinafine 250 MG tablet Commonly known as:  LAMISIL Take 250 mg by mouth daily.   Vitamin D (Ergocalciferol) 50000 units Caps capsule Commonly known as:  DRISDOL Take 50,000 Units by mouth every 7 (seven) days.       Allergies:  Allergies  Allergen Reactions  . Fruit Extracts Nausea And Vomiting    Bananas    Family History: Family History  Problem Relation Age of Onset  . Prostate cancer Father   . Diabetes Father     Social History:  reports that she quit smoking about 18 months ago. Her smoking use included cigarettes. She has quit using smokeless tobacco. She reports that she drinks alcohol. She reports  that she does not use drugs.  ROS: UROLOGY Frequent Urination?: No Hard to postpone urination?: No Burning/pain with urination?: No Get up at night to urinate?: Yes Leakage of urine?: No Urine stream starts and stops?: No Trouble starting stream?: No Do you have to strain to urinate?: No Blood in urine?: No Urinary tract infection?: No Sexually transmitted disease?: No Injury to kidneys or bladder?: No Painful intercourse?: No Weak stream?: No Currently pregnant?: No Vaginal bleeding?: No Last menstrual period?: n  Gastrointestinal Nausea?: No Vomiting?: No Indigestion/heartburn?: No Diarrhea?: No Constipation?: No  Constitutional Fever: No Night sweats?: No Weight loss?: No Fatigue?: Yes  Skin Skin rash/lesions?: No Itching?:  No  Eyes Blurred vision?: No Double vision?: No  Ears/Nose/Throat Sore throat?: No Sinus problems?: No  Hematologic/Lymphatic Swollen glands?: No Easy bruising?: No  Cardiovascular Leg swelling?: No Chest pain?: No  Respiratory Cough?: No Shortness of breath?: No  Endocrine Excessive thirst?: No  Musculoskeletal Back pain?: Yes Joint pain?: No  Neurological Headaches?: No Dizziness?: No  Psychologic Depression?: Yes Anxiety?: Yes  Physical Exam: BP 112/73   Pulse 78   Ht 5\' 4"  (1.626 m)   Wt 230 lb (104.3 kg)   LMP 12/06/2011 (Approximate)   BMI 39.48 kg/m   Constitutional:  Alert and oriented, No acute distress. HEENT: [place] AT, moist mucus membranes.  Trachea midline, no masses. Cardiovascular: No clubbing, cyanosis, or edema. Respiratory: Normal respiratory effort, no increased work of breathing. GU: No CVA tenderness Lymph: No cervical or inguinal lymphadenopathy. Skin: No rashes, bruises or suspicious lesions. Neurologic: Grossly intact, no focal deficits, moving all 4 extremities. Psychiatric: Normal mood and affect.   Pertinent Imaging: Images were personally reviewed Results for orders placed  during the hospital encounter of 05/19/18  DG Abd 1 View   Narrative CLINICAL DATA:  History of kidney stones, follow-up  EXAM: ABDOMEN - 1 VIEW  COMPARISON:  CT abdomen pelvis of 11/05/2016  FINDINGS: By plain film no renal or ureteral calculi are seen. Both kidneys overlying by a large amount of fecal material, making assessment difficult particularly for small calculi as were noted on the prior CT. There are degenerative changes in the lumbar spine.  IMPRESSION: No definite renal or ureteral calculi. However a moderately large amount of feces is noted diminishing detection of small calculi.   Electronically Signed   By: Dwyane Dee M.D.   On: 05/20/2018 08:50     Results for orders placed during the hospital encounter of 05/19/18  Ultrasound renal complete   Narrative CLINICAL DATA:  Patient status post lithotripsy 12/05/2016. History of urinary tract stones.  EXAM: RENAL / URINARY TRACT ULTRASOUND COMPLETE  COMPARISON:  KUB today.  CT abdomen and pelvis 11/05/2016.  FINDINGS: Right Kidney:  Length: 12.7 cm. Echogenicity within normal limits. No mass or hydronephrosis visualized.  Left Kidney:  Length: 12.2 cm. Echogenicity within normal limits. No mass or hydronephrosis visualized.  Bladder:  Appears normal for degree of bladder distention.  IMPRESSION: Negative exam.   Electronically Signed   By: Drusilla Kanner M.D.   On: 05/20/2018 08:54       Assessment & Plan:   57 year old female with a history of recurrent stone disease currently without evidence of calculi on ultrasound/KUB.  Continue potassium citrate.  Follow-up 1 year with a KUB.   Riki Altes, MD  [facility] [address] [place], [place] [address] [phone]

## 2018-05-24 ENCOUNTER — Ambulatory Visit: Payer: Self-pay | Admitting: Psychology

## 2018-06-09 ENCOUNTER — Ambulatory Visit (HOSPITAL_COMMUNITY)
Admission: RE | Admit: 2018-06-09 | Discharge: 2018-06-09 | Disposition: A | Discharge status: Home / Self Care | Payer: Medicaid Other | Source: Ambulatory Visit | Attending: Student | Admitting: Student

## 2018-06-09 DIAGNOSIS — I214 Non-ST elevation (NSTEMI) myocardial infarction: Secondary | ICD-10-CM | POA: Diagnosis not present

## 2018-06-09 DIAGNOSIS — K219 Gastro-esophageal reflux disease without esophagitis: Secondary | ICD-10-CM | POA: Insufficient documentation

## 2018-06-09 DIAGNOSIS — G4730 Sleep apnea, unspecified: Secondary | ICD-10-CM | POA: Diagnosis not present

## 2018-06-09 DIAGNOSIS — E7800 Pure hypercholesterolemia, unspecified: Secondary | ICD-10-CM | POA: Insufficient documentation

## 2018-06-09 DIAGNOSIS — I5181 Takotsubo syndrome: Secondary | ICD-10-CM

## 2018-06-09 DIAGNOSIS — E119 Type 2 diabetes mellitus without complications: Secondary | ICD-10-CM | POA: Diagnosis not present

## 2018-06-09 DIAGNOSIS — I119 Hypertensive heart disease without heart failure: Secondary | ICD-10-CM | POA: Insufficient documentation

## 2018-06-09 DIAGNOSIS — J449 Chronic obstructive pulmonary disease, unspecified: Secondary | ICD-10-CM | POA: Diagnosis not present

## 2018-06-09 DIAGNOSIS — I080 Rheumatic disorders of both mitral and aortic valves: Secondary | ICD-10-CM | POA: Insufficient documentation

## 2018-06-09 NOTE — Progress Notes (Signed)
*  PRELIMINARY RESULTS* Echocardiogram 2D Echocardiogram has been performed.  Stacey Drain 06/09/2018, 11:16 AM

## 2018-06-10 ENCOUNTER — Telehealth: Payer: Self-pay | Admitting: *Deleted

## 2018-06-10 DIAGNOSIS — H5213 Myopia, bilateral: Secondary | ICD-10-CM | POA: Diagnosis not present

## 2018-06-10 DIAGNOSIS — H524 Presbyopia: Secondary | ICD-10-CM | POA: Diagnosis not present

## 2018-06-10 NOTE — Telephone Encounter (Signed)
Called patient with test results. No answer. Left message to call back.  

## 2018-06-10 NOTE — Telephone Encounter (Signed)
-----   Message from Ellsworth Lennox, [place] sent at 06/10/2018 10:34 AM EDT ----- Please let the patient know that the pumping function of her heart has normalized. Normal EF is 55-65% and this was previously reduced to 35-40% in 03/2018. Now improved back to 60-65%. Continue with current medication regimen and keep scheduled follow-up with Dr. Wyline Mood as this will be reviewed in more detail at that time. Please forward a copy to Benita Stabile, MD. Thank you.

## 2018-06-10 NOTE — Telephone Encounter (Signed)
Returning call to Kisha  °

## 2018-06-10 NOTE — Telephone Encounter (Signed)
Pt notified of test results

## 2018-07-09 ENCOUNTER — Ambulatory Visit (INDEPENDENT_AMBULATORY_CARE_PROVIDER_SITE_OTHER): Payer: Medicaid Other | Admitting: Cardiology

## 2018-07-09 ENCOUNTER — Encounter: Payer: Self-pay | Admitting: *Deleted

## 2018-07-09 ENCOUNTER — Encounter: Payer: Self-pay | Admitting: Cardiology

## 2018-07-09 VITALS — BP 138/84 | HR 83 | Ht 64.0 in | Wt 234.6 lb

## 2018-07-09 DIAGNOSIS — I5181 Takotsubo syndrome: Secondary | ICD-10-CM

## 2018-07-09 DIAGNOSIS — I10 Essential (primary) hypertension: Secondary | ICD-10-CM

## 2018-07-09 DIAGNOSIS — I2510 Atherosclerotic heart disease of native coronary artery without angina pectoris: Secondary | ICD-10-CM | POA: Diagnosis not present

## 2018-07-09 NOTE — Patient Instructions (Signed)
Medication Instructions:  Your physician recommends that you continue on your current medications as directed. Please refer to the Current Medication list given to you today.   Labwork: NONE   Testing/Procedures: NONE   Follow-Up: Your physician wants you to follow-up in: 1 Year with Dr. Branch.  You will receive a reminder letter in the mail two months in advance. If you don't receive a letter, please call our office to schedule the follow-up appointment.   Any Other Special Instructions Will Be Listed Below (If Applicable).     If you need a refill on your cardiac medications before your next appointment, please call your pharmacy. Thank you for choosing [facility]!    

## 2018-07-09 NOTE — Progress Notes (Signed)
Clinical Summary Ms. Hell is a 57 y.o.female  1. Acute systolic heart failure - admit 03/2018 with seizure, peak trop 1.64 - - echo with LVEF 35-40%, primarily apical WMAs.  - 03/2018 cath with mild nonobstructive disease - overall presentation consistent with stress induced CM  - 05/2018 echo LVEF 60-65%, no WMAs - no recent SOB/DOE/LE edema - compliant with meds   2. CAD - mild CAD by recent cath, nonobstructive - recent atypical chest pain associated with stress  Past Medical History:  Diagnosis Date  . Anxiety   . Arthritis   . Asthma   . Attention deficit disorder (ADD)   . COPD (chronic obstructive pulmonary disease) (HCC)   . Diabetes mellitus without complication (HCC)   . Dyspnea    with exertion  . GERD (gastroesophageal reflux disease)   . History of kidney stones   . Hypertension   . Major depression   . Seizures (HCC)    last seizure 23 years ago  . Sleep apnea    has C-PAP, but does not currently use  . Takotsubo cardiomyopathy    a. EF 35-40% by echo in 03/2018 with cath showing mild, nonobstructive CAD.      Allergies  Allergen Reactions  . Fruit Extracts Nausea And Vomiting    Bananas     Current Outpatient Medications  Medication Sig Dispense Refill  . ADDERALL XR 15 MG 24 hr capsule Take by mouth daily.  0  . albuterol (PROVENTIL) (2.5 MG/3ML) 0.083% nebulizer solution USE 1 VIAL IN NEBULIZER 4 TIMES A DAY AS NEEDED  5  . aspirin EC 81 MG EC tablet Take 1 tablet (81 mg total) by mouth daily.    Marland Kitchen atorvastatin (LIPITOR) 40 MG tablet Take 1 tablet (40 mg total) by mouth daily at 6 PM. 90 tablet 3  . buPROPion (WELLBUTRIN XL) 150 MG 24 hr tablet Take 150 mg by mouth daily.  0  . D3-50 50000 units capsule Take 50,000 Units by mouth once a week.  1  . diclofenac (VOLTAREN) 50 MG EC tablet Take 50 mg by mouth 2 (two) times daily.  5  . DULoxetine (CYMBALTA) 30 MG capsule Take 3 capsules (90 mg total) by mouth daily. 90 capsule 0  .  DULoxetine (CYMBALTA) 60 MG capsule Take by mouth daily.  0  . FLOVENT HFA 220 MCG/ACT inhaler INHALE BY MOUTH TWO TIMES A DAY  5  . fluticasone (FLOVENT DISKUS) 50 MCG/BLIST diskus inhaler Inhale 1 puff into the lungs 2 (two) times daily.    Marland Kitchen gabapentin (NEURONTIN) 300 MG capsule Take 300 mg by mouth 2 (two) times daily.  5  . lisinopril (PRINIVIL,ZESTRIL) 10 MG tablet Take 1 tablet (10 mg total) by mouth daily. 90 tablet 3  . loratadine (CLARITIN) 10 MG tablet Take 1 tablet (10 mg total) by mouth daily.    . metFORMIN (GLUCOPHAGE) 500 MG tablet Take 1 tablet (500 mg total) by mouth 2 (two) times daily with a meal. Start 03/20/2018 60 tablet 0  . metoprolol succinate (TOPROL XL) 25 MG 24 hr tablet Take 1 tablet (25 mg total) by mouth daily. 90 tablet 3  . montelukast (SINGULAIR) 10 MG tablet Take 10 mg by mouth daily.    Marland Kitchen omeprazole (PRILOSEC) 40 MG capsule Take 40 mg by mouth daily.    Marland Kitchen oxybutynin (DITROPAN) 5 MG tablet Take 1 tablet (5 mg total) by mouth every 8 (eight) hours as needed for bladder spasms. And stent  discomfort. 90 tablet 1  . phenytoin (DILANTIN) 200 MG ER capsule Take 1 capsule (200 mg total) by mouth 2 (two) times daily. 60 capsule 0  . PROAIR HFA 108 (90 Base) MCG/ACT inhaler 1-2 PUFFS EVERY 4-6 HOURS AS NEEDED FOR SHORTNESS OF BREATH  3  . terbinafine (LAMISIL) 250 MG tablet Take 250 mg by mouth daily.  0  . Tiotropium Bromide-Olodaterol (STIOLTO RESPIMAT) 2.5-2.5 MCG/ACT AERS Inhale into the lungs.    . Vitamin D, Ergocalciferol, (DRISDOL) 50000 units CAPS capsule Take 50,000 Units by mouth every 7 (seven) days.     No current facility-administered medications for this visit.      Past Surgical History:  Procedure Laterality Date  . CESAREAN SECTION    . CYSTOSCOPY WITH RETROGRADE PYELOGRAM, URETEROSCOPY AND STENT PLACEMENT Right 12/05/2016   Procedure: CYSTOSCOPY WITH RETROGRADE PYELOGRAM, URETEROSCOPY AND STENT PLACEMENT;  Surgeon: Sebastian Ache, MD;  Location:  [facility];  Service: Urology;  Laterality: Right;  . CYSTOSCOPY WITH STENT PLACEMENT Right 11/06/2016   Procedure: CYSTOSCOPY WITH STENT PLACEMENT;  Surgeon: Bjorn Pippin, MD;  Location: [facility];  Service: Urology;  Laterality: Right;  . FLEXOR TENDON REPAIR Right    foot  . KIDNEY STONE SURGERY    . LEFT HEART CATH AND CORONARY ANGIOGRAPHY N/A 03/17/2018   Procedure: LEFT HEART CATH AND CORONARY ANGIOGRAPHY;  Surgeon: Kathleene Hazel, MD;  Location: [facility];  Service: Cardiovascular;  Laterality: N/A;     Allergies  Allergen Reactions  . Fruit Extracts Nausea And Vomiting    Bananas      Family History  Problem Relation Age of Onset  . Prostate cancer Father   . Diabetes Father      Social History Ms. Rundquist reports that she quit smoking about 19 months ago. Her smoking use included cigarettes. She has quit using smokeless tobacco. Ms. Helf reports that she drinks alcohol.   Review of Systems CONSTITUTIONAL: No weight loss, fever, chills, weakness or fatigue.  HEENT: Eyes: No visual loss, blurred vision, double vision or yellow sclerae.No hearing loss, sneezing, congestion, runny nose or sore throat.  SKIN: No rash or itching.  CARDIOVASCULAR: per hpi RESPIRATORY: No shortness of breath, cough or sputum.  GASTROINTESTINAL: No anorexia, nausea, vomiting or diarrhea. No abdominal pain or blood.  GENITOURINARY: No burning on urination, no polyuria NEUROLOGICAL: No headache, dizziness, syncope, paralysis, ataxia, numbness or tingling in the extremities. No change in bowel or bladder control.  MUSCULOSKELETAL: No muscle, back pain, joint pain or stiffness.  LYMPHATICS: No enlarged nodes. No history of splenectomy.  PSYCHIATRIC: No history of depression or anxiety.  ENDOCRINOLOGIC: No reports of sweating, cold or heat intolerance. No polyuria or polydipsia.  Marland Kitchen   Physical Examination Vitals:   07/09/18 0824  BP: 138/84  Pulse: 83  SpO2: 96%   Vitals:     07/09/18 0824  Weight: 234 lb 9.6 oz (106.4 kg)  Height: 5\' 4"  (1.626 m)    Gen: resting comfortably, no acute distress HEENT: no scleral icterus, pupils equal round and reactive, no palptable cervical adenopathy,  CV: RRR, no m/r/g, no vjd Resp: Clear to auscultation bilaterally GI: abdomen is soft, non-tender, non-distended, normal bowel sounds, no hepatosplenomegaly MSK: extremities are warm, no edema.  Skin: warm, no rash Neuro:  no focal deficits Psych: appropriate affect   Diagnostic Studies  03/2018 echo Study Conclusions  - Left ventricle: The cavity size was normal. Wall thickness was   increased in a pattern of mild  LVH. Systolic function was   moderately reduced. The estimated ejection fraction was in the   range of 35% to 40%. - Regional wall motion abnormality: Hypokinesis of the mid-apical   anterior, mid anteroseptal, mid inferolateral, mid anterolateral,   apical septal, apical lateral, and apical myocardium. Diastolic   function is abnormal, indeterminant grade. - Aortic valve: Valve area (VTI): 3.08 cm^2. Valve area (Vmax):   2.81 cm^2. - Atrial septum: No defect or patent foramen ovale was identified.   03/2018 cath  Prox RCA to Mid RCA lesion is 10% stenosed.  Mid Cx lesion is 20% stenosed.  Prox LAD to Mid LAD lesion is 40% stenosed.  There is mild to moderate left ventricular systolic dysfunction.  LV end diastolic pressure is normal.  The left ventricular ejection fraction is 35-45% by visual estimate.  There is no mitral valve regurgitation.   1. Mild non-obstructive CAD 2. Moderate LV systolic dysfunction with segmental wall motion abnormality involving the apex, anteroapical and inferoapical walls. This fits a pattern for a stress induced cardiomyopathy. (Takotsubo Cardiomyopathy).    05/2018 echo Study Conclusions  - Left ventricle: The cavity size was normal. Wall thickness was   increased in a pattern of moderate LVH. Systolic  function was   normal. The estimated ejection fraction was in the range of 60%   to 65%. Wall motion was normal; there were no regional wall   motion abnormalities. - Aortic valve: Mildly calcified annulus. Trileaflet; normal   thickness leaflets. Valve area (VTI): 2.07 cm^2. Valve area   (Vmax): 1.98 cm^2. Valve area (Vmean): 1.94 cm^2. - Mitral valve: Mildly calcified annulus. Normal thickness leaflets   . - Atrial septum: No defect or patent foramen ovale was identified. - Technically adequate study.   Assessment and Plan  1. History of stress induced CM/Takotsubo CM - occurred in setting of seizure - LV function has normalized. Would continue current medical therapy  2. CAD - mild CAD by cath. Continue risk factor modification. Would continue statin, ASA, statin as medical therapy.  3. HTN - manual recheck at goal 124/70, continue current meds  F/u 1 year   Antoine Poche, M.D.

## 2018-07-14 DIAGNOSIS — I10 Essential (primary) hypertension: Secondary | ICD-10-CM | POA: Diagnosis not present

## 2018-07-14 DIAGNOSIS — F909 Attention-deficit hyperactivity disorder, unspecified type: Secondary | ICD-10-CM | POA: Diagnosis not present

## 2018-07-14 DIAGNOSIS — M542 Cervicalgia: Secondary | ICD-10-CM | POA: Diagnosis not present

## 2018-07-14 DIAGNOSIS — N3281 Overactive bladder: Secondary | ICD-10-CM | POA: Diagnosis not present

## 2018-07-14 DIAGNOSIS — F331 Major depressive disorder, recurrent, moderate: Secondary | ICD-10-CM | POA: Diagnosis not present

## 2018-07-14 DIAGNOSIS — E119 Type 2 diabetes mellitus without complications: Secondary | ICD-10-CM | POA: Diagnosis not present

## 2018-07-14 DIAGNOSIS — J45909 Unspecified asthma, uncomplicated: Secondary | ICD-10-CM | POA: Diagnosis not present

## 2018-07-14 DIAGNOSIS — G4089 Other seizures: Secondary | ICD-10-CM | POA: Diagnosis not present

## 2018-07-14 DIAGNOSIS — E782 Mixed hyperlipidemia: Secondary | ICD-10-CM | POA: Diagnosis not present

## 2018-07-19 DIAGNOSIS — M5030 Other cervical disc degeneration, unspecified cervical region: Secondary | ICD-10-CM | POA: Insufficient documentation

## 2018-07-19 DIAGNOSIS — M542 Cervicalgia: Secondary | ICD-10-CM | POA: Insufficient documentation

## 2018-07-19 DIAGNOSIS — M859 Disorder of bone density and structure, unspecified: Secondary | ICD-10-CM | POA: Diagnosis not present

## 2018-07-19 DIAGNOSIS — M8580 Other specified disorders of bone density and structure, unspecified site: Secondary | ICD-10-CM | POA: Insufficient documentation

## 2018-07-26 DIAGNOSIS — M542 Cervicalgia: Secondary | ICD-10-CM | POA: Diagnosis not present

## 2018-10-20 DIAGNOSIS — M5136 Other intervertebral disc degeneration, lumbar region: Secondary | ICD-10-CM | POA: Diagnosis not present

## 2018-10-20 DIAGNOSIS — N200 Calculus of kidney: Secondary | ICD-10-CM | POA: Diagnosis not present

## 2018-10-20 DIAGNOSIS — F331 Major depressive disorder, recurrent, moderate: Secondary | ICD-10-CM | POA: Diagnosis not present

## 2018-10-20 DIAGNOSIS — E119 Type 2 diabetes mellitus without complications: Secondary | ICD-10-CM | POA: Diagnosis not present

## 2018-10-20 DIAGNOSIS — M542 Cervicalgia: Secondary | ICD-10-CM | POA: Diagnosis not present

## 2018-10-20 DIAGNOSIS — G40509 Epileptic seizures related to external causes, not intractable, without status epilepticus: Secondary | ICD-10-CM | POA: Diagnosis not present

## 2018-10-20 DIAGNOSIS — J45909 Unspecified asthma, uncomplicated: Secondary | ICD-10-CM | POA: Diagnosis not present

## 2018-10-20 DIAGNOSIS — E782 Mixed hyperlipidemia: Secondary | ICD-10-CM | POA: Diagnosis not present

## 2018-10-20 DIAGNOSIS — Z23 Encounter for immunization: Secondary | ICD-10-CM | POA: Diagnosis not present

## 2018-10-20 DIAGNOSIS — N3281 Overactive bladder: Secondary | ICD-10-CM | POA: Diagnosis not present

## 2018-10-20 DIAGNOSIS — I10 Essential (primary) hypertension: Secondary | ICD-10-CM | POA: Diagnosis not present

## 2018-11-29 DIAGNOSIS — Z6841 Body Mass Index (BMI) 40.0 and over, adult: Secondary | ICD-10-CM | POA: Diagnosis not present

## 2018-11-29 DIAGNOSIS — I10 Essential (primary) hypertension: Secondary | ICD-10-CM | POA: Diagnosis not present

## 2018-12-16 DIAGNOSIS — H353132 Nonexudative age-related macular degeneration, bilateral, intermediate dry stage: Secondary | ICD-10-CM | POA: Diagnosis not present

## 2018-12-28 DIAGNOSIS — L259 Unspecified contact dermatitis, unspecified cause: Secondary | ICD-10-CM | POA: Diagnosis not present

## 2018-12-28 DIAGNOSIS — I10 Essential (primary) hypertension: Secondary | ICD-10-CM | POA: Diagnosis not present

## 2019-02-25 DIAGNOSIS — R41840 Attention and concentration deficit: Secondary | ICD-10-CM | POA: Diagnosis not present

## 2019-02-25 DIAGNOSIS — I10 Essential (primary) hypertension: Secondary | ICD-10-CM | POA: Diagnosis not present

## 2019-02-25 DIAGNOSIS — M542 Cervicalgia: Secondary | ICD-10-CM | POA: Diagnosis not present

## 2019-05-24 ENCOUNTER — Ambulatory Visit: Payer: Medicaid Other | Admitting: Urology

## 2019-05-24 DIAGNOSIS — H524 Presbyopia: Secondary | ICD-10-CM | POA: Diagnosis not present

## 2019-05-27 DIAGNOSIS — E1140 Type 2 diabetes mellitus with diabetic neuropathy, unspecified: Secondary | ICD-10-CM | POA: Diagnosis not present

## 2019-05-27 DIAGNOSIS — E11621 Type 2 diabetes mellitus with foot ulcer: Secondary | ICD-10-CM | POA: Diagnosis not present

## 2019-05-27 DIAGNOSIS — I10 Essential (primary) hypertension: Secondary | ICD-10-CM | POA: Diagnosis not present

## 2019-05-27 DIAGNOSIS — E785 Hyperlipidemia, unspecified: Secondary | ICD-10-CM | POA: Diagnosis not present

## 2019-05-27 DIAGNOSIS — E782 Mixed hyperlipidemia: Secondary | ICD-10-CM | POA: Diagnosis not present

## 2019-06-01 DIAGNOSIS — N3281 Overactive bladder: Secondary | ICD-10-CM | POA: Diagnosis not present

## 2019-06-01 DIAGNOSIS — D72829 Elevated white blood cell count, unspecified: Secondary | ICD-10-CM | POA: Diagnosis not present

## 2019-06-01 DIAGNOSIS — M542 Cervicalgia: Secondary | ICD-10-CM | POA: Diagnosis not present

## 2019-06-01 DIAGNOSIS — F331 Major depressive disorder, recurrent, moderate: Secondary | ICD-10-CM | POA: Diagnosis not present

## 2019-06-01 DIAGNOSIS — R21 Rash and other nonspecific skin eruption: Secondary | ICD-10-CM | POA: Diagnosis not present

## 2019-06-01 DIAGNOSIS — F909 Attention-deficit hyperactivity disorder, unspecified type: Secondary | ICD-10-CM | POA: Diagnosis not present

## 2019-06-01 DIAGNOSIS — M79671 Pain in right foot: Secondary | ICD-10-CM | POA: Diagnosis not present

## 2019-06-01 DIAGNOSIS — G4089 Other seizures: Secondary | ICD-10-CM | POA: Diagnosis not present

## 2019-06-01 DIAGNOSIS — J45909 Unspecified asthma, uncomplicated: Secondary | ICD-10-CM | POA: Diagnosis not present

## 2019-06-01 DIAGNOSIS — E782 Mixed hyperlipidemia: Secondary | ICD-10-CM | POA: Diagnosis not present

## 2019-06-01 DIAGNOSIS — E1169 Type 2 diabetes mellitus with other specified complication: Secondary | ICD-10-CM | POA: Diagnosis not present

## 2019-06-01 DIAGNOSIS — I10 Essential (primary) hypertension: Secondary | ICD-10-CM | POA: Diagnosis not present

## 2019-06-28 ENCOUNTER — Ambulatory Visit: Payer: Medicaid Other | Admitting: Urology

## 2019-07-25 ENCOUNTER — Ambulatory Visit: Payer: Medicaid Other | Admitting: Cardiology

## 2019-07-25 NOTE — Progress Notes (Deleted)
Clinical Summary Savannah Pena is a 58 y.o.female  1. Acute systolic heart failure - admit 03/2018 with seizure, peak trop 1.64 - - echo with LVEF 35-40%, primarily apical WMAs.  - 03/2018 cath with mild nonobstructive disease - overall presentation consistent with stress induced CM  - 05/2018 echo LVEF 60-65%, no WMAs - no recent SOB/DOE/LE edema - compliant with meds   2. CAD - mild CAD by recent cath, nonobstructive - recent atypical chest pain associated with stress    Past Medical History:  Diagnosis Date  . Anxiety   . Arthritis   . Asthma   . Attention deficit disorder (ADD)   . COPD (chronic obstructive pulmonary disease) (HCC)   . Diabetes mellitus without complication (HCC)   . Dyspnea    with exertion  . GERD (gastroesophageal reflux disease)   . History of kidney stones   . Hypertension   . Major depression   . Seizures (HCC)    last seizure 23 years ago  . Sleep apnea    has C-PAP, but does not currently use  . Takotsubo cardiomyopathy    a. EF 35-40% by echo in 03/2018 with cath showing mild, nonobstructive CAD.      Allergies  Allergen Reactions  . Fruit Extracts Nausea And Vomiting    Bananas     Current Outpatient Medications  Medication Sig Dispense Refill  . ADDERALL XR 15 MG 24 hr capsule Take by mouth daily.  0  . albuterol (PROVENTIL) (2.5 MG/3ML) 0.083% nebulizer solution USE 1 VIAL IN NEBULIZER 4 TIMES A DAY AS NEEDED  5  . aspirin EC 81 MG EC tablet Take 1 tablet (81 mg total) by mouth daily.    Marland Kitchen. atorvastatin (LIPITOR) 40 MG tablet Take 1 tablet (40 mg total) by mouth daily at 6 PM. 90 tablet 3  . buPROPion (WELLBUTRIN XL) 150 MG 24 hr tablet Take 150 mg by mouth daily.  0  . cetirizine (ZYRTEC) 10 MG tablet Take 10 mg by mouth daily.    . D3-50 50000 units capsule Take 50,000 Units by mouth once a week.  1  . diclofenac (VOLTAREN) 50 MG EC tablet Take 50 mg by mouth 2 (two) times daily.  5  . DULoxetine (CYMBALTA) 30 MG  capsule Take 3 capsules (90 mg total) by mouth daily. 90 capsule 0  . DULoxetine (CYMBALTA) 60 MG capsule Take by mouth daily.  0  . FLOVENT HFA 220 MCG/ACT inhaler INHALE BY MOUTH TWO TIMES A DAY  5  . fluticasone (FLOVENT DISKUS) 50 MCG/BLIST diskus inhaler Inhale 1 puff into the lungs 2 (two) times daily.    Marland Kitchen. gabapentin (NEURONTIN) 300 MG capsule Take 300 mg by mouth 2 (two) times daily.  5  . lisinopril (PRINIVIL,ZESTRIL) 10 MG tablet Take 1 tablet (10 mg total) by mouth daily. 90 tablet 3  . metFORMIN (GLUCOPHAGE) 500 MG tablet Take 1 tablet (500 mg total) by mouth 2 (two) times daily with a meal. Start 03/20/2018 60 tablet 0  . metoprolol succinate (TOPROL XL) 25 MG 24 hr tablet Take 1 tablet (25 mg total) by mouth daily. 90 tablet 3  . montelukast (SINGULAIR) 10 MG tablet Take 10 mg by mouth daily.    Marland Kitchen. omeprazole (PRILOSEC) 40 MG capsule Take 40 mg by mouth daily.    Marland Kitchen. oxybutynin (DITROPAN) 5 MG tablet Take 1 tablet (5 mg total) by mouth every 8 (eight) hours as needed for bladder spasms. And stent discomfort.  90 tablet 1  . phenytoin (DILANTIN) 200 MG ER capsule Take 1 capsule (200 mg total) by mouth 2 (two) times daily. 60 capsule 0  . PROAIR HFA 108 (90 Base) MCG/ACT inhaler 1-2 PUFFS EVERY 4-6 HOURS AS NEEDED FOR SHORTNESS OF BREATH  3  . terbinafine (LAMISIL) 250 MG tablet Take 250 mg by mouth daily.  0  . Tiotropium Bromide-Olodaterol (STIOLTO RESPIMAT) 2.5-2.5 MCG/ACT AERS Inhale into the lungs.    . Vitamin D, Ergocalciferol, (DRISDOL) 50000 units CAPS capsule Take 50,000 Units by mouth every 7 (seven) days.     No current facility-administered medications for this visit.      Past Surgical History:  Procedure Laterality Date  . CESAREAN SECTION    . CYSTOSCOPY WITH RETROGRADE PYELOGRAM, URETEROSCOPY AND STENT PLACEMENT Right 12/05/2016   Procedure: CYSTOSCOPY WITH RETROGRADE PYELOGRAM, URETEROSCOPY AND STENT PLACEMENT;  Surgeon: Sebastian Ache, MD;  Location: [facility];   Service: Urology;  Laterality: Right;  . CYSTOSCOPY WITH STENT PLACEMENT Right 11/06/2016   Procedure: CYSTOSCOPY WITH STENT PLACEMENT;  Surgeon: Bjorn Pippin, MD;  Location: [facility];  Service: Urology;  Laterality: Right;  . FLEXOR TENDON REPAIR Right    foot  . KIDNEY STONE SURGERY    . LEFT HEART CATH AND CORONARY ANGIOGRAPHY N/A 03/17/2018   Procedure: LEFT HEART CATH AND CORONARY ANGIOGRAPHY;  Surgeon: Kathleene Hazel, MD;  Location: [facility];  Service: Cardiovascular;  Laterality: N/A;     Allergies  Allergen Reactions  . Fruit Extracts Nausea And Vomiting    Bananas      Family History  Problem Relation Age of Onset  . Prostate cancer Father   . Diabetes Father      Social History Savannah Pena reports that she quit smoking about 2 years ago. Her smoking use included cigarettes. She has quit using smokeless tobacco. Savannah Pena reports current alcohol use.   Review of Systems CONSTITUTIONAL: No weight loss, fever, chills, weakness or fatigue.  HEENT: Eyes: No visual loss, blurred vision, double vision or yellow sclerae.No hearing loss, sneezing, congestion, runny nose or sore throat.  SKIN: No rash or itching.  CARDIOVASCULAR:  RESPIRATORY: No shortness of breath, cough or sputum.  GASTROINTESTINAL: No anorexia, nausea, vomiting or diarrhea. No abdominal pain or blood.  GENITOURINARY: No burning on urination, no polyuria NEUROLOGICAL: No headache, dizziness, syncope, paralysis, ataxia, numbness or tingling in the extremities. No change in bowel or bladder control.  MUSCULOSKELETAL: No muscle, back pain, joint pain or stiffness.  LYMPHATICS: No enlarged nodes. No history of splenectomy.  PSYCHIATRIC: No history of depression or anxiety.  ENDOCRINOLOGIC: No reports of sweating, cold or heat intolerance. No polyuria or polydipsia.  Marland Kitchen   Physical Examination There were no vitals filed for this visit. There were no vitals filed for this visit.  Gen:  resting comfortably, no acute distress HEENT: no scleral icterus, pupils equal round and reactive, no palptable cervical adenopathy,  CV Resp: Clear to auscultation bilaterally GI: abdomen is soft, non-tender, non-distended, normal bowel sounds, no hepatosplenomegaly MSK: extremities are warm, no edema.  Skin: warm, no rash Neuro:  no focal deficits Psych: appropriate affect   Diagnostic Studies  03/2018 echo Study Conclusions  - Left ventricle: The cavity size was normal. Wall thickness was increased in a pattern of mild LVH. Systolic function was moderately reduced. The estimated ejection fraction was in the range of 35% to 40%. - Regional wall motion abnormality: Hypokinesis of the mid-apical anterior, mid anteroseptal, mid inferolateral,  mid anterolateral, apical septal, apical lateral, and apical myocardium. Diastolic function is abnormal, indeterminant grade. - Aortic valve: Valve area (VTI): 3.08 cm^2. Valve area (Vmax): 2.81 cm^2. - Atrial septum: No defect or patent foramen ovale was identified.   03/2018 cath  Prox RCA to Mid RCA lesion is 10% stenosed.  Mid Cx lesion is 20% stenosed.  Prox LAD to Mid LAD lesion is 40% stenosed.  There is mild to moderate left ventricular systolic dysfunction.  LV end diastolic pressure is normal.  The left ventricular ejection fraction is 35-45% by visual estimate.  There is no mitral valve regurgitation.  1. Mild non-obstructive CAD 2. Moderate LV systolic dysfunction with segmental wall motion abnormality involving the apex, anteroapical and inferoapical walls. This fits a pattern for a stress induced cardiomyopathy. (Takotsubo Cardiomyopathy).    05/2018 echo Study Conclusions  - Left ventricle: The cavity size was normal. Wall thickness was increased in a pattern of moderate LVH. Systolic function was normal. The estimated ejection fraction was in the range of 60% to 65%. Wall motion was  normal; there were no regional wall motion abnormalities. - Aortic valve: Mildly calcified annulus. Trileaflet; normal thickness leaflets. Valve area (VTI): 2.07 cm^2. Valve area (Vmax): 1.98 cm^2. Valve area (Vmean): 1.94 cm^2. - Mitral valve: Mildly calcified annulus. Normal thickness leaflets . - Atrial septum: No defect or patent foramen ovale was identified. - Technically adequate study.   Assessment and Plan   1. History of stress induced CM/Takotsubo CM - occurred in setting of seizure - LV function has normalized. Would continue current medical therapy  2. CAD - mild CAD by cath. Continue risk factor modification. Would continue statin, ASA, statin as medical therapy.  3. HTN - manual recheck at goal 124/70, continue current meds  F/u 1 year     Arnoldo Lenis, M.D., F.A.C.C.

## 2019-07-26 ENCOUNTER — Encounter: Payer: Self-pay | Admitting: Cardiology

## 2019-09-05 DIAGNOSIS — L858 Other specified epidermal thickening: Secondary | ICD-10-CM | POA: Diagnosis not present

## 2019-09-05 DIAGNOSIS — L920 Granuloma annulare: Secondary | ICD-10-CM | POA: Diagnosis not present

## 2019-09-05 DIAGNOSIS — L689 Hypertrichosis, unspecified: Secondary | ICD-10-CM | POA: Diagnosis not present

## 2019-09-13 ENCOUNTER — Other Ambulatory Visit: Payer: Self-pay | Admitting: *Deleted

## 2019-09-13 DIAGNOSIS — Z87442 Personal history of urinary calculi: Secondary | ICD-10-CM

## 2019-09-14 ENCOUNTER — Ambulatory Visit (INDEPENDENT_AMBULATORY_CARE_PROVIDER_SITE_OTHER): Payer: Medicaid Other | Admitting: Urology

## 2019-09-14 ENCOUNTER — Other Ambulatory Visit: Payer: Self-pay

## 2019-09-14 ENCOUNTER — Encounter: Payer: Self-pay | Admitting: Urology

## 2019-09-14 ENCOUNTER — Ambulatory Visit
Admission: RE | Admit: 2019-09-14 | Discharge: 2019-09-14 | Disposition: A | Discharge status: Home / Self Care | Payer: Medicaid Other | Source: Ambulatory Visit | Attending: Urology | Admitting: Urology

## 2019-09-14 VITALS — BP 123/82 | HR 88 | Ht 64.0 in | Wt 234.0 lb

## 2019-09-14 DIAGNOSIS — Z87442 Personal history of urinary calculi: Secondary | ICD-10-CM | POA: Insufficient documentation

## 2019-09-14 DIAGNOSIS — R8281 Pyuria: Secondary | ICD-10-CM

## 2019-09-14 DIAGNOSIS — R350 Frequency of micturition: Secondary | ICD-10-CM

## 2019-09-14 DIAGNOSIS — N200 Calculus of kidney: Secondary | ICD-10-CM | POA: Diagnosis not present

## 2019-09-14 NOTE — Progress Notes (Signed)
09/14/2019 2:55 PM   [place]ena 02/14/1961 [id number]  Referring provider: Benita Stabile, MD [address] [place],  [place] [address]  Chief Complaint  Patient presents with  . Urinary Frequency  . Nephrolithiasis    Urologic problem list:  1 - Recurrent Nephrolithiasis-  Pre 2018 - SWL x2, cystolithalopexy x1. 11/2016 - medical passage 65mm ureteral stone after stenting (ureteroscopy after confirms stone free).   2 - Medical Stone Disease/ Hyperoxaluria / Hypocitraturia-  Eval 2018: BMP (low-normal Ca 8.4), PTH, Urate - normal; Composition - pending; 24 hr urines -mild high oxalate (40) and low citrate   HPI: 58 y.o. female presents for annual follow-up.  Renal ultrasound July 2019 showed no recurrent calculi.  Since her visit last year she denies flank, abdominal or pelvic pain.  No gross hematuria.  She has noted increased urinary frequency, urgency with occasional episodes of urge incontinence.  She also has nocturia x2-5.  She is taking a immediate release oxybutynin which helps her voiding pattern.  Her nighttime voided volumes are estimated at average.  She does have sleep apnea however does not use her CPAP because she states she does not have proper cleaning supplies.   PMH: Past Medical History:  Diagnosis Date  . Anxiety   . Arthritis   . Asthma   . Attention deficit disorder (ADD)   . COPD (chronic obstructive pulmonary disease) (HCC)   . Diabetes mellitus without complication (HCC)   . Dyspnea    with exertion  . GERD (gastroesophageal reflux disease)   . History of kidney stones   . Hypertension   . Kidney stone   . Major depression   . Seizures (HCC)    last seizure 23 years ago  . Sleep apnea    has C-PAP, but does not currently use  . Takotsubo cardiomyopathy    a. EF 35-40% by echo in 03/2018 with cath showing mild, nonobstructive CAD.     Surgical History: Past Surgical History:  Procedure Laterality Date  . CESAREAN SECTION    .  CYSTOSCOPY WITH RETROGRADE PYELOGRAM, URETEROSCOPY AND STENT PLACEMENT Right 12/05/2016   Procedure: CYSTOSCOPY WITH RETROGRADE PYELOGRAM, URETEROSCOPY AND STENT PLACEMENT;  Surgeon: Sebastian Ache, MD;  Location: [facility];  Service: Urology;  Laterality: Right;  . CYSTOSCOPY WITH STENT PLACEMENT Right 11/06/2016   Procedure: CYSTOSCOPY WITH STENT PLACEMENT;  Surgeon: Bjorn Pippin, MD;  Location: [facility];  Service: Urology;  Laterality: Right;  . FLEXOR TENDON REPAIR Right    foot  . KIDNEY STONE SURGERY    . LEFT HEART CATH AND CORONARY ANGIOGRAPHY N/A 03/17/2018   Procedure: LEFT HEART CATH AND CORONARY ANGIOGRAPHY;  Surgeon: Kathleene Hazel, MD;  Location: [facility];  Service: Cardiovascular;  Laterality: N/A;    Home Medications:  Allergies as of 09/14/2019      Reactions   Fruit Extracts Nausea And Vomiting   Bananas      Medication List       Accurate as of September 14, 2019  2:55 PM. If you have any questions, ask your nurse or doctor.        STOP taking these medications   Flovent HFA 220 MCG/ACT inhaler Generic drug: fluticasone Stopped by: Riki Altes, MD   fluticasone 50 MCG/BLIST diskus inhaler Commonly known as: FLOVENT DISKUS Stopped by: Riki Altes, MD   terbinafine 250 MG tablet Commonly known as: LAMISIL Stopped by: Riki Altes, MD     TAKE  these medications   Adderall XR 30 MG 24 hr capsule Generic drug: amphetamine-dextroamphetamine What changed: Another medication with the same name was removed. Continue taking this medication, and follow the directions you see here. Changed by: Riki Altes, MD   aspirin 81 MG EC tablet Take 1 tablet (81 mg total) by mouth daily.   atorvastatin 40 MG tablet Commonly known as: LIPITOR Take 1 tablet (40 mg total) by mouth daily at 6 PM.   buPROPion 150 MG 24 hr tablet Commonly known as: WELLBUTRIN XL Take 150 mg by mouth daily.   cetirizine 10 MG tablet Commonly known as: ZYRTEC  Take 10 mg by mouth daily.   D3-50 1.25 MG (50000 UT) capsule Generic drug: Cholecalciferol Take 50,000 Units by mouth once a week.   diclofenac 50 MG EC tablet Commonly known as: VOLTAREN Take 50 mg by mouth 2 (two) times daily.   DULoxetine 60 MG capsule Commonly known as: CYMBALTA Take 60 mg by mouth daily. What changed: Another medication with the same name was removed. Continue taking this medication, and follow the directions you see here. Changed by: Riki Altes, MD   gabapentin 300 MG capsule Commonly known as: NEURONTIN Take 300 mg by mouth 2 (two) times daily.   lisinopril 10 MG tablet Commonly known as: ZESTRIL Take 1 tablet (10 mg total) by mouth daily.   metFORMIN 500 MG tablet Commonly known as: GLUCOPHAGE Take 1 tablet (500 mg total) by mouth 2 (two) times daily with a meal. Start 03/20/2018   methocarbamol 750 MG tablet Commonly known as: ROBAXIN   metoprolol succinate 25 MG 24 hr tablet Commonly known as: Toprol XL Take 1 tablet (25 mg total) by mouth daily.   montelukast 10 MG tablet Commonly known as: SINGULAIR Take 10 mg by mouth daily.   nystatin ointment Commonly known as: MYCOSTATIN nystatin 100,000 unit/gram topical ointment  APPLY TO AFFECTED AREA 3 TIMES A DAY   Omega 3 1000 MG Caps Take by mouth 3 (three) times daily.   omeprazole 40 MG capsule Commonly known as: PRILOSEC Take 40 mg by mouth daily.   oxybutynin 5 MG tablet Commonly known as: DITROPAN Take 1 tablet (5 mg total) by mouth every 8 (eight) hours as needed for bladder spasms. And stent discomfort.   phentermine 37.5 MG tablet Commonly known as: ADIPEX-P   phenytoin 200 MG ER capsule Commonly known as: DILANTIN Take 1 capsule (200 mg total) by mouth 2 (two) times daily.   PRESERVISION AREDS 2 PO Take by mouth 2 (two) times daily.   ProAir HFA 108 (90 Base) MCG/ACT inhaler Generic drug: albuterol 1-2 PUFFS EVERY 4-6 HOURS AS NEEDED FOR SHORTNESS OF BREATH What  changed: Another medication with the same name was removed. Continue taking this medication, and follow the directions you see here. Changed by: Riki Altes, MD   Stiolto Respimat 2.5-2.5 MCG/ACT Aers Generic drug: Tiotropium Bromide-Olodaterol Inhale into the lungs.   Vitamin D (Ergocalciferol) 1.25 MG (50000 UT) Caps capsule Commonly known as: DRISDOL Take 50,000 Units by mouth every 7 (seven) days.       Allergies:  Allergies  Allergen Reactions  . Fruit Extracts Nausea And Vomiting    Bananas    Family History: Family History  Problem Relation Age of Onset  . Prostate cancer Father   . Diabetes Father     Social History:  reports that she quit smoking about 2 years ago. Her smoking use included cigarettes. She has quit using  smokeless tobacco. She reports current alcohol use. She reports that she does not use drugs.  ROS: UROLOGY Frequent Urination?: No Hard to postpone urination?: No Burning/pain with urination?: No Get up at night to urinate?: Yes Leakage of urine?: Yes Urine stream starts and stops?: No Trouble starting stream?: No Do you have to strain to urinate?: No Blood in urine?: No Urinary tract infection?: No Sexually transmitted disease?: No Injury to kidneys or bladder?: No Painful intercourse?: No Weak stream?: No Currently pregnant?: No Vaginal bleeding?: No Last menstrual period?: N/A  Gastrointestinal Nausea?: No Vomiting?: No Indigestion/heartburn?: Yes Diarrhea?: No Constipation?: No  Constitutional Fever: No Night sweats?: No Weight loss?: No Fatigue?: Yes  Skin Skin rash/lesions?: No Itching?: No  Eyes Blurred vision?: No Double vision?: No  Ears/Nose/Throat Sore throat?: No Sinus problems?: No  Hematologic/Lymphatic Swollen glands?: No Easy bruising?: No  Cardiovascular Leg swelling?: No Chest pain?: No  Respiratory Cough?: No Shortness of breath?: No  Endocrine Excessive thirst?: No   Musculoskeletal Back pain?: Yes Joint pain?: Yes  Neurological Headaches?: No Dizziness?: No  Psychologic Depression?: Yes Anxiety?: Yes  Physical Exam: BP 123/82 (BP Location: Left Arm, Patient Position: Sitting, Cuff Size: Normal)   Pulse 88   Ht 5\' 4"  (1.626 m)   Wt 234 lb (106.1 kg)   LMP 12/06/2011 (Approximate)   BMI 40.17 kg/m   Constitutional:  Alert and oriented, No acute distress. HEENT: [place] AT, moist mucus membranes.  Trachea midline, no masses. Cardiovascular: No clubbing, cyanosis, or edema. Respiratory: Normal respiratory effort, no increased work of breathing. GI: Abdomen is soft, nontender, nondistended, no abdominal masses GU: No CVA tenderness Lymph: No cervical or inguinal lymphadenopathy. Skin: No rashes, bruises or suspicious lesions. Neurologic: Grossly intact, no focal deficits, moving all 4 extremities. Psychiatric: Normal mood and affect.  Laboratory Data:  Urinalysis Dipstick 1+ leukocytes, nitrite positive Microscopy 11-30 WBC, many bacteria   Assessment & Plan:    - History urinary tract calculi She was sent today for KUB and will be notified with the results.  Continue annual follow-up.  - Lower urinary tract symptoms Storage related voiding symptoms. Some improvement on immediate release oxybutynin.  Will add Myrbetriq 25 mg.  Given 4 weeks of samples.  Urinalysis today does show positive leukocytes and nitrites with 11-30 WBCs.  Urine culture was ordered.  - Nocturia We discussed that OSA is a common cause of nocturia and recommend she use her CPAP if possible.   Abbie Sons, [place] [address] [place], [place] [address] [phone]

## 2019-09-15 ENCOUNTER — Encounter: Payer: Self-pay | Admitting: Urology

## 2019-09-15 ENCOUNTER — Telehealth: Payer: Self-pay

## 2019-09-15 LAB — URINALYSIS, COMPLETE
Bilirubin, UA: NEGATIVE
Glucose, UA: NEGATIVE
Ketones, UA: NEGATIVE
Nitrite, UA: POSITIVE — AB
Protein,UA: NEGATIVE
RBC, UA: NEGATIVE
Specific Gravity, UA: 1.015 (ref 1.005–1.030)
Urobilinogen, Ur: 0.2 mg/dL (ref 0.2–1.0)
pH, UA: 5.5 (ref 5.0–7.5)

## 2019-09-15 LAB — MICROSCOPIC EXAMINATION: RBC, Urine: NONE SEEN /hpf (ref 0–2)

## 2019-09-15 NOTE — Telephone Encounter (Signed)
See my chart message

## 2019-09-15 NOTE — Telephone Encounter (Signed)
-----   Message from Abbie Sons, MD sent at 09/15/2019  9:55 AM EST ----- Urinalysis looks like she may have an infection.  Culture is pending.

## 2019-09-17 LAB — CULTURE, URINE COMPREHENSIVE

## 2019-09-19 ENCOUNTER — Encounter: Payer: Self-pay | Admitting: Urology

## 2019-09-19 ENCOUNTER — Telehealth: Payer: Self-pay

## 2019-09-19 MED ORDER — SULFAMETHOXAZOLE-TRIMETHOPRIM 800-160 MG PO TABS: 1.0000 | ORAL_TABLET | Freq: Two times a day (BID) | ORAL | 0 refills | Status: DC

## 2019-09-19 NOTE — Telephone Encounter (Signed)
Urine culture positive for infection.  Rx Septra sent to pharmacy.  Treatment may help her irritative voiding symptoms.

## 2019-09-19 NOTE — Telephone Encounter (Signed)
Called pt no answer. Left detailed message for pt per DPR informing her of imaging results and urine cx. Advised pt to call back for questions or concerns.

## 2019-09-19 NOTE — Telephone Encounter (Signed)
-----   Message from Abbie Sons, MD sent at 09/16/2019  3:34 PM EST ----- No definite calculi seen on CT

## 2019-09-26 ENCOUNTER — Encounter: Payer: Self-pay | Admitting: Cardiology

## 2019-09-26 ENCOUNTER — Other Ambulatory Visit: Payer: Self-pay

## 2019-09-26 ENCOUNTER — Ambulatory Visit (INDEPENDENT_AMBULATORY_CARE_PROVIDER_SITE_OTHER): Payer: Medicaid Other | Admitting: Cardiology

## 2019-09-26 VITALS — BP 133/79 | HR 99 | Temp 96.9°F | Ht 64.0 in | Wt 234.0 lb

## 2019-09-26 DIAGNOSIS — I10 Essential (primary) hypertension: Secondary | ICD-10-CM | POA: Diagnosis not present

## 2019-09-26 DIAGNOSIS — I2510 Atherosclerotic heart disease of native coronary artery without angina pectoris: Secondary | ICD-10-CM

## 2019-09-26 DIAGNOSIS — I5181 Takotsubo syndrome: Secondary | ICD-10-CM

## 2019-09-26 NOTE — Progress Notes (Signed)
Clinical Summary Ms. Deforest is a 58 y.o.female seen today for follow up of the following medical problems.   1. Acute systolic heart failure - admit 03/2018 with seizure, peak trop 1.64 - - echo with LVEF 35-40%, primarily apical WMAs.  - 03/2018 cath with mild nonobstructive disease - overall presentation consistent with stress induced CM  - 05/2018 echo LVEF 60-65%, no WMAs  - chronic SOB she attritbutes with asthma. No chest pain. No LE edema     2. CAD - mild CAD by 03/2018 cath, nonobstructive - no recent symptoms  - she is on ASA and statin.     SH: sister diagnosed with covid, currently in hospital but getting better    Past Medical History:  Diagnosis Date  . Anxiety   . Arthritis   . Asthma   . Attention deficit disorder (ADD)   . COPD (chronic obstructive pulmonary disease) (HCC)   . Diabetes mellitus without complication (HCC)   . Dyspnea    with exertion  . GERD (gastroesophageal reflux disease)   . History of kidney stones   . Hypertension   . Kidney stone   . Major depression   . Seizures (HCC)    last seizure 23 years ago  . Sleep apnea    has C-PAP, but does not currently use  . Takotsubo cardiomyopathy    a. EF 35-40% by echo in 03/2018 with cath showing mild, nonobstructive CAD.      Allergies  Allergen Reactions  . Fruit Extracts Nausea And Vomiting    Bananas     Current Outpatient Medications  Medication Sig Dispense Refill  . ADDERALL XR 30 MG 24 hr capsule     . aspirin EC 81 MG EC tablet Take 1 tablet (81 mg total) by mouth daily.    Marland Kitchen atorvastatin (LIPITOR) 40 MG tablet Take 1 tablet (40 mg total) by mouth daily at 6 PM. 90 tablet 3  . buPROPion (WELLBUTRIN XL) 150 MG 24 hr tablet Take 150 mg by mouth daily.  0  . cetirizine (ZYRTEC) 10 MG tablet Take 10 mg by mouth daily.    . D3-50 50000 units capsule Take 50,000 Units by mouth once a week.  1  . diclofenac (VOLTAREN) 50 MG EC tablet Take 50 mg by mouth 2 (two) times  daily.  5  . DULoxetine (CYMBALTA) 60 MG capsule Take 60 mg by mouth daily.    Marland Kitchen gabapentin (NEURONTIN) 300 MG capsule Take 300 mg by mouth 2 (two) times daily.  5  . lisinopril (PRINIVIL,ZESTRIL) 10 MG tablet Take 1 tablet (10 mg total) by mouth daily. 90 tablet 3  . metFORMIN (GLUCOPHAGE) 500 MG tablet Take 1 tablet (500 mg total) by mouth 2 (two) times daily with a meal. Start 03/20/2018 60 tablet 0  . methocarbamol (ROBAXIN) 750 MG tablet     . metoprolol succinate (TOPROL XL) 25 MG 24 hr tablet Take 1 tablet (25 mg total) by mouth daily. 90 tablet 3  . montelukast (SINGULAIR) 10 MG tablet Take 10 mg by mouth daily.    . Multiple Vitamins-Minerals (PRESERVISION AREDS 2 PO) Take by mouth 2 (two) times daily.    Marland Kitchen nystatin ointment (MYCOSTATIN) nystatin 100,000 unit/gram topical ointment  APPLY TO AFFECTED AREA 3 TIMES A DAY    . Omega 3 1000 MG CAPS Take by mouth 3 (three) times daily.    Marland Kitchen omeprazole (PRILOSEC) 40 MG capsule Take 40 mg by mouth daily.    Marland Kitchen  oxybutynin (DITROPAN) 5 MG tablet Take 1 tablet (5 mg total) by mouth every 8 (eight) hours as needed for bladder spasms. And stent discomfort. 90 tablet 1  . phentermine (ADIPEX-P) 37.5 MG tablet     . phenytoin (DILANTIN) 200 MG ER capsule Take 1 capsule (200 mg total) by mouth 2 (two) times daily. 60 capsule 0  . PROAIR HFA 108 (90 Base) MCG/ACT inhaler 1-2 PUFFS EVERY 4-6 HOURS AS NEEDED FOR SHORTNESS OF BREATH  3  . sulfamethoxazole-trimethoprim (BACTRIM DS) 800-160 MG tablet Take 1 tablet by mouth every 12 (twelve) hours. 14 tablet 0  . Tiotropium Bromide-Olodaterol (STIOLTO RESPIMAT) 2.5-2.5 MCG/ACT AERS Inhale into the lungs.    . Vitamin D, Ergocalciferol, (DRISDOL) 50000 units CAPS capsule Take 50,000 Units by mouth every 7 (seven) days.     No current facility-administered medications for this visit.      Past Surgical History:  Procedure Laterality Date  . CESAREAN SECTION    . CYSTOSCOPY WITH RETROGRADE PYELOGRAM,  URETEROSCOPY AND STENT PLACEMENT Right 12/05/2016   Procedure: CYSTOSCOPY WITH RETROGRADE PYELOGRAM, URETEROSCOPY AND STENT PLACEMENT;  Surgeon: Sebastian Ache, MD;  Location: [facility];  Service: Urology;  Laterality: Right;  . CYSTOSCOPY WITH STENT PLACEMENT Right 11/06/2016   Procedure: CYSTOSCOPY WITH STENT PLACEMENT;  Surgeon: Bjorn Pippin, MD;  Location: [facility];  Service: Urology;  Laterality: Right;  . FLEXOR TENDON REPAIR Right    foot  . KIDNEY STONE SURGERY    . LEFT HEART CATH AND CORONARY ANGIOGRAPHY N/A 03/17/2018   Procedure: LEFT HEART CATH AND CORONARY ANGIOGRAPHY;  Surgeon: Kathleene Hazel, MD;  Location: [facility];  Service: Cardiovascular;  Laterality: N/A;     Allergies  Allergen Reactions  . Fruit Extracts Nausea And Vomiting    Bananas      Family History  Problem Relation Age of Onset  . Prostate cancer Father   . Diabetes Father      Social History Ms. Bannister reports that she quit smoking about 2 years ago. Her smoking use included cigarettes. She has quit using smokeless tobacco. Ms. Brokaw reports current alcohol use.   Review of Systems CONSTITUTIONAL: No weight loss, fever, chills, weakness or fatigue.  HEENT: Eyes: No visual loss, blurred vision, double vision or yellow sclerae.No hearing loss, sneezing, congestion, runny nose or sore throat.  SKIN: No rash or itching.  CARDIOVASCULAR: per hpi RESPIRATORY: No shortness of breath, cough or sputum.  GASTROINTESTINAL: No anorexia, nausea, vomiting or diarrhea. No abdominal pain or blood.  GENITOURINARY: No burning on urination, no polyuria NEUROLOGICAL: No headache, dizziness, syncope, paralysis, ataxia, numbness or tingling in the extremities. No change in bowel or bladder control.  MUSCULOSKELETAL: No muscle, back pain, joint pain or stiffness.  LYMPHATICS: No enlarged nodes. No history of splenectomy.  PSYCHIATRIC: No history of depression or anxiety.  ENDOCRINOLOGIC: No reports  of sweating, cold or heat intolerance. No polyuria or polydipsia.  Marland Kitchen   Physical Examination Today's Vitals   09/26/19 1432  BP: 133/79  Pulse: 99  Temp: (!) 96.9 F (36.1 C)  TempSrc: Temporal  SpO2: 98%  Weight: 234 lb (106.1 kg)  Height: 5\' 4"  (1.626 m)   Body mass index is 40.17 kg/m.  Gen: resting comfortably, no acute distress HEENT: no scleral icterus, pupils equal round and reactive, no palptable cervical adenopathy,  CV: RRR, no m/r/g, no jvd Resp: Clear to auscultation bilaterally GI: abdomen is soft, non-tender, non-distended, normal bowel sounds, no hepatosplenomegaly MSK: extremities are  warm, no edema.  Skin: warm, no rash Neuro:  no focal deficits Psych: appropriate affect   Diagnostic Studies 03/2018 echo Study Conclusions  - Left ventricle: The cavity size was normal. Wall thickness was increased in a pattern of mild LVH. Systolic function was moderately reduced. The estimated ejection fraction was in the range of 35% to 40%. - Regional wall motion abnormality: Hypokinesis of the mid-apical anterior, mid anteroseptal, mid inferolateral, mid anterolateral, apical septal, apical lateral, and apical myocardium. Diastolic function is abnormal, indeterminant grade. - Aortic valve: Valve area (VTI): 3.08 cm^2. Valve area (Vmax): 2.81 cm^2. - Atrial septum: No defect or patent foramen ovale was identified.   03/2018 cath  Prox RCA to Mid RCA lesion is 10% stenosed.  Mid Cx lesion is 20% stenosed.  Prox LAD to Mid LAD lesion is 40% stenosed.  There is mild to moderate left ventricular systolic dysfunction.  LV end diastolic pressure is normal.  The left ventricular ejection fraction is 35-45% by visual estimate.  There is no mitral valve regurgitation.  1. Mild non-obstructive CAD 2. Moderate LV systolic dysfunction with segmental wall motion abnormality involving the apex, anteroapical and inferoapical walls. This fits a pattern  for a stress induced cardiomyopathy. (Takotsubo Cardiomyopathy).    05/2018 echo Study Conclusions  - Left ventricle: The cavity size was normal. Wall thickness was increased in a pattern of moderate LVH. Systolic function was normal. The estimated ejection fraction was in the range of 60% to 65%. Wall motion was normal; there were no regional wall motion abnormalities. - Aortic valve: Mildly calcified annulus. Trileaflet; normal thickness leaflets. Valve area (VTI): 2.07 cm^2. Valve area (Vmax): 1.98 cm^2. Valve area (Vmean): 1.94 cm^2. - Mitral valve: Mildly calcified annulus. Normal thickness leaflets . - Atrial septum: No defect or patent foramen ovale was identified. - Technically adequate study.     Assessment and Plan  1. History of stress induced CM/Takotsubo CM - occurred in setting of seizure - LV function has normalized. - no recent symptoms, continue to monitor.  2. CAD - mild CAD by cath.  - no symptoms, continue current meds  3. HTN - she is at goal, continue current meds   F/u 1 year       Arnoldo Lenis, M.D.

## 2019-09-26 NOTE — Patient Instructions (Signed)

## 2019-10-27 DIAGNOSIS — E11621 Type 2 diabetes mellitus with foot ulcer: Secondary | ICD-10-CM | POA: Diagnosis not present

## 2019-10-27 DIAGNOSIS — E782 Mixed hyperlipidemia: Secondary | ICD-10-CM | POA: Diagnosis not present

## 2019-10-27 DIAGNOSIS — E785 Hyperlipidemia, unspecified: Secondary | ICD-10-CM | POA: Diagnosis not present

## 2019-10-27 DIAGNOSIS — E1140 Type 2 diabetes mellitus with diabetic neuropathy, unspecified: Secondary | ICD-10-CM | POA: Diagnosis not present

## 2019-10-27 DIAGNOSIS — E1169 Type 2 diabetes mellitus with other specified complication: Secondary | ICD-10-CM | POA: Diagnosis not present

## 2019-10-27 DIAGNOSIS — I10 Essential (primary) hypertension: Secondary | ICD-10-CM | POA: Diagnosis not present

## 2019-10-27 DIAGNOSIS — E119 Type 2 diabetes mellitus without complications: Secondary | ICD-10-CM | POA: Diagnosis not present

## 2019-10-27 DIAGNOSIS — Z0120 Encounter for dental examination and cleaning without abnormal findings: Secondary | ICD-10-CM | POA: Diagnosis not present

## 2019-11-01 DIAGNOSIS — J45909 Unspecified asthma, uncomplicated: Secondary | ICD-10-CM | POA: Diagnosis not present

## 2019-11-01 DIAGNOSIS — N200 Calculus of kidney: Secondary | ICD-10-CM | POA: Diagnosis not present

## 2019-11-01 DIAGNOSIS — Z6841 Body Mass Index (BMI) 40.0 and over, adult: Secondary | ICD-10-CM | POA: Diagnosis not present

## 2019-11-01 DIAGNOSIS — F331 Major depressive disorder, recurrent, moderate: Secondary | ICD-10-CM | POA: Diagnosis not present

## 2019-11-01 DIAGNOSIS — E782 Mixed hyperlipidemia: Secondary | ICD-10-CM | POA: Diagnosis not present

## 2019-11-01 DIAGNOSIS — N3281 Overactive bladder: Secondary | ICD-10-CM | POA: Diagnosis not present

## 2019-11-01 DIAGNOSIS — M542 Cervicalgia: Secondary | ICD-10-CM | POA: Diagnosis not present

## 2019-11-01 DIAGNOSIS — M5136 Other intervertebral disc degeneration, lumbar region: Secondary | ICD-10-CM | POA: Diagnosis not present

## 2019-11-01 DIAGNOSIS — G40509 Epileptic seizures related to external causes, not intractable, without status epilepticus: Secondary | ICD-10-CM | POA: Diagnosis not present

## 2019-11-01 DIAGNOSIS — F909 Attention-deficit hyperactivity disorder, unspecified type: Secondary | ICD-10-CM | POA: Diagnosis not present

## 2019-11-01 DIAGNOSIS — I10 Essential (primary) hypertension: Secondary | ICD-10-CM | POA: Diagnosis not present

## 2019-12-01 DIAGNOSIS — H353132 Nonexudative age-related macular degeneration, bilateral, intermediate dry stage: Secondary | ICD-10-CM | POA: Diagnosis not present

## 2019-12-27 DIAGNOSIS — Z0120 Encounter for dental examination and cleaning without abnormal findings: Secondary | ICD-10-CM | POA: Diagnosis not present

## 2020-02-17 DIAGNOSIS — B354 Tinea corporis: Secondary | ICD-10-CM | POA: Diagnosis not present

## 2020-02-17 DIAGNOSIS — B353 Tinea pedis: Secondary | ICD-10-CM | POA: Diagnosis not present

## 2020-02-17 DIAGNOSIS — E782 Mixed hyperlipidemia: Secondary | ICD-10-CM | POA: Diagnosis not present

## 2020-02-17 DIAGNOSIS — E11621 Type 2 diabetes mellitus with foot ulcer: Secondary | ICD-10-CM | POA: Diagnosis not present

## 2020-02-17 DIAGNOSIS — E1140 Type 2 diabetes mellitus with diabetic neuropathy, unspecified: Secondary | ICD-10-CM | POA: Diagnosis not present

## 2020-02-17 DIAGNOSIS — F331 Major depressive disorder, recurrent, moderate: Secondary | ICD-10-CM | POA: Diagnosis not present

## 2020-02-17 DIAGNOSIS — E1165 Type 2 diabetes mellitus with hyperglycemia: Secondary | ICD-10-CM | POA: Diagnosis not present

## 2020-02-17 DIAGNOSIS — E119 Type 2 diabetes mellitus without complications: Secondary | ICD-10-CM | POA: Diagnosis not present

## 2020-02-17 DIAGNOSIS — E1169 Type 2 diabetes mellitus with other specified complication: Secondary | ICD-10-CM | POA: Diagnosis not present

## 2020-02-17 DIAGNOSIS — D72829 Elevated white blood cell count, unspecified: Secondary | ICD-10-CM | POA: Diagnosis not present

## 2020-02-17 DIAGNOSIS — E785 Hyperlipidemia, unspecified: Secondary | ICD-10-CM | POA: Diagnosis not present

## 2020-02-22 DIAGNOSIS — F909 Attention-deficit hyperactivity disorder, unspecified type: Secondary | ICD-10-CM | POA: Diagnosis not present

## 2020-02-22 DIAGNOSIS — I10 Essential (primary) hypertension: Secondary | ICD-10-CM | POA: Diagnosis not present

## 2020-02-22 DIAGNOSIS — N200 Calculus of kidney: Secondary | ICD-10-CM | POA: Diagnosis not present

## 2020-02-22 DIAGNOSIS — M5136 Other intervertebral disc degeneration, lumbar region: Secondary | ICD-10-CM | POA: Diagnosis not present

## 2020-02-22 DIAGNOSIS — E1165 Type 2 diabetes mellitus with hyperglycemia: Secondary | ICD-10-CM | POA: Diagnosis not present

## 2020-02-22 DIAGNOSIS — M542 Cervicalgia: Secondary | ICD-10-CM | POA: Diagnosis not present

## 2020-02-22 DIAGNOSIS — N3281 Overactive bladder: Secondary | ICD-10-CM | POA: Diagnosis not present

## 2020-02-22 DIAGNOSIS — G40509 Epileptic seizures related to external causes, not intractable, without status epilepticus: Secondary | ICD-10-CM | POA: Diagnosis not present

## 2020-02-22 DIAGNOSIS — F331 Major depressive disorder, recurrent, moderate: Secondary | ICD-10-CM | POA: Diagnosis not present

## 2020-02-22 DIAGNOSIS — E782 Mixed hyperlipidemia: Secondary | ICD-10-CM | POA: Diagnosis not present

## 2020-02-22 DIAGNOSIS — J45909 Unspecified asthma, uncomplicated: Secondary | ICD-10-CM | POA: Diagnosis not present

## 2020-02-28 DIAGNOSIS — F411 Generalized anxiety disorder: Secondary | ICD-10-CM | POA: Diagnosis not present

## 2020-02-28 DIAGNOSIS — F3132 Bipolar disorder, current episode depressed, moderate: Secondary | ICD-10-CM | POA: Diagnosis not present

## 2020-02-28 DIAGNOSIS — F4310 Post-traumatic stress disorder, unspecified: Secondary | ICD-10-CM | POA: Diagnosis not present

## 2020-03-14 DIAGNOSIS — F4310 Post-traumatic stress disorder, unspecified: Secondary | ICD-10-CM | POA: Diagnosis not present

## 2020-03-14 DIAGNOSIS — F411 Generalized anxiety disorder: Secondary | ICD-10-CM | POA: Diagnosis not present

## 2020-03-14 DIAGNOSIS — F3132 Bipolar disorder, current episode depressed, moderate: Secondary | ICD-10-CM | POA: Diagnosis not present

## 2020-03-27 DIAGNOSIS — F909 Attention-deficit hyperactivity disorder, unspecified type: Secondary | ICD-10-CM | POA: Diagnosis not present

## 2020-03-27 DIAGNOSIS — F331 Major depressive disorder, recurrent, moderate: Secondary | ICD-10-CM | POA: Diagnosis not present

## 2020-05-18 ENCOUNTER — Encounter: Payer: Self-pay | Admitting: Emergency Medicine

## 2020-05-18 ENCOUNTER — Ambulatory Visit (INDEPENDENT_AMBULATORY_CARE_PROVIDER_SITE_OTHER): Payer: Medicaid Other

## 2020-05-18 ENCOUNTER — Ambulatory Visit
Admission: EM | Admit: 2020-05-18 | Discharge: 2020-05-18 | Disposition: A | Discharge status: Home / Self Care | Payer: Medicaid Other | Attending: Emergency Medicine | Admitting: Emergency Medicine

## 2020-05-18 DIAGNOSIS — R059 Cough, unspecified: Secondary | ICD-10-CM

## 2020-05-18 DIAGNOSIS — J441 Chronic obstructive pulmonary disease with (acute) exacerbation: Secondary | ICD-10-CM

## 2020-05-18 DIAGNOSIS — R05 Cough: Secondary | ICD-10-CM

## 2020-05-18 HISTORY — DX: Age-related osteoporosis without current pathological fracture: M81.0

## 2020-05-18 MED ORDER — ALBUTEROL SULFATE HFA 108 (90 BASE) MCG/ACT IN AERS: 1.0000 | INHALATION_SPRAY | Freq: Four times a day (QID) | RESPIRATORY_TRACT | 0 refills | Status: AC | PRN

## 2020-05-18 MED ORDER — PREDNISONE 20 MG PO TABS: 20.0000 mg | ORAL_TABLET | Freq: Two times a day (BID) | ORAL | 0 refills | Status: AC

## 2020-05-18 MED ORDER — AZITHROMYCIN 250 MG PO TABS: 250.0000 mg | ORAL_TABLET | Freq: Every day | ORAL | 0 refills | Status: DC

## 2020-05-18 MED ORDER — DEXAMETHASONE SODIUM PHOSPHATE 10 MG/ML IJ SOLN
10.0000 mg | Freq: Once | INTRAMUSCULAR | Status: AC
  Administered 2020-05-18: 10 mg via INTRAMUSCULAR

## 2020-05-18 NOTE — ED Provider Notes (Signed)
[facility]   [id number] 05/18/20 Arrival Time: 1158  Cc: COUGH  SUBJECTIVE:  Savannah Pena is a 59 y.o. female who presents with dry/ productive cough x 3 weeks.  Denies positive sick exposure or precipitating event.  Admits to hx of asthma, and COPD.   Has tried two rounds of antibiotics without relief.   Denies aggravating factors.  Reports previous symptoms in the past.   Complains of runny nose, and wheezing.  Denies fever, chills, fatigue, sinus pain, sore throat, SOB, chest pain, nausea, changes in bowel or bladder habits.    ROS: As per HPI.  All other pertinent ROS negative.     Past Medical History:  Diagnosis Date  . Anxiety   . Arthritis   . Asthma   . Attention deficit disorder (ADD)   . COPD (chronic obstructive pulmonary disease) (HCC)   . Diabetes mellitus without complication (HCC)   . Dyspnea    with exertion  . GERD (gastroesophageal reflux disease)   . History of kidney stones   . Hypertension   . Kidney stone   . Major depression   . Osteoporosis   . Seizures (HCC)    last seizure 23 years ago  . Sleep apnea    has C-PAP, but does not currently use  . Takotsubo cardiomyopathy    a. EF 35-40% by echo in 03/2018 with cath showing mild, nonobstructive CAD.    Past Surgical History:  Procedure Laterality Date  . CESAREAN SECTION    . CYSTOSCOPY WITH RETROGRADE PYELOGRAM, URETEROSCOPY AND STENT PLACEMENT Right 12/05/2016   Procedure: CYSTOSCOPY WITH RETROGRADE PYELOGRAM, URETEROSCOPY AND STENT PLACEMENT;  Surgeon: Sebastian Ache, MD;  Location: [facility] ORS;  Service: Urology;  Laterality: Right;  . CYSTOSCOPY WITH STENT PLACEMENT Right 11/06/2016   Procedure: CYSTOSCOPY WITH STENT PLACEMENT;  Surgeon: Bjorn Pippin, MD;  Location: [facility] ORS;  Service: Urology;  Laterality: Right;  . FLEXOR TENDON REPAIR Right    foot  . KIDNEY STONE SURGERY    . LEFT HEART CATH AND CORONARY ANGIOGRAPHY N/A 03/17/2018   Procedure: LEFT HEART CATH AND CORONARY ANGIOGRAPHY;   Surgeon: Kathleene Hazel, MD;  Location: [facility];  Service: Cardiovascular;  Laterality: N/A;   Allergies  Allergen Reactions  . Fruit Extracts Nausea And Vomiting    Bananas   No current facility-administered medications on file prior to encounter.   Current Outpatient Medications on File Prior to Encounter  Medication Sig Dispense Refill  . ADDERALL XR 30 MG 24 hr capsule     . aspirin EC 81 MG EC tablet Take 1 tablet (81 mg total) by mouth daily.    Marland Kitchen atorvastatin (LIPITOR) 40 MG tablet Take 1 tablet (40 mg total) by mouth daily at 6 PM. 90 tablet 3  . buPROPion (WELLBUTRIN XL) 150 MG 24 hr tablet Take 150 mg by mouth daily.  0  . cetirizine (ZYRTEC) 10 MG tablet Take 10 mg by mouth daily.    . D3-50 50000 units capsule Take 50,000 Units by mouth once a week.  1  . diclofenac (VOLTAREN) 50 MG EC tablet Take 50 mg by mouth 2 (two) times daily.  5  . DULoxetine (CYMBALTA) 60 MG capsule Take 60 mg by mouth daily.    . Fluticasone Propionate HFA (FLOVENT HFA IN) Inhale into the lungs.    . gabapentin (NEURONTIN) 300 MG capsule Take 300 mg by mouth 2 (two) times daily.  5  . lisinopril (PRINIVIL,ZESTRIL) 10 MG tablet Take  1 tablet (10 mg total) by mouth daily. 90 tablet 3  . metFORMIN (GLUCOPHAGE) 500 MG tablet Take 1 tablet (500 mg total) by mouth 2 (two) times daily with a meal. Start 03/20/2018 60 tablet 0  . methocarbamol (ROBAXIN) 750 MG tablet     . metoprolol succinate (TOPROL XL) 25 MG 24 hr tablet Take 1 tablet (25 mg total) by mouth daily. 90 tablet 3  . montelukast (SINGULAIR) 10 MG tablet Take 10 mg by mouth daily.    . Multiple Vitamins-Minerals (PRESERVISION AREDS 2 PO) Take by mouth 2 (two) times daily.    . Omega 3 1000 MG CAPS Take by mouth 3 (three) times daily.    Marland Kitchen omeprazole (PRILOSEC) 40 MG capsule Take 40 mg by mouth daily.    Marland Kitchen oxybutynin (DITROPAN) 5 MG tablet Take 1 tablet (5 mg total) by mouth every 8 (eight) hours as needed for bladder spasms.  And stent discomfort. 90 tablet 1  . phentermine (ADIPEX-P) 37.5 MG tablet     . phenytoin (DILANTIN) 200 MG ER capsule Take 1 capsule (200 mg total) by mouth 2 (two) times daily. 60 capsule 0  . Tiotropium Bromide-Olodaterol (STIOLTO RESPIMAT) 2.5-2.5 MCG/ACT AERS Inhale into the lungs.    Marland Kitchen tiZANidine (ZANAFLEX) 4 MG tablet Take 4 mg by mouth every 6 (six) hours as needed for muscle spasms.    . Vitamin D, Ergocalciferol, (DRISDOL) 50000 units CAPS capsule Take 50,000 Units by mouth every 7 (seven) days.      Social History   Socioeconomic History  . Marital status: Single    Spouse name: Not on file  . Number of children: Not on file  . Years of education: Not on file  . Highest education level: Not on file  Occupational History  . Not on file  Tobacco Use  . Smoking status: Former Smoker    Types: Cigarettes    Quit date: 11/12/2016    Years since quitting: 3.5  . Smokeless tobacco: Former Neurosurgeon  . Tobacco comment: 04-08-2017 per pt stopped in Jan 2018  Vaping Use  . Vaping Use: Never used  Substance and Sexual Activity  . Alcohol use: Yes    Comment: occassional, 04-08-2017 per pt occas.  . Drug use: No    Comment: 04-08-2017 per pt no   . Sexual activity: Yes    Birth control/protection: Post-menopausal  Other Topics Concern  . Not on file  Social History Narrative  . Not on file   Social Determinants of Health   Financial Resource Strain:   . Difficulty of Paying Living Expenses:   Food Insecurity:   . Worried About Programme researcher, broadcasting/film/video in the Last Year:   . Barista in the Last Year:   Transportation Needs:   . Freight forwarder (Medical):   Marland Kitchen Lack of Transportation (Non-Medical):   Physical Activity:   . Days of Exercise per Week:   . Minutes of Exercise per Session:   Stress:   . Feeling of Stress :   Social Connections:   . Frequency of Communication with Friends and Family:   . Frequency of Social Gatherings with Friends and Family:   . Attends  Religious Services:   . Active Member of Clubs or Organizations:   . Attends Banker Meetings:   Marland Kitchen Marital Status:   Intimate Partner Violence:   . Fear of Current or Ex-Partner:   . Emotionally Abused:   Marland Kitchen Physically Abused:   .  Sexually Abused:    Family History  Problem Relation Age of Onset  . Prostate cancer Father   . Diabetes Father      OBJECTIVE:  Vitals:   05/18/20 1210 05/18/20 1226  BP:  139/77  Pulse:  63  Resp:  19  Temp:  98.3 F (36.8 C)  TempSrc:  Oral  SpO2:  95%  Weight: 190 lb (86.2 kg)   Height: 5\' 4"  (1.626 m)      General appearance: Alert, mildly fatigued appearing, nontoxic; speaking in full sentences without difficulty HEENT:NCAT; Ears: EACs clear, TMs pearly gray; Eyes: PERRL.  EOM grossly intact. Nose: nares patent without rhinorrhea; Throat: tonsils nonerythematous or enlarged, uvula midline  Neck: supple without LAD Lungs: clear to auscultation bilaterally without adventitious breath sounds; normal respiratory effort; mild cough present Heart: regular rate and rhythm.   Skin: warm and dry Psychological: alert and cooperative; normal mood and affect  DIAGNOSTIC STUDIES:  DG Chest 2 View  Result Date: 05/18/2020 CLINICAL DATA:  Cough for 6 months EXAM: CHEST - 2 VIEW COMPARISON:  Mar 15, 2018 FINDINGS: The heart size and mediastinal contours are within normal limits. Both lungs are clear. The visualized skeletal structures are unremarkable. IMPRESSION: No active cardiopulmonary disease. Electronically Signed   By: Mar 17, 2018 M.D.   On: 05/18/2020 12:41    X-rays negative for cardiopulmonary disease  I have reviewed the x-rays myself and the radiologist interpretation. I am in agreement with the radiologist interpretation.     ASSESSMENT & PLAN:  1. Cough   2. COPD exacerbation (HCC)     Meds ordered this encounter  Medications  . predniSONE (DELTASONE) 20 MG tablet    Sig: Take 1 tablet (20 mg total) by mouth 2 (two)  times daily with a meal for 5 days.    Dispense:  10 tablet    Refill:  0    Order Specific Question:   Supervising Provider    Answer:   07/19/2020 Eustace Moore  . azithromycin (ZITHROMAX) 250 MG tablet    Sig: Take 1 tablet (250 mg total) by mouth daily. Take first 2 tablets together, then 1 every day until finished.    Dispense:  6 tablet    Refill:  0    Order Specific Question:   Supervising Provider    Answer:   [[id number]] Eustace Moore  . albuterol (VENTOLIN HFA) 108 (90 Base) MCG/ACT inhaler    Sig: Inhale 1-2 puffs into the lungs every 6 (six) hours as needed for wheezing or shortness of breath.    Dispense:  18 g    Refill:  0    Order Specific Question:   Supervising Provider    Answer:   [[id number]] Eustace Moore  . dexamethasone (DECADRON) injection 10 mg    Orders Placed This Encounter  Procedures  . DG Chest 2 View    Standing Status:   Standing    Number of Occurrences:   1    Order Specific Question:   Reason for Exam (SYMPTOM  OR DIAGNOSIS REQUIRED)    Answer:   cough     Inhaler prescribed.  Use as directed Get plenty of rest and push fluids Steroid shot given in office Prednisone prescribed.  Take as directed and to completion Azithromycin prescribed.  Take as directed and to completion Follow up with PCP for recheck and/or if symptoms persists Return or go to ER if you have any new or worsening symptoms such as  fever, chills, fatigue, shortness of breath, wheezing, chest pain, nausea, changes in bowel or bladder habits, etc...   Reviewed expectations re: course of current medical issues. Questions answered. Outlined signs and symptoms indicating need for more acute intervention. Patient verbalized understanding. After Visit Summary given       Rennis Harding, PA-C 05/18/20 1251

## 2020-05-18 NOTE — Discharge Instructions (Signed)
Inhaler prescribed.  Use as directed Get plenty of rest and push fluids Steroid shot given in office Prednisone prescribed.  Take as directed and to completion Azithromycin prescribed.  Take as directed and to completion Follow up with PCP for recheck and/or if symptoms persists Return or go to ER if you have any new or worsening symptoms such as fever, chills, fatigue, shortness of breath, wheezing, chest pain, nausea, changes in bowel or bladder habits, etc..Marland Kitchen

## 2020-05-18 NOTE — ED Triage Notes (Addendum)
Cough and chest congestion since February.  Pt states she has been on 2 different abx since then with no relief.  States she has not had a chest xray.

## 2020-07-24 DIAGNOSIS — Z0120 Encounter for dental examination and cleaning without abnormal findings: Secondary | ICD-10-CM | POA: Diagnosis not present

## 2020-07-26 DIAGNOSIS — J45909 Unspecified asthma, uncomplicated: Secondary | ICD-10-CM | POA: Diagnosis not present

## 2020-07-26 DIAGNOSIS — M542 Cervicalgia: Secondary | ICD-10-CM | POA: Diagnosis not present

## 2020-07-26 DIAGNOSIS — E1140 Type 2 diabetes mellitus with diabetic neuropathy, unspecified: Secondary | ICD-10-CM | POA: Diagnosis not present

## 2020-07-26 DIAGNOSIS — G4089 Other seizures: Secondary | ICD-10-CM | POA: Diagnosis not present

## 2020-07-26 DIAGNOSIS — J4520 Mild intermittent asthma, uncomplicated: Secondary | ICD-10-CM | POA: Diagnosis not present

## 2020-07-26 DIAGNOSIS — N200 Calculus of kidney: Secondary | ICD-10-CM | POA: Diagnosis not present

## 2020-07-26 DIAGNOSIS — Z6841 Body Mass Index (BMI) 40.0 and over, adult: Secondary | ICD-10-CM | POA: Diagnosis not present

## 2020-07-26 DIAGNOSIS — J449 Chronic obstructive pulmonary disease, unspecified: Secondary | ICD-10-CM | POA: Diagnosis not present

## 2020-07-26 DIAGNOSIS — R41840 Attention and concentration deficit: Secondary | ICD-10-CM | POA: Diagnosis not present

## 2020-07-26 DIAGNOSIS — F331 Major depressive disorder, recurrent, moderate: Secondary | ICD-10-CM | POA: Diagnosis not present

## 2020-07-26 DIAGNOSIS — I10 Essential (primary) hypertension: Secondary | ICD-10-CM | POA: Diagnosis not present

## 2020-07-26 DIAGNOSIS — N3281 Overactive bladder: Secondary | ICD-10-CM | POA: Diagnosis not present

## 2020-09-09 ENCOUNTER — Other Ambulatory Visit: Payer: Self-pay

## 2020-09-09 ENCOUNTER — Ambulatory Visit
Admission: EM | Admit: 2020-09-09 | Discharge: 2020-09-09 | Disposition: A | Discharge status: Home / Self Care | Payer: Medicaid Other

## 2020-09-09 DIAGNOSIS — H5789 Other specified disorders of eye and adnexa: Secondary | ICD-10-CM

## 2020-09-09 DIAGNOSIS — H04202 Unspecified epiphora, left lacrimal gland: Secondary | ICD-10-CM

## 2020-09-09 MED ORDER — POLYMYXIN B-TRIMETHOPRIM 10000-0.1 UNIT/ML-% OP SOLN: 1.0000 [drp] | Freq: Four times a day (QID) | OPHTHALMIC | 0 refills | Status: AC

## 2020-09-09 NOTE — Discharge Instructions (Signed)
Will cover for eye infection today Use ofloxacin as prescribed and to completion Use OTC systane or genteal gel eye drops at night as needed for symptomatic relief Use OTC ibuprofen or tylenol as needed for pain relief Return here or follow up with ophthamolgy if symptoms persists or worsen such as fever, chills, redness, swelling, eye pain, painful eye movements, vision changes, etc..Marland Kitchen

## 2020-09-09 NOTE — ED Triage Notes (Signed)
Pt presents with left eye watering and irritation since Thursday, pt states after removing contacts it began, she has taken benadryl and zyrtec today

## 2020-09-09 NOTE — ED Provider Notes (Signed)
[facility]   [id number] 09/09/20 Arrival Time: 1154  CC: LT eye irritation  SUBJECTIVE:  Savannah Pena is a 59 y.o. female who presents with complaint of LT eye irritation and eye tearing x 3 days.  Symptoms began after removing contact.  Has NOT tried OTC medications.  Symptoms are made worse with light.  Denies similar symptoms in the past.  Complains of mild LT sided sinus congestion.  Denies fever, chills, nausea, vomiting, eye pain, painful eye movements, itching, vision changes, double vision, FB sensation, periorbital erythema.     ROS: As per HPI.  All other pertinent ROS negative.     Past Medical History:  Diagnosis Date  . Anxiety   . Arthritis   . Asthma   . Attention deficit disorder (ADD)   . COPD (chronic obstructive pulmonary disease) (HCC)   . Diabetes mellitus without complication (HCC)   . Dyspnea    with exertion  . GERD (gastroesophageal reflux disease)   . History of kidney stones   . Hypertension   . Kidney stone   . Major depression   . Osteoporosis   . Seizures (HCC)    last seizure 23 years ago  . Sleep apnea    has C-PAP, but does not currently use  . Takotsubo cardiomyopathy    a. EF 35-40% by echo in 03/2018 with cath showing mild, nonobstructive CAD.    Past Surgical History:  Procedure Laterality Date  . CESAREAN SECTION    . CYSTOSCOPY WITH RETROGRADE PYELOGRAM, URETEROSCOPY AND STENT PLACEMENT Right 12/05/2016   Procedure: CYSTOSCOPY WITH RETROGRADE PYELOGRAM, URETEROSCOPY AND STENT PLACEMENT;  Surgeon: Sebastian Ache, MD;  Location: [facility] ORS;  Service: Urology;  Laterality: Right;  . CYSTOSCOPY WITH STENT PLACEMENT Right 11/06/2016   Procedure: CYSTOSCOPY WITH STENT PLACEMENT;  Surgeon: Bjorn Pippin, MD;  Location: [facility] ORS;  Service: Urology;  Laterality: Right;  . FLEXOR TENDON REPAIR Right    foot  . KIDNEY STONE SURGERY    . LEFT HEART CATH AND CORONARY ANGIOGRAPHY N/A 03/17/2018   Procedure: LEFT HEART CATH AND CORONARY  ANGIOGRAPHY;  Surgeon: Kathleene Hazel, MD;  Location: [facility];  Service: Cardiovascular;  Laterality: N/A;   Allergies  Allergen Reactions  . Fruit Extracts Nausea And Vomiting    Bananas   No current facility-administered medications on file prior to encounter.   Current Outpatient Medications on File Prior to Encounter  Medication Sig Dispense Refill  . albuterol (VENTOLIN HFA) 108 (90 Base) MCG/ACT inhaler Inhale 1-2 puffs into the lungs every 6 (six) hours as needed for wheezing or shortness of breath. 18 g 0  . aspirin EC 81 MG EC tablet Take 1 tablet (81 mg total) by mouth daily.    Marland Kitchen atorvastatin (LIPITOR) 40 MG tablet Take 1 tablet (40 mg total) by mouth daily at 6 PM. 90 tablet 3  . azithromycin (ZITHROMAX) 250 MG tablet Take 1 tablet (250 mg total) by mouth daily. Take first 2 tablets together, then 1 every day until finished. 6 tablet 0  . buPROPion (WELLBUTRIN XL) 150 MG 24 hr tablet Take 150 mg by mouth daily.  0  . cetirizine (ZYRTEC) 10 MG tablet Take 10 mg by mouth daily.    . D3-50 50000 units capsule Take 50,000 Units by mouth once a week.  1  . desvenlafaxine (PRISTIQ) 50 MG 24 hr tablet Take 50 mg by mouth daily.    . diclofenac (VOLTAREN) 50 MG EC tablet Take 50 mg  by mouth 2 (two) times daily.  5  . DULoxetine (CYMBALTA) 60 MG capsule Take 60 mg by mouth daily.    . Fluticasone Propionate HFA (FLOVENT HFA IN) Inhale into the lungs.    . gabapentin (NEURONTIN) 300 MG capsule Take 300 mg by mouth 2 (two) times daily.  5  . lisinopril (PRINIVIL,ZESTRIL) 10 MG tablet Take 1 tablet (10 mg total) by mouth daily. 90 tablet 3  . metFORMIN (GLUCOPHAGE) 500 MG tablet Take 1 tablet (500 mg total) by mouth 2 (two) times daily with a meal. Start 03/20/2018 60 tablet 0  . methocarbamol (ROBAXIN) 750 MG tablet     . metoprolol succinate (TOPROL XL) 25 MG 24 hr tablet Take 1 tablet (25 mg total) by mouth daily. 90 tablet 3  . montelukast (SINGULAIR) 10 MG tablet  Take 10 mg by mouth daily.    . Multiple Vitamins-Minerals (PRESERVISION AREDS 2 PO) Take by mouth 2 (two) times daily.    . Omega 3 1000 MG CAPS Take by mouth 3 (three) times daily.    Marland Kitchen omeprazole (PRILOSEC) 40 MG capsule Take 40 mg by mouth daily.    Marland Kitchen oxybutynin (DITROPAN) 5 MG tablet Take 1 tablet (5 mg total) by mouth every 8 (eight) hours as needed for bladder spasms. And stent discomfort. 90 tablet 1  . phentermine (ADIPEX-P) 37.5 MG tablet     . phenytoin (DILANTIN) 200 MG ER capsule Take 1 capsule (200 mg total) by mouth 2 (two) times daily. 60 capsule 0  . Tiotropium Bromide-Olodaterol (STIOLTO RESPIMAT) 2.5-2.5 MCG/ACT AERS Inhale into the lungs.    Marland Kitchen tiZANidine (ZANAFLEX) 4 MG tablet Take 4 mg by mouth every 6 (six) hours as needed for muscle spasms.    . Vitamin D, Ergocalciferol, (DRISDOL) 50000 units CAPS capsule Take 50,000 Units by mouth every 7 (seven) days.    Marland Kitchen VYVANSE 70 MG capsule Take 70 mg by mouth at bedtime.     Social History   Socioeconomic History  . Marital status: Single    Spouse name: Not on file  . Number of children: Not on file  . Years of education: Not on file  . Highest education level: Not on file  Occupational History  . Not on file  Tobacco Use  . Smoking status: Former Smoker    Types: Cigarettes    Quit date: 11/12/2016    Years since quitting: 3.8  . Smokeless tobacco: Former Neurosurgeon  . Tobacco comment: 04-08-2017 per pt stopped in Jan 2018  Vaping Use  . Vaping Use: Never used  Substance and Sexual Activity  . Alcohol use: Yes    Comment: occassional, 04-08-2017 per pt occas.  . Drug use: No    Comment: 04-08-2017 per pt no   . Sexual activity: Yes    Birth control/protection: Post-menopausal  Other Topics Concern  . Not on file  Social History Narrative  . Not on file   Social Determinants of Health   Financial Resource Strain:   . Difficulty of Paying Living Expenses: Not on file  Food Insecurity:   . Worried About Patent examiner in the Last Year: Not on file  . Ran Out of Food in the Last Year: Not on file  Transportation Needs:   . Lack of Transportation (Medical): Not on file  . Lack of Transportation (Non-Medical): Not on file  Physical Activity:   . Days of Exercise per Week: Not on file  . Minutes of Exercise per  Session: Not on file  Stress:   . Feeling of Stress : Not on file  Social Connections:   . Frequency of Communication with Friends and Family: Not on file  . Frequency of Social Gatherings with Friends and Family: Not on file  . Attends Religious Services: Not on file  . Active Member of Clubs or Organizations: Not on file  . Attends Banker Meetings: Not on file  . Marital Status: Not on file  Intimate Partner Violence:   . Fear of Current or Ex-Partner: Not on file  . Emotionally Abused: Not on file  . Physically Abused: Not on file  . Sexually Abused: Not on file   Family History  Problem Relation Age of Onset  . Prostate cancer Father   . Diabetes Father     OBJECTIVE:  Vitals:   09/09/20 1238  BP: (!) 168/93  Pulse: 94  Resp: 20  Temp: 98.3 F (36.8 C)  SpO2: 94%    General appearance: alert; no distress Eyes: Trace conjunctival erythema. PERRL; EOMI without discomfort;  no obvious drainage; lid everted without obvious FB; no obvious fluorescein uptake  ENT: Ears: EACs clear, TMs pearly gray;  Nose: no rhinorrhea; tonsils nonerythematous, uvula midline  Neck: supple Lungs: clear to auscultation bilaterally Heart: regular rate and rhythm Skin: warm and dry Psychological: alert and cooperative; normal mood and affect   ASSESSMENT & PLAN:  1. Irritation of left eye   2. Eye tearing, left     Meds ordered this encounter  Medications  . trimethoprim-polymyxin b (POLYTRIM) ophthalmic solution    Sig: Place 1 drop into the left eye 4 (four) times daily for 10 days.    Dispense:  10 mL    Refill:  0    Order Specific Question:   Supervising  Provider    Answer:   Eustace Moore [[id number]]   Will cover for eye infection today Use ofloxacin as prescribed and to completion Use OTC systane or genteal gel eye drops at night as needed for symptomatic relief Use OTC ibuprofen or tylenol as needed for pain relief Return here or follow up with ophthamolgy if symptoms persists or worsen such as fever, chills, redness, swelling, eye pain, painful eye movements, vision changes, etc...   Reviewed expectations re: course of current medical issues. Questions answered. Outlined signs and symptoms indicating need for more acute intervention. Patient verbalized understanding. After Visit Summary given.   Rennis Harding, PA-C 09/09/20 1257

## 2020-09-13 NOTE — Progress Notes (Signed)
09/14/2020 1:38 PM   Savannah Pena 1961-09-13 [id number]  Referring provider: Benita Stabile, MD [address] [name],  [place] [address] Chief Complaint  Patient presents with  . Nephrolithiasis   Urologic problem list:  1 - Recurrent Nephrolithiasis-  Pre 2018 - SWL x2, cystolithalopexy x1. 11/2016 - medical passage 60mm ureteral stone after stenting (ureteroscopy after confirms stone free).   2 - Medical Stone Disease/ Hyperoxaluria / Hypocitraturia-  Eval 2018: BMP (low-normal Ca 8.4), PTH, Urate - normal; Composition - pending; 24 hr urines -mild high oxalate (40) and low citrate  HPI: Savannah Pena is a 59 y.o. female who returns for a 1 year follow up of lower urinary tract symptoms, nocturia and history of urinary tract calculi.   -The patient is doing well.  -Denies any recent stone episodes.  - No complaints.  - KUB shows no calcifications suspicious for urinary tract stones.   PMH: Past Medical History:  Diagnosis Date  . Anxiety   . Arthritis   . Asthma   . Attention deficit disorder (ADD)   . COPD (chronic obstructive pulmonary disease) (HCC)   . Diabetes mellitus without complication (HCC)   . Dyspnea    with exertion  . GERD (gastroesophageal reflux disease)   . History of kidney stones   . Hypertension   . Kidney stone   . Major depression   . Osteoporosis   . Seizures (HCC)    last seizure 23 years ago  . Sleep apnea    has C-PAP, but does not currently use  . Takotsubo cardiomyopathy    a. EF 35-40% by echo in 03/2018 with cath showing mild, nonobstructive CAD.     Surgical History: Past Surgical History:  Procedure Laterality Date  . CESAREAN SECTION    . CYSTOSCOPY WITH RETROGRADE PYELOGRAM, URETEROSCOPY AND STENT PLACEMENT Right 12/05/2016   Procedure: CYSTOSCOPY WITH RETROGRADE PYELOGRAM, URETEROSCOPY AND STENT PLACEMENT;  Surgeon: Sebastian Ache, MD;  Location: [facility];  Service: Urology;  Laterality: Right;  . CYSTOSCOPY WITH  STENT PLACEMENT Right 11/06/2016   Procedure: CYSTOSCOPY WITH STENT PLACEMENT;  Surgeon: Bjorn Pippin, MD;  Location: [facility];  Service: Urology;  Laterality: Right;  . FLEXOR TENDON REPAIR Right    foot  . KIDNEY STONE SURGERY    . LEFT HEART CATH AND CORONARY ANGIOGRAPHY N/A 03/17/2018   Procedure: LEFT HEART CATH AND CORONARY ANGIOGRAPHY;  Surgeon: Kathleene Hazel, MD;  [facility];  Service: Cardiovascular;  Laterality: N/A;    Home Medications:  Allergies as of 09/14/2020      Reactions   Fruit Extracts Nausea And Vomiting   Bananas      Medication List       Accurate as of September 14, 2020  1:38 PM. If you have any questions, ask your nurse or doctor.        albuterol 108 (90 Base) MCG/ACT inhaler Commonly known as: VENTOLIN HFA Inhale 1-2 puffs into the lungs every 6 (six) hours as needed for wheezing or shortness of breath.   aspirin 81 MG EC tablet Take 1 tablet (81 mg total) by mouth daily.   atorvastatin 40 MG tablet Commonly known as: LIPITOR Take 1 tablet (40 mg total) by mouth daily at 6 PM.   azithromycin 250 MG tablet Commonly known as: ZITHROMAX Take 1 tablet (250 mg total) by mouth daily. Take first 2 tablets together, then 1 every day until finished.   buPROPion 150 MG 24 hr  tablet Commonly known as: WELLBUTRIN XL Take 150 mg by mouth daily.   cetirizine 10 MG tablet Commonly known as: ZYRTEC Take 10 mg by mouth daily.   D3-50 1.25 MG (50000 UT) capsule Generic drug: Cholecalciferol Take 50,000 Units by mouth once a week.   desvenlafaxine 50 MG 24 hr tablet Commonly known as: PRISTIQ Take 50 mg by mouth daily.   diclofenac 50 MG EC tablet Commonly known as: VOLTAREN Take 50 mg by mouth 2 (two) times daily.   DULoxetine 60 MG capsule Commonly known as: CYMBALTA Take 60 mg by mouth daily.   FLOVENT HFA IN Inhale into the lungs.   gabapentin 300 MG capsule Commonly known as: NEURONTIN Take 300 mg by mouth 2 (two)  times daily.   lisinopril 10 MG tablet Commonly known as: ZESTRIL Take 1 tablet (10 mg total) by mouth daily.   metFORMIN 500 MG tablet Commonly known as: GLUCOPHAGE Take 1 tablet (500 mg total) by mouth 2 (two) times daily with a meal. Start 03/20/2018   methocarbamol 750 MG tablet Commonly known as: ROBAXIN   metoprolol succinate 25 MG 24 hr tablet Commonly known as: Toprol XL Take 1 tablet (25 mg total) by mouth daily.   montelukast 10 MG tablet Commonly known as: SINGULAIR Take 10 mg by mouth daily.   Omega 3 1000 MG Caps Take by mouth 3 (three) times daily.   omeprazole 40 MG capsule Commonly known as: PRILOSEC Take 40 mg by mouth daily.   oxybutynin 5 MG tablet Commonly known as: DITROPAN Take 1 tablet (5 mg total) by mouth every 8 (eight) hours as needed for bladder spasms. And stent discomfort.   phentermine 37.5 MG tablet Commonly known as: ADIPEX-P   phenytoin 200 MG ER capsule Commonly known as: DILANTIN Take 1 capsule (200 mg total) by mouth 2 (two) times daily.   PRESERVISION AREDS 2 PO Take by mouth 2 (two) times daily.   Stiolto Respimat 2.5-2.5 MCG/ACT Aers Generic drug: Tiotropium Bromide-Olodaterol Inhale into the lungs.   tiZANidine 4 MG tablet Commonly known as: ZANAFLEX Take 4 mg by mouth every 6 (six) hours as needed for muscle spasms.   trimethoprim-polymyxin b ophthalmic solution Commonly known as: POLYTRIM Place 1 drop into the left eye 4 (four) times daily for 10 days.   Vitamin D (Ergocalciferol) 1.25 MG (50000 UNIT) Caps capsule Commonly known as: DRISDOL Take 50,000 Units by mouth every 7 (seven) days.   Vyvanse 70 MG capsule Generic drug: lisdexamfetamine Take 70 mg by mouth at bedtime.       Allergies:  Allergies  Allergen Reactions  . Fruit Extracts Nausea And Vomiting    Bananas    Family History: Family History  Problem Relation Age of Onset  . Prostate cancer Father   . Diabetes Father     Social History:   reports that she quit smoking about 3 years ago. Her smoking use included cigarettes. She has quit using smokeless tobacco. She reports current alcohol use. She reports that she does not use drugs.   Physical Exam: BP 128/82   Pulse (!) 109   Ht 5\' 7"  (1.702 m)   Wt 190 lb (86.2 kg)   LMP 12/06/2011 (Approximate)   BMI 29.76 kg/m   Constitutional:  Alert and oriented, No acute distress. HEENT: [place] AT, moist mucus membranes.  Trachea midline, no masses. Cardiovascular: No clubbing, cyanosis, or edema. Respiratory: Normal respiratory effort, no increased work of breathing. Skin: No rashes, bruises or suspicious lesions. Neurologic:  Grossly intact, no focal deficits, moving all 4 extremities. Psychiatric: Normal mood and affect.  Laboratory Data:  Pertinent Imaging: KUB personally reviewed and no calcifications identified suspicious for urinary tract stones   Assessment & Plan:    1. History of urinary tract calculi Asymptomatic  KUB reviewed and shows no calcifications suspicious for urinary tract stones.   Follow up in 1 year with KUB.  No follow-ups on file.  [facility] [address] [place], [place] [address] [phone]  I, Theador Hawthorne, am acting as a scribe for Dr. Lorin Picket C. Vanice Rappa,  I have reviewed the above documentation for accuracy and completeness, and I agree with the above.    Riki Altes, MD

## 2020-09-14 ENCOUNTER — Ambulatory Visit
Admission: RE | Admit: 2020-09-14 | Discharge: 2020-09-14 | Disposition: A | Discharge status: Home / Self Care | Payer: Medicaid Other | Attending: Urology | Admitting: Urology

## 2020-09-14 ENCOUNTER — Ambulatory Visit
Admission: RE | Admit: 2020-09-14 | Discharge: 2020-09-14 | Disposition: A | Discharge status: Home / Self Care | Payer: Medicaid Other | Source: Ambulatory Visit | Attending: Urology | Admitting: Urology

## 2020-09-14 ENCOUNTER — Encounter: Payer: Self-pay | Admitting: Urology

## 2020-09-14 ENCOUNTER — Ambulatory Visit (INDEPENDENT_AMBULATORY_CARE_PROVIDER_SITE_OTHER): Payer: Medicaid Other | Admitting: Urology

## 2020-09-14 ENCOUNTER — Other Ambulatory Visit: Payer: Self-pay

## 2020-09-14 VITALS — BP 128/82 | HR 109 | Ht 67.0 in | Wt 190.0 lb

## 2020-09-14 DIAGNOSIS — Z87442 Personal history of urinary calculi: Secondary | ICD-10-CM | POA: Insufficient documentation

## 2020-09-16 ENCOUNTER — Encounter: Payer: Self-pay | Admitting: Urology

## 2020-12-05 DIAGNOSIS — N3281 Overactive bladder: Secondary | ICD-10-CM | POA: Diagnosis not present

## 2020-12-05 DIAGNOSIS — J4520 Mild intermittent asthma, uncomplicated: Secondary | ICD-10-CM | POA: Diagnosis not present

## 2020-12-05 DIAGNOSIS — N200 Calculus of kidney: Secondary | ICD-10-CM | POA: Diagnosis not present

## 2020-12-05 DIAGNOSIS — R41840 Attention and concentration deficit: Secondary | ICD-10-CM | POA: Diagnosis not present

## 2020-12-05 DIAGNOSIS — M542 Cervicalgia: Secondary | ICD-10-CM | POA: Diagnosis not present

## 2020-12-05 DIAGNOSIS — G4089 Other seizures: Secondary | ICD-10-CM | POA: Diagnosis not present

## 2020-12-05 DIAGNOSIS — J45909 Unspecified asthma, uncomplicated: Secondary | ICD-10-CM | POA: Diagnosis not present

## 2020-12-05 DIAGNOSIS — Z6841 Body Mass Index (BMI) 40.0 and over, adult: Secondary | ICD-10-CM | POA: Diagnosis not present

## 2020-12-05 DIAGNOSIS — E1140 Type 2 diabetes mellitus with diabetic neuropathy, unspecified: Secondary | ICD-10-CM | POA: Diagnosis not present

## 2020-12-05 DIAGNOSIS — F331 Major depressive disorder, recurrent, moderate: Secondary | ICD-10-CM | POA: Diagnosis not present

## 2020-12-05 DIAGNOSIS — Z712 Person consulting for explanation of examination or test findings: Secondary | ICD-10-CM | POA: Diagnosis not present

## 2020-12-05 DIAGNOSIS — J449 Chronic obstructive pulmonary disease, unspecified: Secondary | ICD-10-CM | POA: Diagnosis not present

## 2021-01-11 ENCOUNTER — Other Ambulatory Visit: Payer: Self-pay

## 2021-01-11 ENCOUNTER — Emergency Department (HOSPITAL_COMMUNITY)
Admission: EM | Admit: 2021-01-11 | Discharge: 2021-01-12 | Disposition: A | Discharge status: Home / Self Care | Payer: Medicaid Other | Attending: Emergency Medicine | Admitting: Emergency Medicine

## 2021-01-11 ENCOUNTER — Emergency Department (HOSPITAL_COMMUNITY): Payer: Medicaid Other

## 2021-01-11 ENCOUNTER — Encounter (HOSPITAL_COMMUNITY): Payer: Self-pay

## 2021-01-11 DIAGNOSIS — E119 Type 2 diabetes mellitus without complications: Secondary | ICD-10-CM | POA: Diagnosis not present

## 2021-01-11 DIAGNOSIS — M5410 Radiculopathy, site unspecified: Secondary | ICD-10-CM

## 2021-01-11 DIAGNOSIS — Z7984 Long term (current) use of oral hypoglycemic drugs: Secondary | ICD-10-CM | POA: Diagnosis not present

## 2021-01-11 DIAGNOSIS — Z79899 Other long term (current) drug therapy: Secondary | ICD-10-CM | POA: Diagnosis not present

## 2021-01-11 DIAGNOSIS — Z7952 Long term (current) use of systemic steroids: Secondary | ICD-10-CM | POA: Diagnosis not present

## 2021-01-11 DIAGNOSIS — R531 Weakness: Secondary | ICD-10-CM | POA: Diagnosis not present

## 2021-01-11 DIAGNOSIS — J449 Chronic obstructive pulmonary disease, unspecified: Secondary | ICD-10-CM | POA: Insufficient documentation

## 2021-01-11 DIAGNOSIS — Z87891 Personal history of nicotine dependence: Secondary | ICD-10-CM | POA: Diagnosis not present

## 2021-01-11 DIAGNOSIS — M5414 Radiculopathy, thoracic region: Secondary | ICD-10-CM | POA: Diagnosis not present

## 2021-01-11 DIAGNOSIS — I10 Essential (primary) hypertension: Secondary | ICD-10-CM | POA: Diagnosis not present

## 2021-01-11 DIAGNOSIS — M25512 Pain in left shoulder: Secondary | ICD-10-CM | POA: Diagnosis present

## 2021-01-11 DIAGNOSIS — M2578 Osteophyte, vertebrae: Secondary | ICD-10-CM | POA: Diagnosis not present

## 2021-01-11 DIAGNOSIS — J45909 Unspecified asthma, uncomplicated: Secondary | ICD-10-CM | POA: Insufficient documentation

## 2021-01-11 DIAGNOSIS — Z7982 Long term (current) use of aspirin: Secondary | ICD-10-CM | POA: Diagnosis not present

## 2021-01-11 DIAGNOSIS — M25519 Pain in unspecified shoulder: Secondary | ICD-10-CM | POA: Diagnosis not present

## 2021-01-11 DIAGNOSIS — M47812 Spondylosis without myelopathy or radiculopathy, cervical region: Secondary | ICD-10-CM | POA: Diagnosis not present

## 2021-01-11 LAB — CBC WITH DIFFERENTIAL/PLATELET
Abs Immature Granulocytes: 0.06 10*3/uL (ref 0.00–0.07)
Basophils Absolute: 0.1 10*3/uL (ref 0.0–0.1)
Basophils Relative: 1 %
Eosinophils Absolute: 1 10*3/uL — ABNORMAL HIGH (ref 0.0–0.5)
Eosinophils Relative: 6 %
HCT: 43 % (ref 36.0–46.0)
Hemoglobin: 14.1 g/dL (ref 12.0–15.0)
Immature Granulocytes: 0 %
Lymphocytes Relative: 17 %
Lymphs Abs: 2.7 10*3/uL (ref 0.7–4.0)
MCH: 31.3 pg (ref 26.0–34.0)
MCHC: 32.8 g/dL (ref 30.0–36.0)
MCV: 95.3 fL (ref 80.0–100.0)
Monocytes Absolute: 0.7 10*3/uL (ref 0.1–1.0)
Monocytes Relative: 5 %
Neutro Abs: 10.7 10*3/uL — ABNORMAL HIGH (ref 1.7–7.7)
Neutrophils Relative %: 71 %
Platelets: 275 10*3/uL (ref 150–400)
RBC: 4.51 MIL/uL (ref 3.87–5.11)
RDW: 12.3 % (ref 11.5–15.5)
WBC: 15.2 10*3/uL — ABNORMAL HIGH (ref 4.0–10.5)
nRBC: 0 % (ref 0.0–0.2)

## 2021-01-11 LAB — COMPREHENSIVE METABOLIC PANEL
ALT: 41 U/L (ref 0–44)
AST: 21 U/L (ref 15–41)
Albumin: 3.8 g/dL (ref 3.5–5.0)
Alkaline Phosphatase: 76 U/L (ref 38–126)
Anion gap: 10 (ref 5–15)
BUN: 18 mg/dL (ref 6–20)
CO2: 23 mmol/L (ref 22–32)
Calcium: 8.2 mg/dL — ABNORMAL LOW (ref 8.9–10.3)
Chloride: 102 mmol/L (ref 98–111)
Creatinine, Ser: 0.63 mg/dL (ref 0.44–1.00)
GFR, Estimated: >60 mL/min (ref >60)
Glucose, Bld: 157 mg/dL — ABNORMAL HIGH (ref 70–99)
Potassium: 4.9 mmol/L (ref 3.5–5.1)
Sodium: 135 mmol/L (ref 135–145)
Total Bilirubin: 0.4 mg/dL (ref 0.3–1.2)
Total Protein: 6.5 g/dL (ref 6.5–8.1)

## 2021-01-11 LAB — TROPONIN I (HIGH SENSITIVITY)
Troponin I (High Sensitivity): 3 ng/L (ref <18)
Troponin I (High Sensitivity): 3 ng/L (ref <18)

## 2021-01-11 MED ORDER — IOHEXOL 350 MG/ML SOLN
100.0000 mL | Freq: Once | INTRAVENOUS | Status: AC | PRN
  Administered 2021-01-11: 100 mL via INTRAVENOUS

## 2021-01-11 MED ORDER — HYDROCODONE-ACETAMINOPHEN 5-325 MG PO TABS: 1.0000 | ORAL_TABLET | Freq: Four times a day (QID) | ORAL | 0 refills | Status: DC | PRN

## 2021-01-11 MED ORDER — PREDNISONE 10 MG PO TABS: 20.0000 mg | ORAL_TABLET | Freq: Every day | ORAL | 0 refills | Status: DC

## 2021-01-11 MED ORDER — SODIUM CHLORIDE 0.9 % IV BOLUS (SEPSIS)
1000.0000 mL | Freq: Once | INTRAVENOUS | Status: AC
  Administered 2021-01-11: 1000 mL via INTRAVENOUS

## 2021-01-11 NOTE — ED Provider Notes (Addendum)
[facility] Provider Note   CSN: [id number] Arrival date & time: 01/11/21  1952     History Chief Complaint  Patient presents with  . Shoulder Pain    Left arm numbness and swelling    Savannah Pena is a 60 y.o. female.  Patient complains of pain in her trapezius area with numbness and pain rating down her left arm  The history is provided by the patient and medical records. No language interpreter was used.  Shoulder Pain Upper extremity pain location: Left trapeze. Pain details:    Quality:  Aching   Radiates to: Left arm.   Severity:  Moderate   Onset quality:  Sudden   Timing:  Constant Associated symptoms: no back pain and no fatigue        Past Medical History:  Diagnosis Date  . Anxiety   . Arthritis   . Asthma   . Attention deficit disorder (ADD)   . COPD (chronic obstructive pulmonary disease) (HCC)   . Diabetes mellitus without complication (HCC)   . Dyspnea    with exertion  . GERD (gastroesophageal reflux disease)   . History of kidney stones   . Hypertension   . Kidney stone   . Major depression   . Osteoporosis   . Seizures (HCC)    last seizure 23 years ago  . Sleep apnea    has C-PAP, but does not currently use  . Takotsubo cardiomyopathy    a. EF 35-40% by echo in 03/2018 with cath showing mild, nonobstructive CAD.     Patient Active Problem List   Diagnosis Date Noted  . DDD (degenerative disc disease), cervical 07/19/2018  . Neck pain 07/19/2018  . Osteopenia 07/19/2018  . Takotsubo cardiomyopathy   . Pure hypercholesterolemia   . Cardiomyopathy, unspecified (HCC) 03/17/2018  . NSTEMI (non-ST elevated myocardial infarction) (HCC) 03/17/2018  . NICM (nonischemic cardiomyopathy) (HCC) 03/17/2018  . Seizure (HCC) 03/16/2018  . COPD (chronic obstructive pulmonary disease) (HCC) 03/16/2018  . Elevated troponin   . Diabetes mellitus without complication (HCC)   . Attention deficit disorder (ADD)   . Diabetes (HCC)  04/08/2017  . Asthma 04/08/2017  . Macular degeneration 04/08/2017  . Chronic back pain 04/08/2017  . MDD (major depressive disorder), recurrent episode, moderate (HCC) 04/08/2017  . PTSD (post-traumatic stress disorder) 04/08/2017  . Hyperoxaluria 03/29/2017  . Ureteral stone 11/06/2016    Past Surgical History:  Procedure Laterality Date  . CESAREAN SECTION    . CYSTOSCOPY WITH RETROGRADE PYELOGRAM, URETEROSCOPY AND STENT PLACEMENT Right 12/05/2016   Procedure: CYSTOSCOPY WITH RETROGRADE PYELOGRAM, URETEROSCOPY AND STENT PLACEMENT;  Surgeon: Sebastian Acheheodore Manny, MD;  Location: [facility];  Service: Urology;  Laterality: Right;  . CYSTOSCOPY WITH STENT PLACEMENT Right 11/06/2016   Procedure: CYSTOSCOPY WITH STENT PLACEMENT;  Surgeon: Bjorn PippinWrenn, John, MD;  Location: [facility] OR[facility];  Service: Urology;  Laterality: Right;  . FLEXOR TENDON REPAIR Right    foot  . KIDNEY STONE SURGERY    . LEFT HEART CATH AND CORONARY ANGIOGRAPHY N/A 03/17/2018   Procedure: LEFT HEART CATH AND CORONARY ANGIOGRAPHY;  Surgeon: Kathleene HazelMcAlhany, Christopher D, MD;  Location: [facility];  Service: Cardiovascular;  Laterality: N/A;     OB History   No obstetric history on file.     Family History  Problem Relation Age of Onset  . Prostate cancer Father   . Diabetes Father     Social History   Tobacco Use  . Smoking status: Former Smoker  Types: Cigarettes    Quit date: 11/12/2016    Years since quitting: 4.1  . Smokeless tobacco: Former Neurosurgeon  . Tobacco comment: 04-08-2017 per pt stopped in Jan 2018  Vaping Use  . Vaping Use: Never used  Substance Use Topics  . Alcohol use: Yes    Comment: occassional, 04-08-2017 per pt occas.  . Drug use: No    Comment: 04-08-2017 per pt no     Home Medications Prior to Admission medications   Medication Sig Start Date End Date Taking? Authorizing Provider  HYDROcodone-acetaminophen (NORCO/VICODIN) 5-325 MG tablet Take 1 tablet by mouth every 6 (six) hours as needed. 01/11/21   Yes Bethann Berkshire, MD  predniSONE (DELTASONE) 10 MG tablet Take 2 tablets (20 mg total) by mouth daily. 01/11/21  Yes Bethann Berkshire, MD  albuterol (VENTOLIN HFA) 108 (90 Base) MCG/ACT inhaler Inhale 1-2 puffs into the lungs every 6 (six) hours as needed for wheezing or shortness of breath. 05/18/20   Wurst, [place], PA-C  aspirin EC 81 MG EC tablet Take 1 tablet (81 mg total) by mouth daily. 03/18/18   Alwyn Ren, MD  atorvastatin (LIPITOR) 40 MG tablet Take 1 tablet (40 mg total) by mouth daily at 6 PM. 04/08/18   Strader, [place] M, PA-C  azithromycin (ZITHROMAX) 250 MG tablet Take 1 tablet (250 mg total) by mouth daily. Take first 2 tablets together, then 1 every day until finished. 05/18/20   Wurst, [place], PA-C  buPROPion (WELLBUTRIN XL) 150 MG 24 hr tablet Take 150 mg by mouth daily. 04/27/18   [provider]  cetirizine (ZYRTEC) 10 MG tablet Take 10 mg by mouth daily.    [provider]  D3-50 50000 units capsule Take 50,000 Units by mouth once a week. 04/09/18   [provider]  desvenlafaxine (PRISTIQ) 50 MG 24 hr tablet Take 50 mg by mouth daily. 08/28/20   [provider]  diclofenac (VOLTAREN) 50 MG EC tablet Take 50 mg by mouth 2 (two) times daily. 03/19/18   [provider]  DULoxetine (CYMBALTA) 60 MG capsule Take 60 mg by mouth daily.    [provider]  Fluticasone Propionate HFA (FLOVENT HFA IN) Inhale into the lungs.    [provider]  gabapentin (NEURONTIN) 300 MG capsule Take 300 mg by mouth 2 (two) times daily. 03/19/18   [provider]  lisinopril (PRINIVIL,ZESTRIL) 10 MG tablet Take 1 tablet (10 mg total) by mouth daily. 04/08/18   Strader, Lennart Pall, PA-C  metFORMIN (GLUCOPHAGE) 500 MG tablet Take 1 tablet (500 mg total) by mouth 2 (two) times daily with a meal. Start 03/20/2018 03/18/18   Alwyn Ren, MD  methocarbamol (ROBAXIN) 750 MG tablet  05/03/19   [provider]  metoprolol  succinate (TOPROL XL) 25 MG 24 hr tablet Take 1 tablet (25 mg total) by mouth daily. 04/08/18   Strader, Lennart Pall, PA-C  montelukast (SINGULAIR) 10 MG tablet Take 10 mg by mouth daily.    [provider]  Multiple Vitamins-Minerals (PRESERVISION AREDS 2 PO) Take by mouth 2 (two) times daily.    [provider]  Omega 3 1000 MG CAPS Take by mouth 3 (three) times daily.    [provider]  omeprazole (PRILOSEC) 40 MG capsule Take 40 mg by mouth daily.    [provider]  oxybutynin (DITROPAN) 5 MG tablet Take 1 tablet (5 mg total) by mouth every 8 (eight) hours as needed for bladder spasms. And stent discomfort.  11/25/16   Sebastian Ache, MD  phentermine (ADIPEX-P) 37.5 MG tablet  09/05/19   [provider]  phenytoin (DILANTIN) 200 MG ER capsule Take 1 capsule (200 mg total) by mouth 2 (two) times daily. 03/18/18   Alwyn Ren, MD  Tiotropium Bromide-Olodaterol (STIOLTO RESPIMAT) 2.5-2.5 MCG/ACT AERS Inhale into the lungs.    [provider]  tiZANidine (ZANAFLEX) 4 MG tablet Take 4 mg by mouth every 6 (six) hours as needed for muscle spasms.    [provider]  Vitamin D, Ergocalciferol, (DRISDOL) 50000 units CAPS capsule Take 50,000 Units by mouth every 7 (seven) days.    [provider]  VYVANSE 70 MG capsule Take 70 mg by mouth at bedtime. 09/07/20   [provider]    Allergies    Fruit extracts  Review of Systems   Review of Systems  Constitutional: Negative for appetite change and fatigue.  HENT: Negative for congestion, ear discharge and sinus pressure.   Eyes: Negative for discharge.  Respiratory: Negative for cough.   Cardiovascular: Negative for chest pain.  Gastrointestinal: Negative for abdominal pain and diarrhea.  Genitourinary: Negative for frequency and hematuria.  Musculoskeletal: Negative for back pain.       Left trapezius pain  Skin: Negative for rash.  Neurological: Negative for  seizures and headaches.  Psychiatric/Behavioral: Negative for hallucinations.    Physical Exam Updated Vital Signs BP 115/69   Pulse 67   Temp (!) 97.5 F (36.4 C) (Oral)   Resp 18   Ht 5\' 4"  (1.626 m)   Wt 95.3 kg   LMP 12/06/2011 (Approximate)   SpO2 97%   BMI 36.05 kg/m   Physical Exam Nursing note reviewed.  Constitutional:      Appearance: She is well-developed.  HENT:     Head: Normocephalic.     Nose: Nose normal.  Eyes:     General: No scleral icterus.    Conjunctiva/sclera: Conjunctivae normal.  Neck:     Thyroid: No thyromegaly.  Cardiovascular:     Rate and Rhythm: Normal rate and regular rhythm.     Heart sounds: No murmur heard. No friction rub. No gallop.   Pulmonary:     Breath sounds: No stridor. No wheezing or rales.  Chest:     Chest wall: No tenderness.  Abdominal:     General: There is no distension.     Tenderness: There is no abdominal tenderness. There is no rebound.  Musculoskeletal:        General: Normal range of motion.     Cervical back: Neck supple.     Comments: Tenderness to left trapezius  Lymphadenopathy:     Cervical: No cervical adenopathy.  Skin:    Findings: No erythema or rash.  Neurological:     Mental Status: She is alert and oriented to person, place, and time.     Motor: No abnormal muscle tone.     Coordination: Coordination normal.  Psychiatric:        Behavior: Behavior normal.     ED Results / Procedures / Treatments   Labs (all labs ordered are listed, but only abnormal results are displayed) Labs Reviewed  CBC WITH DIFFERENTIAL/PLATELET - Abnormal; Notable for the following components:      Result Value   WBC 15.2 (*)    Neutro Abs 10.7 (*)    Eosinophils Absolute 1.0 (*)    All other components within normal limits  COMPREHENSIVE METABOLIC PANEL - Abnormal; Notable for the  following components:   Glucose, Bld 157 (*)    Calcium 8.2 (*)    All other components within normal limits  TROPONIN I (HIGH  SENSITIVITY)  TROPONIN I (HIGH SENSITIVITY)    EKG None  Radiology CT Cervical Spine Wo Contrast  Result Date: 01/11/2021 CLINICAL DATA:  Shoulder pain EXAM: CT CERVICAL SPINE WITHOUT CONTRAST TECHNIQUE: Multidetector CT imaging of the cervical spine was performed without intravenous contrast. Multiplanar CT image reconstructions were also generated. COMPARISON:  None. FINDINGS: Alignment: Straightening of the cervical spine. No subluxation. Facet alignment within normal limits Skull base and vertebrae: No acute fracture. No primary bone lesion or focal pathologic process. Soft tissues and spinal canal: No prevertebral fluid or swelling. No visible canal hematoma. Disc levels: Diffuse degenerative changes with moderate disc space narrowing at C4-C5 and marked disc space narrowing and degenerative change at C5-C6 and C6-C7. Small posterior disc osteophyte complex at C5-C6 and C6-C7. Facet degenerative changes at multiple levels. Upper chest: Negative. Other: Multiple subcentimeter lymph nodes within the neck. Small right suboccipital nodule presumably a lymph node IMPRESSION: Straightening of the cervical spine with degenerative changes. No acute osseous abnormality. Electronically Signed   By: Jasmine Pang M.D.   On: 01/11/2021 22:50   DG Chest Port 1 View  Result Date: 01/11/2021 CLINICAL DATA:  Weakness EXAM: PORTABLE CHEST 1 VIEW COMPARISON:  05/18/2020 FINDINGS: No focal opacity or pleural effusion. Stable cardiomediastinal silhouette with aortic atherosclerosis. No pneumothorax IMPRESSION: No active disease. Electronically Signed   By: Jasmine Pang M.D.   On: 01/11/2021 21:24   CT Angio Chest/Abd/Pel for Dissection W and/or Wo Contrast  Result Date: 01/11/2021 CLINICAL DATA:  Chest pain or back pain, aortic dissection suspected. Pt reports left shoulder pain started a couple days ago, today pt reports increased pain with numbness and swelling to left arm. Pt ambulatory, alert and oriented and  able to move all extremities. EXAM: CT ANGIOGRAPHY CHEST, ABDOMEN AND PELVIS TECHNIQUE: Non-contrast CT of the chest was initially obtained. Multidetector CT imaging through the chest, abdomen and pelvis was performed using the standard protocol during bolus administration of intravenous contrast. Multiplanar reconstructed images and MIPs were obtained and reviewed to evaluate the vascular anatomy. CONTRAST:  OMNIPAQUE IOHEXOL 350 MG/ML SOLN COMPARISON:  CT renal 11/05/2016 FINDINGS: CTA CHEST FINDINGS Cardiovascular: Preferential opacification of the thoracic aorta. No evidence of thoracic aortic aneurysm or dissection. Trace atherosclerotic plaque of the thoracic aorta. No definite coronary artery calcifications. Normal heart size. No significant pericardial effusion. Mediastinum/Nodes: No enlarged mediastinal, hilar, or axillary lymph nodes. Thyroid gland, trachea, and esophagus demonstrate no significant findings. Lungs/Pleura: Bilateral lower lobe subsegmental atelectasis. No focal consolidation. No pulmonary nodule. No pulmonary mass. No pleural effusion. No pneumothorax. Musculoskeletal: No chest wall abnormality. No suspicious lytic or blastic osseous lesions. No acute displaced fracture. Multilevel degenerative changes of the spine. CTA ABDOMEN AND PELVIS FINDINGS VASCULAR Aorta: Mild atherosclerotic plaque. Normal caliber aorta without aneurysm, dissection, vasculitis or significant stenosis. Celiac: Patent without evidence of aneurysm, dissection, vasculitis or significant stenosis. SMA: Patent without evidence of aneurysm, dissection, vasculitis or significant stenosis. Renals: Mild atherosclerotic plaque at the origin of the left renal artery. Both renal arteries are patent without evidence of aneurysm, dissection, vasculitis, fibromuscular dysplasia or significant stenosis. IMA: Patent without evidence of aneurysm, dissection, vasculitis or significant stenosis. Inflow: Mild atherosclerotic  plaque. Patent without evidence of aneurysm, dissection, vasculitis or significant stenosis. Veins: No obvious venous abnormality within the limitations of this arterial phase study. NON-VASCULAR  Hepatobiliary: Hepatomegaly. No focal liver abnormality. No gallstones, gallbladder wall thickening, or pericholecystic fluid. No biliary dilatation. Pancreas: No focal lesion. Normal pancreatic contour. No surrounding inflammatory changes. No main pancreatic ductal dilatation. Spleen: Normal in size without focal abnormality. Adrenals/Urinary Tract: No adrenal nodule bilaterally. Bilateral kidneys enhance symmetrically. Right renal cortical scarring. No hydronephrosis. No hydroureter. The urinary bladder is unremarkable. Stomach/Bowel: Stomach is within normal limits. Slightly prominent bowel wall of the duodenum and jejunum with no associated inflammatory changes. No pneumatosis. No surrounding fat stranding. No small bowel dilatation. No evidence of large bowel wall thickening or dilatation. Scattered colonic diverticulosis with no acute diverticulitis. Appendix appears normal. Lymphatic: No lymphadenopathy. Reproductive: The uterus and bilateral ovaries are unremarkable. Other: No intraperitoneal free fluid. No intraperitoneal free gas. No organized fluid collection. Musculoskeletal: Atrophic versus lipomatous lesion of the right flank abdominal musculature (5:134). No suspicious lytic or blastic osseous lesions. No acute displaced fracture. Multilevel degenerative changes of the spine. Review of the MIP images confirms the above findings. IMPRESSION: 1. No acute vascular abnormality. 2. No definite acute intrathoracic, intra-abdominal, intrapelvic abnormality. 3. Prominence of the bowel wall of the duodenum and jejunum likely due to under-distension. Differential diagnosis includes early/mild enteritis. 4. Scattered colonic diverticulosis with no acute diverticulitis. 5. Hepatomegaly. 6.  Aortic Atherosclerosis  (ICD10-I70.0) - mild. Electronically Signed   By: Tish Frederickson M.D.   On: 01/11/2021 23:01    Procedures Procedures   Medications Ordered in ED Medications  sodium chloride 0.9 % bolus 1,000 mL (1,000 mLs Intravenous New Bag/Given 01/11/21 2128)  iohexol (OMNIPAQUE) 350 MG/ML injection 100 mL (100 mLs Intravenous Contrast Given 01/11/21 2231)    ED Course  I have reviewed the triage vital signs and the nursing notes.  Pertinent labs & imaging results that were available during my care of the patient were reviewed by me and considered in my medical decision making (see chart for details).    MDM Rules/Calculators/A&P                         Pt with cervical radiculopathy.  Pt will be treated with prednisone and vicodin and follow up with her md next week  Final Clinical Impression(s) / ED Diagnoses Final diagnoses:  Radiculopathy, unspecified spinal region    Rx / DC Orders ED Discharge Orders         Ordered    predniSONE (DELTASONE) 10 MG tablet  Daily        01/11/21 2323    HYDROcodone-acetaminophen (NORCO/VICODIN) 5-325 MG tablet  Every 6 hours PRN        01/11/21 2323           Bethann Berkshire, MD 01/17/21 1005    Bethann Berkshire, MD 01/20/21 1219

## 2021-01-11 NOTE — Discharge Instructions (Addendum)
Follow up with your md next week for recheck °

## 2021-01-11 NOTE — ED Triage Notes (Addendum)
Pt reports left shoulder pain started a couple days ago, today pt reports increased pain with numbness and swelling to left arm. Pt ambulatory, alert and oriented and able to move all extremities. Pt appears drowsy in triage- reports taking trazodone prior to arrival.

## 2021-01-21 ENCOUNTER — Ambulatory Visit (HOSPITAL_COMMUNITY)
Admission: RE | Admit: 2021-01-21 | Discharge: 2021-01-21 | Disposition: A | Discharge status: Home / Self Care | Payer: Medicaid Other | Source: Ambulatory Visit | Attending: Family Medicine | Admitting: Family Medicine

## 2021-01-21 ENCOUNTER — Other Ambulatory Visit (HOSPITAL_COMMUNITY): Payer: Self-pay | Admitting: Family Medicine

## 2021-01-21 DIAGNOSIS — W19XXXA Unspecified fall, initial encounter: Secondary | ICD-10-CM | POA: Diagnosis not present

## 2021-01-21 DIAGNOSIS — M25562 Pain in left knee: Secondary | ICD-10-CM | POA: Diagnosis not present

## 2021-01-21 DIAGNOSIS — M79671 Pain in right foot: Secondary | ICD-10-CM | POA: Insufficient documentation

## 2021-01-21 DIAGNOSIS — M25512 Pain in left shoulder: Secondary | ICD-10-CM | POA: Diagnosis not present

## 2021-01-21 DIAGNOSIS — R296 Repeated falls: Secondary | ICD-10-CM | POA: Diagnosis not present

## 2021-01-21 DIAGNOSIS — M25561 Pain in right knee: Secondary | ICD-10-CM | POA: Diagnosis not present

## 2021-01-21 DIAGNOSIS — M79672 Pain in left foot: Secondary | ICD-10-CM | POA: Diagnosis not present

## 2021-01-21 DIAGNOSIS — M79642 Pain in left hand: Secondary | ICD-10-CM | POA: Insufficient documentation

## 2021-01-21 DIAGNOSIS — Y929 Unspecified place or not applicable: Secondary | ICD-10-CM | POA: Insufficient documentation

## 2021-01-21 DIAGNOSIS — Y939 Activity, unspecified: Secondary | ICD-10-CM | POA: Diagnosis not present

## 2021-01-30 DIAGNOSIS — Z0120 Encounter for dental examination and cleaning without abnormal findings: Secondary | ICD-10-CM | POA: Diagnosis not present

## 2021-02-25 ENCOUNTER — Ambulatory Visit: Payer: Medicaid Other | Admitting: Podiatry

## 2021-02-25 ENCOUNTER — Other Ambulatory Visit: Payer: Self-pay

## 2021-02-25 DIAGNOSIS — L989 Disorder of the skin and subcutaneous tissue, unspecified: Secondary | ICD-10-CM

## 2021-02-25 DIAGNOSIS — L910 Hypertrophic scar: Secondary | ICD-10-CM

## 2021-02-25 NOTE — Progress Notes (Signed)
   Subjective: 60 y.o. female presenting to the office today for evaluation of symptomatic scar to the plantar aspect of the right foot.  Patient had surgery on the plantar aspect of the right foot when she was a child and she stepped on a beer tab.  She developed a scar to the area and has been painful ever since.  She presents for further treatment evaluation   Past Medical History:  Diagnosis Date  . Anxiety   . Arthritis   . Asthma   . Attention deficit disorder (ADD)   . COPD (chronic obstructive pulmonary disease) (HCC)   . Diabetes mellitus without complication (HCC)   . Dyspnea    with exertion  . GERD (gastroesophageal reflux disease)   . History of kidney stones   . Hypertension   . Kidney stone   . Major depression   . Osteoporosis   . Seizures (HCC)    last seizure 23 years ago  . Sleep apnea    has C-PAP, but does not currently use  . Takotsubo cardiomyopathy    a. EF 35-40% by echo in 03/2018 with cath showing mild, nonobstructive CAD.      Objective:  Physical Exam General: Alert and oriented x3 in no acute distress  Dermatology: Hyperkeratotic lesion(s) present on the plantar aspect of the right foot. Pain on palpation with a central nucleated core noted. Skin is warm, dry and supple bilateral lower extremities. Negative for open lesions or macerations.  Vascular: Palpable pedal pulses bilaterally. No edema or erythema noted. Capillary refill within normal limits.  Neurological: Epicritic and protective threshold grossly intact bilaterally.   Musculoskeletal Exam: Pain on palpation at the keratotic lesion(s) noted. Range of motion within normal limits bilateral. Muscle strength 5/5 in all groups bilateral.  Assessment: 1.  Hypertrophic scar/callus right plantar foot   Plan of Care:  1. Patient evaluated 2. Excisional debridement of keratoic lesion(s) using a chisel blade was performed without incident.  3. Dressed area with light dressing. 4.   Recommend OTC corn and callus remover  5.  Patient is to return to the clinic PRN.   Felecia Shelling, DPM [facility]  Dr. Felecia Shelling, DPM    [address] [name], [place] [address]                Office [phone]  Fax [phone]

## 2021-03-13 DIAGNOSIS — E782 Mixed hyperlipidemia: Secondary | ICD-10-CM | POA: Diagnosis not present

## 2021-03-13 DIAGNOSIS — G4733 Obstructive sleep apnea (adult) (pediatric): Secondary | ICD-10-CM | POA: Diagnosis not present

## 2021-03-13 DIAGNOSIS — E1165 Type 2 diabetes mellitus with hyperglycemia: Secondary | ICD-10-CM | POA: Diagnosis not present

## 2021-03-13 DIAGNOSIS — E785 Hyperlipidemia, unspecified: Secondary | ICD-10-CM | POA: Diagnosis not present

## 2021-03-13 DIAGNOSIS — E1169 Type 2 diabetes mellitus with other specified complication: Secondary | ICD-10-CM | POA: Diagnosis not present

## 2021-03-13 DIAGNOSIS — E119 Type 2 diabetes mellitus without complications: Secondary | ICD-10-CM | POA: Diagnosis not present

## 2021-03-13 DIAGNOSIS — J449 Chronic obstructive pulmonary disease, unspecified: Secondary | ICD-10-CM | POA: Diagnosis not present

## 2021-03-13 DIAGNOSIS — D72829 Elevated white blood cell count, unspecified: Secondary | ICD-10-CM | POA: Diagnosis not present

## 2021-03-13 DIAGNOSIS — E1140 Type 2 diabetes mellitus with diabetic neuropathy, unspecified: Secondary | ICD-10-CM | POA: Diagnosis not present

## 2021-03-13 DIAGNOSIS — E11621 Type 2 diabetes mellitus with foot ulcer: Secondary | ICD-10-CM | POA: Diagnosis not present

## 2021-03-13 DIAGNOSIS — I10 Essential (primary) hypertension: Secondary | ICD-10-CM | POA: Diagnosis not present

## 2021-03-21 DIAGNOSIS — H524 Presbyopia: Secondary | ICD-10-CM | POA: Diagnosis not present

## 2021-03-21 DIAGNOSIS — H5213 Myopia, bilateral: Secondary | ICD-10-CM | POA: Diagnosis not present

## 2021-04-12 ENCOUNTER — Other Ambulatory Visit: Payer: Self-pay

## 2021-04-12 ENCOUNTER — Ambulatory Visit (INDEPENDENT_AMBULATORY_CARE_PROVIDER_SITE_OTHER): Payer: Medicaid Other | Admitting: Allergy & Immunology

## 2021-04-12 ENCOUNTER — Encounter: Payer: Self-pay | Admitting: Allergy & Immunology

## 2021-04-12 VITALS — BP 116/78 | HR 85 | Temp 97.1°F | Resp 16 | Ht 64.0 in | Wt 210.4 lb

## 2021-04-12 DIAGNOSIS — L259 Unspecified contact dermatitis, unspecified cause: Secondary | ICD-10-CM

## 2021-04-12 DIAGNOSIS — J302 Other seasonal allergic rhinitis: Secondary | ICD-10-CM | POA: Diagnosis not present

## 2021-04-12 DIAGNOSIS — J449 Chronic obstructive pulmonary disease, unspecified: Secondary | ICD-10-CM | POA: Diagnosis not present

## 2021-04-12 DIAGNOSIS — J3089 Other allergic rhinitis: Secondary | ICD-10-CM | POA: Diagnosis not present

## 2021-04-12 MED ORDER — TRIAMCINOLONE ACETONIDE 0.1 % EX OINT: 1.0000 "application " | TOPICAL_OINTMENT | Freq: Two times a day (BID) | CUTANEOUS | 3 refills | Status: AC

## 2021-04-12 MED ORDER — LEVOCETIRIZINE DIHYDROCHLORIDE 5 MG PO TABS: 5.0000 mg | ORAL_TABLET | Freq: Every evening | ORAL | 5 refills | Status: DC

## 2021-04-12 MED ORDER — SPACER/AERO-HOLDING CHAMBERS DEVI: 1.0000 | Freq: Every day | 1 refills | Status: DC | PRN

## 2021-04-12 MED ORDER — MONTELUKAST SODIUM 10 MG PO TABS
10.0000 mg | ORAL_TABLET | Freq: Every day | ORAL | 5 refills | Status: DC
Start: 2021-04-12 — End: 2021-08-26

## 2021-04-12 MED ORDER — FLUTICASONE PROPIONATE 50 MCG/ACT NA SUSP: 1.0000 | Freq: Every day | NASAL | 5 refills | Status: DC

## 2021-04-12 NOTE — Progress Notes (Signed)
NEW PATIENT  Date of Service/Encounter:  04/12/21  Consult requested by: Benita Stabile, MD   Assessment:   Asthma-COPD overlap syndrome - poorly controlled  Seasonal and perennial allergic rhinitis (grasses, ragweed, weeds, trees, indoor molds, outdoor molds, dust mites, cat and dog)  Contact dermatitis - consider patch testing in the future  Elevated absolute eosinophil count (1000 in March 2022)  Disabled status  Plan/Recommendations:   1. Asthma-COPD overlap syndrome - Lung testing was in the 56% range, but it is improved with the Xopenex treatment. - We are going to stop the Stiolto and start Breztri 2 puffs twice daily with a spacer. - Breztri contains 3 medications to help keep your lung inflammation under control and keep your lungs open. - You did have an elevated absolute eosinophil count in March 2022. - Eosinophils are one of your white blood cells that do not do anything useful but instead make asthma and allergies worse by releasing chemicals. - If the [name] is not cutting it, we can start an injectable medicine to help control your breathing, decrease her need for prednisone, and increase her lung function. - Spacer use reviewed. - Daily controller medication(s): Breztri 2 puffs twice daily with spacer + Singulair (montelukast) 10 mg daily - Prior to physical activity: albuterol 2 puffs 10-15 minutes before physical activity. - Rescue medications: albuterol 4 puffs every 4-6 hours as needed or albuterol nebulizer one vial every 4-6 hours as needed - Asthma control goals:  * Full participation in all desired activities (may need albuterol before activity) * Albuterol use two time or less a week on average (not counting use with activity) * Cough interfering with sleep two time or less a month * Oral steroids no more than once a year * No hospitalizations  2. Seasonal and perennial allergic rhinitis - Testing today showed: grasses, ragweed, weeds, trees,  indoor molds, outdoor molds, dust mites, cat and dog - Copy of test results provided.  - Avoidance measures provided. - Stop taking: Zyrtec - Continue with: Singulair (montelukast) 10 mg daily - Start taking: Xyzal (levocetirizine) 5mg  tablet once daily and Flonase (fluticasone) one spray per nostril daily - You can use an extra dose of the antihistamine, if needed, for breakthrough symptoms.  - Consider nasal saline rinses 1-2 times daily to remove allergens from the nasal cavities as well as help with mucous clearance (this is especially helpful to do before the nasal sprays are given) - Consider allergy shots as a means of long-term control. - Allergy shots "re-train" and "reset" the immune system to ignore environmental allergens and decrease the resulting immune response to those allergens (sneezing, itchy watery eyes, runny nose, nasal congestion, etc).    - Allergy shots improve symptoms in 75-85% of patients.  - We can discuss more at the next appointment if the medications are not working for you.  3. Contact dermatitis - I am not sure what is causing the rash, but lets try treating it with a more aggressive steroid. - Start triamcinolone 0.1% ointment applied twice daily as needed. - We could do test testing in the future if this continues to be an issue.  4. Follow up in 8 weeks or earlier if needed.    This note in its entirety was forwarded to the Provider who requested this consultation.  Subjective:   Savannah Pena is a 60 y.o. female presenting today for evaluation of  Chief Complaint  Patient presents with  . Asthma    Savannah  Pena has a history of the following: Patient Active Problem List   Diagnosis Date Noted  . DDD (degenerative disc disease), cervical 07/19/2018  . Neck pain 07/19/2018  . Osteopenia 07/19/2018  . Takotsubo cardiomyopathy   . Pure hypercholesterolemia   . Cardiomyopathy, unspecified (HCC) 03/17/2018  . NSTEMI (non-ST elevated myocardial  infarction) (HCC) 03/17/2018  . NICM (nonischemic cardiomyopathy) (HCC) 03/17/2018  . Seizure (HCC) 03/16/2018  . COPD (chronic obstructive pulmonary disease) (HCC) 03/16/2018  . Elevated troponin   . Diabetes mellitus without complication (HCC)   . Attention deficit disorder (ADD)   . Diabetes (HCC) 04/08/2017  . Asthma 04/08/2017  . Macular degeneration 04/08/2017  . Chronic back pain 04/08/2017  . MDD (major depressive disorder), recurrent episode, moderate (HCC) 04/08/2017  . PTSD (post-traumatic stress disorder) 04/08/2017  . Hyperoxaluria 03/29/2017  . Ureteral stone 11/06/2016    History obtained from: chart review and patient.  [name] was referred by Benita Stabile, MD.     Savannah Pena is a 60 y.o. female presenting for an evaluation of asthma and allergies.   She started having issues with breathing around Christmas 2021. She tells me that her significant other is an alcoholic and bipolar.    Asthma/Respiratory Symptom History: She carries a diagnosis of COPD. She is currently on albuterol and DuoNebs as needed in combination wiht Stiolto once daily. She never really felt a difference with the Stiolto. She reports that she is always tight and always short of breath.  There was discussion of starting Xolair when she lived in [place].   She has been on Advair (stopped due to her being on a beta blocker, per the patient). She has been on Breo. She is unsure if that worked. She has been on Symbicort which she did not think worked well at the time. She was never on [facility] or Trelegy to her knowledge.   Life started falling apart in 2011 or so. She was in the hospital 15 times over two years. This was in [place]. She then moved to [place].  She has some not kind things to say about the place where she lived there.  Then she moved here because it was a cheaper cost of living.  She lives with her significant other, who evidently has taken a turn for the worse.  She seems fairly  fed up with him from what she tells me today.  However, she cannot afford to live by herself.   Allergic Rhinitis Symptom History: She was on allergy shots from childhood until she was 19. They did not do much. She is on Singulair. She does feel worse without it. She has been out of it for two weeks. She is on cetirizine forever.   Eczema Symptom History: She has some red lesions on her arms. She has seen a Armed forces operational officer in [place]. She saw that person once and she was told to use hydrocortisone.   Otherwise, there is no history of other atopic diseases, including food allergies, drug allergies, stinging insect allergies or contact dermatitis. There is no significant infectious history. Vaccinations are up to date.    Past Medical History: Patient Active Problem List   Diagnosis Date Noted  . DDD (degenerative disc disease), cervical 07/19/2018  . Neck pain 07/19/2018  . Osteopenia 07/19/2018  . Takotsubo cardiomyopathy   . Pure hypercholesterolemia   . Cardiomyopathy, unspecified (HCC) 03/17/2018  . NSTEMI (non-ST elevated myocardial infarction) (HCC) 03/17/2018  . NICM (nonischemic cardiomyopathy) (HCC) 03/17/2018  .  Seizure (HCC) 03/16/2018  . COPD (chronic obstructive pulmonary disease) (HCC) 03/16/2018  . Elevated troponin   . Diabetes mellitus without complication (HCC)   . Attention deficit disorder (ADD)   . Diabetes (HCC) 04/08/2017  . Asthma 04/08/2017  . Macular degeneration 04/08/2017  . Chronic back pain 04/08/2017  . MDD (major depressive disorder), recurrent episode, moderate (HCC) 04/08/2017  . PTSD (post-traumatic stress disorder) 04/08/2017  . Hyperoxaluria 03/29/2017  . Ureteral stone 11/06/2016    Medication List:  Allergies as of 04/12/2021      Reactions   Fruit Extracts Nausea And Vomiting   Bananas      Medication List       Accurate as of April 12, 2021  4:12 PM. If you have any questions, ask your nurse or doctor.        acetaminophen 500  MG tablet Commonly known as: TYLENOL Take 500 mg by mouth every 6 (six) hours as needed.   albuterol 108 (90 Base) MCG/ACT inhaler Commonly known as: VENTOLIN HFA Inhale 1-2 puffs into the lungs every 6 (six) hours as needed for wheezing or shortness of breath.   ALPRAZolam 0.5 MG tablet Commonly known as: XANAX Take 0.5 mg by mouth as needed for anxiety.   aspirin 81 MG EC tablet Take 1 tablet (81 mg total) by mouth daily.   atorvastatin 40 MG tablet Commonly known as: LIPITOR Take 1 tablet (40 mg total) by mouth daily at 6 PM.   azithromycin 250 MG tablet Commonly known as: ZITHROMAX Take 1 tablet (250 mg total) by mouth daily. Take first 2 tablets together, then 1 every day until finished.   BENADRYL PO Take by mouth.   buPROPion 150 MG 24 hr tablet Commonly known as: WELLBUTRIN XL Take 150 mg by mouth daily.   cetirizine 10 MG tablet Commonly known as: ZYRTEC Take 10 mg by mouth daily.   D3-50 1.25 MG (50000 UT) capsule Generic drug: Cholecalciferol Take 50,000 Units by mouth once a week.   desvenlafaxine 50 MG 24 hr tablet Commonly known as: PRISTIQ Take 50 mg by mouth daily.   diclofenac 50 MG EC tablet Commonly known as: VOLTAREN Take 50 mg by mouth 2 (two) times daily.   DULoxetine 60 MG capsule Commonly known as: CYMBALTA Take 60 mg by mouth daily.   FLOVENT HFA IN Inhale into the lungs.   gabapentin 300 MG capsule Commonly known as: NEURONTIN Take 300 mg by mouth 2 (two) times daily.   HYDROcodone-acetaminophen 5-325 MG tablet Commonly known as: NORCO/VICODIN Take 1 tablet by mouth every 6 (six) hours as needed.   [employer], dispensing   ipratropium-albuterol 0.5-2.5 (3) MG/3ML Soln Commonly known as: DUONEB Take by nebulization.   lisinopril 10 MG tablet Commonly known as: ZESTRIL Take 1 tablet (10 mg total) by mouth daily.   metFORMIN 1000 MG tablet Commonly known as: GLUCOPHAGE Take 1 tablet by mouth 2 (two) times  daily. What changed: Another medication with the same name was removed. Continue taking this medication, and follow the directions you see here. Changed by: Alfonse Spruce, MD   methocarbamol 750 MG tablet Commonly known as: ROBAXIN   metoprolol succinate 25 MG 24 hr tablet Commonly known as: Toprol XL Take 1 tablet (25 mg total) by mouth daily.   montelukast 10 MG tablet Commonly known as: SINGULAIR Take 10 mg by mouth daily.   Omega 3 1000 MG Caps Take by mouth 3 (three) times daily.   omeprazole 40 MG  capsule Commonly known as: PRILOSEC Take 40 mg by mouth daily.   oxybutynin 5 MG tablet Commonly known as: DITROPAN Take 1 tablet (5 mg total) by mouth every 8 (eight) hours as needed for bladder spasms. And stent discomfort.   phentermine 37.5 MG tablet Commonly known as: ADIPEX-P   phenytoin 200 MG ER capsule Commonly known as: DILANTIN Take 1 capsule (200 mg total) by mouth 2 (two) times daily.   predniSONE 10 MG tablet Commonly known as: DELTASONE Take 2 tablets (20 mg total) by mouth daily.   PRESERVISION AREDS 2 PO Take by mouth 2 (two) times daily.   Tiotropium Bromide-Olodaterol 2.5-2.5 MCG/ACT Aers Inhale into the lungs.   tiZANidine 4 MG tablet Commonly known as: ZANAFLEX Take 4 mg by mouth every 6 (six) hours as needed for muscle spasms.   Trulicity 1.5 MG/0.5ML Sopn Generic drug: Dulaglutide Inject into the skin.   Vitamin D (Ergocalciferol) 1.25 MG (50000 UNIT) Caps capsule Commonly known as: DRISDOL Take 50,000 Units by mouth every 7 (seven) days.   Vyvanse 70 MG capsule Generic drug: lisdexamfetamine Take 70 mg by mouth at bedtime.       Birth History: non-contributory  Developmental History: non-contributory  Past Surgical History: Past Surgical History:  Procedure Laterality Date  . CESAREAN SECTION    . CYSTOSCOPY WITH RETROGRADE PYELOGRAM, URETEROSCOPY AND STENT PLACEMENT Right 12/05/2016   Procedure: CYSTOSCOPY WITH  RETROGRADE PYELOGRAM, URETEROSCOPY AND STENT PLACEMENT;  Surgeon: Sebastian Acheheodore Manny, MD;  Location: [facility] OR[facility];  Service: Urology;  Laterality: Right;  . CYSTOSCOPY WITH STENT PLACEMENT Right 11/06/2016   Procedure: CYSTOSCOPY WITH STENT PLACEMENT;  Surgeon: Bjorn PippinWrenn, John, MD;  Location: [facility] OR[facility];  Service: Urology;  Laterality: Right;  . FLEXOR TENDON REPAIR Right    foot  . KIDNEY STONE SURGERY    . LEFT HEART CATH AND CORONARY ANGIOGRAPHY N/A 03/17/2018   Procedure: LEFT HEART CATH AND CORONARY ANGIOGRAPHY;  Surgeon: Kathleene HazelMcAlhany, Christopher D, MD;  Location: [facility];  Service: Cardiovascular;  Laterality: N/A;     Family History: Family History  Problem Relation Age of Onset  . Prostate cancer Father   . Diabetes Father      Social History: Doloris lives at home with her boyfriend.  She lives in a house that is 59 years old.  There is heel and stick flooring throughout the home.  They have wood heating and window units for cooling.  There are 6 small dogs in the home.  There are no dust mite covers on the bedding or pillows.  There is no tobacco exposure.  She is not exposed to fumes, chemicals, or dust.  She was a smoker from 111977 through 2017.  She smokes around 5/day.   Review of Systems  Constitutional: Negative.  Negative for chills, fever, malaise/fatigue and weight loss.  HENT: Negative.  Negative for congestion, ear discharge, ear pain, sinus pain and sore throat.   Eyes: Negative for pain, discharge and redness.  Respiratory: Positive for sputum production, shortness of breath and wheezing. Negative for cough.   Cardiovascular: Negative.  Negative for chest pain and palpitations.  Gastrointestinal: Negative for abdominal pain, constipation, diarrhea, heartburn, nausea and vomiting.  Skin: Negative.  Negative for itching and rash.  Neurological: Negative for dizziness and headaches.  Endo/Heme/Allergies: Negative for environmental allergies. Does not bruise/bleed easily.        Objective:   Blood pressure 116/78, pulse 85, temperature (!) 97.1 F (36.2 C), temperature source Temporal, resp. rate 16, height  5\' 4"  (1.626 m), weight 210 lb 6.4 oz (95.4 kg), last menstrual period 12/06/2011, SpO2 96 %. Body mass index is 36.12 kg/m.   Physical Exam:   Physical Exam Constitutional:      Appearance: She is well-developed.     Comments: Talkative.   HENT:     Head: Normocephalic and atraumatic.     Right Ear: Tympanic membrane, ear canal and external ear normal. No drainage, swelling or tenderness. Tympanic membrane is not injected, scarred, erythematous, retracted or bulging.     Left Ear: Tympanic membrane, ear canal and external ear normal. No drainage, swelling or tenderness. Tympanic membrane is not injected, scarred, erythematous, retracted or bulging.     Nose: No nasal deformity, septal deviation, mucosal edema or rhinorrhea.     Right Turbinates: Enlarged, swollen and pale.     Left Turbinates: Enlarged, swollen and pale.     Right Sinus: No maxillary sinus tenderness or frontal sinus tenderness.     Left Sinus: No maxillary sinus tenderness or frontal sinus tenderness.     Mouth/Throat:     Mouth: Mucous membranes are not pale and not dry.     Pharynx: Uvula midline.     Comments: Cobblestoning in the posterior oropharynx. Eyes:     General:        Right eye: No discharge.        Left eye: No discharge.     Conjunctiva/sclera: Conjunctivae normal.     Right eye: Right conjunctiva is not injected. No chemosis.    Left eye: Left conjunctiva is not injected. No chemosis.    Pupils: Pupils are equal, round, and reactive to light.  Cardiovascular:     Rate and Rhythm: Normal rate and regular rhythm.     Heart sounds: Normal heart sounds.  Pulmonary:     Effort: Pulmonary effort is normal. No tachypnea, accessory muscle usage or respiratory distress.     Breath sounds: Normal breath sounds. No wheezing, rhonchi or rales.     Comments: No  increased work of breathing noted. Some decreased air movement at the bases.  Chest:     Chest wall: No tenderness.  Abdominal:     Tenderness: There is no abdominal tenderness. There is no guarding or rebound.  Lymphadenopathy:     Head:     Right side of head: No submandibular, tonsillar or occipital adenopathy.     Left side of head: No submandibular, tonsillar or occipital adenopathy.     Cervical: No cervical adenopathy.  Skin:    General: Skin is warm.     Capillary Refill: Capillary refill takes less than 2 seconds.     Coloration: Skin is not pale.     Findings: No abrasion, erythema, petechiae or rash. Rash is not papular, urticarial or vesicular.     Comments: No eczematous or urticarial lesions noted.   Neurological:     Mental Status: She is alert.  Psychiatric:        Behavior: Behavior is cooperative.      Diagnostic studies:    Spirometry: results abnormal (FEV1: 1.41/56%, FVC: 2.37/74%, FEV1/FVC: 59%).    Spirometry consistent with mixed obstructive and restrictive disease. Xopenex four puffs via MDI treatment given in clinic with improvement in FEV1, but not significant per ATS criteria.  Allergy Studies:     Airborne Adult Perc - 04/12/21 1535    Time Antigen Placed 1535    Allergen Manufacturer Waynette Buttery    Location Back  Number of Test 59    1. Control-Buffer 50% Glycerol Negative    2. Control-Histamine 1 mg/ml 2+    3. Albumin saline Negative    4. Bahia 3+    5. [place] 3+    6. Johnson 3+    7. [place] Blue 3+    8. Meadow Fescue 3+    9. Perennial Rye 3+    10. Sweet Vernal 3+    11. Timothy 3+    12. Cocklebur 2+    13. Burweed Marshelder 2+    14. Ragweed, short 2+    15. Ragweed, Giant 2+    16. Plantain,  English 2+    17. Lamb's Quarters 2+    18. Sheep Sorrell 2+    19. Rough Pigweed 2+    20. Marsh Elder, Rough Negative    21. Mugwort, Common 3+    22. Ash mix 3+    23. Birch mix 4+    24. Beech American 3+    25. Box, Elder  3+    26. Cedar, red 2+    27. Cottonwood, Eastern 2+    28. Elm mix 2+    29. Hickory 3+    30. Maple mix 3+    31. Oak, [place] mix 2+    32. Pecan Pollen 3+    33. Pine mix 2+    34. Sycamore Eastern Negative    35. Walnut, Black Pollen Negative    36. Alternaria alternata Negative    37. Cladosporium Herbarum 2+    38. Aspergillus mix 2+    39. Penicillium mix 2+    40. Bipolaris sorokiniana (Helminthosporium) 2+    41. Drechslera spicifera (Curvularia) 2+    42. Mucor plumbeus Negative    43. Fusarium moniliforme 2+    44. Aureobasidium pullulans (pullulara) Negative    45. Rhizopus oryzae Negative    46. Botrytis cinera 2+    47. Epicoccum nigrum 2+    48. Phoma betae 2+    49. Candida Albicans Negative    50. Trichophyton mentagrophytes Negative    51. Mite, D Farinae  5,000 AU/ml 3+    52. Mite, D Pteronyssinus  5,000 AU/ml 3+    53. Cat Hair 10,000 BAU/ml 2+    54.  Dog Epithelia 3+    55. Mixed Feathers 2+    56. Horse Epithelia 2+    57. Cockroach, German Negative    58. Mouse 2+    59. Tobacco Leaf Negative           Allergy testing results were read and interpreted by myself, documented by clinical staff.         Malachi Bonds, MD [facility]

## 2021-04-12 NOTE — Patient Instructions (Addendum)
1. Asthma-COPD overlap syndrome - Lung testing was in the 56% range, but it is improved with the Xopenex treatment to 60%.  - We are going to stop the Stiolto and start Breztri 2 puffs twice daily with a spacer. - Breztri contains 3 medications to help keep your lung inflammation under control and keep your lungs open. - You did have an elevated absolute eosinophil count in March 2022. - Eosinophils are one of your white blood cells that do not do anything useful but instead make asthma and allergies worse by releasing chemicals. - If the [name] is not cutting it, we can start an injectable medicine to help control your breathing, decrease her need for prednisone, and increase her lung function. - Spacer use reviewed. - Daily controller medication(s): Breztri 2 puffs twice daily with spacer + Singulair (montelukast) 10 mg daily - Prior to physical activity: albuterol 2 puffs 10-15 minutes before physical activity. - Rescue medications: albuterol 4 puffs every 4-6 hours as needed or albuterol nebulizer one vial every 4-6 hours as needed - Asthma control goals:  * Full participation in all desired activities (may need albuterol before activity) * Albuterol use two time or less a week on average (not counting use with activity) * Cough interfering with sleep two time or less a month * Oral steroids no more than once a year * No hospitalizations  2. Seasonal and perennial allergic rhinitis - Testing today showed: grasses, ragweed, weeds, trees, indoor molds, outdoor molds, dust mites, cat and dog - Copy of test results provided.  - Avoidance measures provided. - Stop taking: Zyrtec - Continue with: Singulair (montelukast) 10 mg daily - Start taking: Xyzal (levocetirizine) 5mg  tablet once daily and Flonase (fluticasone) one spray per nostril daily - You can use an extra dose of the antihistamine, if needed, for breakthrough symptoms.  - Consider nasal saline rinses 1-2 times daily to remove  allergens from the nasal cavities as well as help with mucous clearance (this is especially helpful to do before the nasal sprays are given) - Consider allergy shots as a means of long-term control. - Allergy shots "re-train" and "reset" the immune system to ignore environmental allergens and decrease the resulting immune response to those allergens (sneezing, itchy watery eyes, runny nose, nasal congestion, etc).    - Allergy shots improve symptoms in 75-85% of patients.  - We can discuss more at the next appointment if the medications are not working for you.  3. Contact dermatitis - I am not sure what is causing the rash, but lets try treating it with a more aggressive steroid. - Start triamcinolone 0.1% ointment applied twice daily as needed. - We could do test testing in the future if this continues to be an issue.  4. No follow-ups on file.    Please inform [phone] of any Emergency Department visits, hospitalizations, or changes in symptoms. Call us before going to the ED for breathing or allergy symptoms since we might be able to fit you in for a sick visit. Feel free to contact us anytime with any questions, problems, or concerns.  It was a pleasure to meet you today!  Websites that have reliable patient information: 1. American Academy of Asthma, Allergy, and Immunology: www.aaaai.org 2. Food Allergy Research and Education (FARE): foodallergy.org 3. Mothers of Asthmatics: http://www.asthmacommunitynetwork.org 4. American College of Allergy, Asthma, and Immunology: www.acaai.org   COVID-19 Vaccine Information can be found at: [place] For questions related to vaccine distribution or appointments, please email [name] or  call [phone].   We realize that you might be concerned about having an allergic reaction to the COVID19 vaccines. To help with that concern, [name]     "Like" [place] on Facebook and Instagram for our latest updates!      A healthy democracy works best when [employer] participate! Make sure you are registered to vote! If you have moved or changed any of your contact information, you will need to get this updated before voting!  In some cases, you MAY be able to register to vote online: [name]    1. Control-Buffer 50% Glycerol Negative   2. Control-Histamine 1 mg/ml 2+   3. Albumin saline Negative   4. Bahia 3+   5. [place] 3+   6. Johnson 3+   7. [place] Blue 3+   8. Meadow Fescue 3+   9. Perennial Rye 3+   10. Sweet Vernal 3+   11. Timothy 3+   12. Cocklebur 2+   13. Burweed Marshelder 2+   14. Ragweed, short 2+   15. Ragweed, Giant 2+   16. Plantain,  English 2+   17. Lamb's Quarters 2+   18. Sheep Sorrell 2+   19. Rough Pigweed 2+   20. Marsh Elder, Rough Negative   21. Mugwort, Common 3+   22. Ash mix 3+   23. Birch mix 4+   24. Beech American 3+   25. Box, Elder 3+   26. Cedar, red 2+   27. Cottonwood, Eastern 2+   28. Elm mix 2+   29. Hickory 3+   30. Maple mix 3+   31. Oak, [place] mix 2+   32. Pecan Pollen 3+   33. Pine mix 2+   34. Sycamore Eastern Negative   35. Walnut, Black Pollen Negative   36. Alternaria alternata Negative   37. Cladosporium Herbarum 2+   38. Aspergillus mix 2+   39. Penicillium mix 2+   40. Bipolaris sorokiniana (Helminthosporium) 2+   41. Drechslera spicifera (Curvularia) 2+   42. Mucor plumbeus Negative   43. Fusarium moniliforme 2+   44. Aureobasidium pullulans (pullulara) Negative   45. Rhizopus oryzae Negative   46. Botrytis cinera 2+   47. Epicoccum nigrum 2+   48. Phoma betae 2+   49. Candida Albicans Negative   50. Trichophyton mentagrophytes Negative   51. Mite, D Farinae  5,000 AU/ml 3+   52. Mite, D Pteronyssinus  5,000 AU/ml 3+   53. Cat Hair 10,000 BAU/ml 2+   54.  Dog  Epithelia 3+   55. Mixed Feathers 2+   56. Horse Epithelia 2+   57. Cockroach, German Negative   58. Mouse 2+   59. Tobacco Leaf Negative     Reducing Pollen Exposure  The American Academy of Allergy, Asthma and Immunology suggests the following steps to reduce your exposure to pollen during allergy seasons.    1. Do not hang sheets or clothing out to dry; pollen may collect on these items. 2. Do not mow lawns or spend time around freshly cut grass; mowing stirs up pollen. 3. Keep windows closed at night.  Keep car windows closed while driving. 4. Minimize morning activities outdoors, a time when pollen counts are usually at their highest. 5. Stay indoors as much as possible when pollen counts or humidity is high and on windy days when pollen tends to remain in the air longer. 6. Use  air conditioning when possible.  Many air conditioners have filters that trap the pollen spores. 7. Use a HEPA room air filter to remove pollen form the indoor air you breathe.  Control of Mold Allergen   Mold and fungi can grow on a variety of surfaces provided certain temperature and moisture conditions exist.  Outdoor molds grow on plants, decaying vegetation and soil.  The major outdoor mold, Alternaria and Cladosporium, are found in very high numbers during hot and dry conditions.  Generally, a late Summer - Fall peak is seen for common outdoor fungal spores.  Rain will temporarily lower outdoor mold spore count, but counts rise rapidly when the rainy period ends.  The most important indoor molds are Aspergillus and Penicillium.  Dark, humid and poorly ventilated basements are ideal sites for mold growth.  The next most common sites of mold growth are the bathroom and the kitchen.  Outdoor (Seasonal) Mold Control  Positive outdoor molds via skin testing: Cladosporium, Bipolaris (Helminthsporium), Drechslera (Curvalaria) and Epicoccum  1. Use air conditioning and keep windows closed 2. Avoid exposure to  decaying vegetation. 3. Avoid leaf raking. 4. Avoid grain handling. 5. Consider wearing a face mask if working in moldy areas.    Indoor (Perennial) Mold Control   Positive indoor molds via skin testing: Aspergillus, Penicillium, Fusarium, Botrytis and Phoma  1. Maintain humidity below 50%. 2. Clean washable surfaces with 5% bleach solution. 3. Remove sources e.g. contaminated carpets.     Control of Dust Mite Allergen    Dust mites play a major role in allergic asthma and rhinitis.  They occur in environments with high humidity wherever human skin is found.  Dust mites absorb humidity from the atmosphere (ie, they do not drink) and feed on organic matter (including shed human and animal skin).  Dust mites are a microscopic type of insect that you cannot see with the naked eye.  High levels of dust mites have been detected from mattresses, pillows, carpets, upholstered furniture, bed covers, clothes, soft toys and any woven material.  The principal allergen of the dust mite is found in its feces.  A gram of dust may contain 1,000 mites and 250,000 fecal particles.  Mite antigen is easily measured in the air during house cleaning activities.  Dust mites do not bite and do not cause harm to humans, other than by triggering allergies/asthma.    Ways to decrease your exposure to dust mites in your home:  1. Encase mattresses, box springs and pillows with a mite-impermeable barrier or cover   2. Wash sheets, blankets and drapes weekly in hot water (130 F) with detergent and dry them in a dryer on the hot setting.  3. Have the room cleaned frequently with a vacuum cleaner and a damp dust-mop.  For carpeting or rugs, vacuuming with a vacuum cleaner equipped with a high-efficiency particulate air (HEPA) filter.  The dust mite allergic individual should not be in a room which is being cleaned and should wait 1 hour after cleaning before going into the room. 4. Do not sleep on upholstered furniture  (eg, couches).   5. If possible removing carpeting, upholstered furniture and drapery from the home is ideal.  Horizontal blinds should be eliminated in the rooms where the person spends the most time (bedroom, study, television room).  Washable vinyl, roller-type shades are optimal. 6. Remove all non-washable stuffed toys from the bedroom.  Wash stuffed toys weekly like sheets and blankets above.   7. Reduce indoor  humidity to less than 50%.  Inexpensive humidity monitors can be purchased at most hardware stores.  Do not use a humidifier as can make the problem worse and are not recommended.  Control of Dog or Cat Allergen  Avoidance is the best way to manage a dog or cat allergy. If you have a dog or cat and are allergic to dog or cats, consider removing the dog or cat from the home. If you have a dog or cat but don't want to find it a new home, or if your family wants a pet even though someone in the household is allergic, here are some strategies that may help keep symptoms at bay:  1. Keep the pet out of your bedroom and restrict it to only a few rooms. Be advised that keeping the dog or cat in only one room will not limit the allergens to that room. 2. Don't pet, hug or kiss the dog or cat; if you do, wash your hands with soap and water. 3. High-efficiency particulate air (HEPA) cleaners run continuously in a bedroom or living room can reduce allergen levels over time. 4. Regular use of a high-efficiency vacuum cleaner or a central vacuum can reduce allergen levels. 5. Giving your dog or cat a bath at least once a week can reduce airborne allergen.  Allergy Shots   Allergies are the result of a chain reaction that starts in the immune system. Your immune system controls how your body defends itself. For instance, if you have an allergy to pollen, your immune system identifies pollen as an invader or allergen. Your immune system overreacts by producing antibodies called Immunoglobulin E (IgE).  These antibodies travel to cells that release chemicals, causing an allergic reaction.  The concept behind allergy immunotherapy, whether it is received in the form of shots or tablets, is that the immune system can be desensitized to specific allergens that trigger allergy symptoms. Although it requires time and patience, the payback can be long-term relief.  How Do Allergy Shots Work?  Allergy shots work much like a vaccine. Your body responds to injected amounts of a particular allergen given in increasing doses, eventually developing a resistance and tolerance to it. Allergy shots can lead to decreased, minimal or no allergy symptoms.  There generally are two phases: build-up and maintenance. Build-up often ranges from three to six months and involves receiving injections with increasing amounts of the allergens. The shots are typically given once or twice a week, though more rapid build-up schedules are sometimes used.  The maintenance phase begins when the most effective dose is reached. This dose is different for each person, depending on how allergic you are and your response to the build-up injections. Once the maintenance dose is reached, there are longer periods between injections, typically two to four weeks.  Occasionally doctors give cortisone-type shots that can temporarily reduce allergy symptoms. These types of shots are different and should not be confused with allergy immunotherapy shots.  Who Can Be Treated with Allergy Shots?  Allergy shots may be a good treatment approach for people with allergic rhinitis (hay fever), allergic asthma, conjunctivitis (eye allergy) or stinging insect allergy.   Before deciding to begin allergy shots, you should consider:  . The length of allergy season and the severity of your symptoms . Whether medications and/or changes to your environment can control your symptoms . Your desire to avoid long-term medication use . Time: allergy  immunotherapy requires a major time commitment . Cost: may  vary depending on your insurance coverage  Allergy shots for children age 69 and older are effective and often well tolerated. They might prevent the onset of new allergen sensitivities or the progression to asthma.  Allergy shots are not started on patients who are pregnant but can be continued on patients who become pregnant while receiving them. In some patients with other medical conditions or who take certain common medications, allergy shots may be of risk. It is important to mention other medications you talk to your allergist.   When Will I Feel Better?  Some may experience decreased allergy symptoms during the build-up phase. For others, it may take as long as 12 months on the maintenance dose. If there is no improvement after a year of maintenance, your allergist will discuss other treatment options with you.  If you aren't responding to allergy shots, it may be because there is not enough dose of the allergen in your vaccine or there are missing allergens that were not identified during your allergy testing. Other reasons could be that there are high levels of the allergen in your environment or major exposure to non-allergic triggers like tobacco smoke.  What Is the Length of Treatment?  Once the maintenance dose is reached, allergy shots are generally continued for three to five years. The decision to stop should be discussed with your allergist at that time. Some people may experience a permanent reduction of allergy symptoms. Others may relapse and a longer course of allergy shots can be considered.  What Are the Possible Reactions?  The two types of adverse reactions that can occur with allergy shots are local and systemic. Common local reactions include very mild redness and swelling at the injection site, which can happen immediately or several hours after. A systemic reaction, which is less common, affects the entire  body or a particular body system. They are usually mild and typically respond quickly to medications. Signs include increased allergy symptoms such as sneezing, a stuffy nose or hives.  Rarely, a serious systemic reaction called anaphylaxis can develop. Symptoms include swelling in the throat, wheezing, a feeling of tightness in the chest, nausea or dizziness. Most serious systemic reactions develop within 30 minutes of allergy shots. This is why it is strongly recommended you wait in your doctor's office for 30 minutes after your injections. Your allergist is trained to watch for reactions, and his or her staff is trained and equipped with the proper medications to identify and treat them.  Who Should Administer Allergy Shots?  The preferred location for receiving shots is your prescribing allergist's office. Injections can sometimes be given at another facility where the physician and staff are trained to recognize and treat reactions, and have received instructions by your prescribing allergist.

## 2021-04-13 ENCOUNTER — Encounter: Payer: Self-pay | Admitting: Allergy & Immunology

## 2021-06-07 ENCOUNTER — Ambulatory Visit (INDEPENDENT_AMBULATORY_CARE_PROVIDER_SITE_OTHER): Payer: Medicaid Other | Admitting: Family Medicine

## 2021-06-07 ENCOUNTER — Other Ambulatory Visit: Payer: Self-pay

## 2021-06-07 ENCOUNTER — Encounter: Payer: Self-pay | Admitting: Family Medicine

## 2021-06-07 VITALS — BP 122/82 | HR 81 | Temp 98.0°F | Resp 16 | Ht 64.0 in | Wt 200.6 lb

## 2021-06-07 DIAGNOSIS — H1010 Acute atopic conjunctivitis, unspecified eye: Secondary | ICD-10-CM | POA: Insufficient documentation

## 2021-06-07 DIAGNOSIS — H1013 Acute atopic conjunctivitis, bilateral: Secondary | ICD-10-CM

## 2021-06-07 DIAGNOSIS — J3089 Other allergic rhinitis: Secondary | ICD-10-CM | POA: Diagnosis not present

## 2021-06-07 DIAGNOSIS — J302 Other seasonal allergic rhinitis: Secondary | ICD-10-CM | POA: Diagnosis not present

## 2021-06-07 DIAGNOSIS — K219 Gastro-esophageal reflux disease without esophagitis: Secondary | ICD-10-CM | POA: Diagnosis not present

## 2021-06-07 DIAGNOSIS — L259 Unspecified contact dermatitis, unspecified cause: Secondary | ICD-10-CM | POA: Diagnosis not present

## 2021-06-07 DIAGNOSIS — J449 Chronic obstructive pulmonary disease, unspecified: Secondary | ICD-10-CM

## 2021-06-07 MED ORDER — OLOPATADINE HCL 0.2 % OP SOLN: 1.0000 [drp] | Freq: Every day | OPHTHALMIC | 3 refills | Status: DC | PRN

## 2021-06-07 NOTE — Progress Notes (Signed)
[address] [place] [address] Dept: [phone]  FOLLOW UP NOTE  Patient ID: Savannah Pena, female    DOB: [birth year]  Age: 60 y.o. MRN: [id number] Date of Office Visit: 06/07/2021  Assessment  Chief Complaint: Asthma  HPI Savannah Pena is a 60 year old female who presents to the clinic for follow-up visit.  She was last seen in this clinic on 05/09/2021 for evaluation of asthma/COPD, allergic rhinitis, and dermatitis.  At today's visit, she reports her asthma has been somewhat better controlled since her last visit to this clinic.  She reports symptoms including a little shortness of breath, no wheeze, and occasional dry cough.  She continues montelukast 10 mg once a day, Breztri-2 puffs twice a day with no spacer, and albuterol about once a day.  Allergic rhinitis is reported as moderately well controlled with symptoms including occasional nasal congestion, sneeze, and postnasal drainage.  She reports these symptoms are improving with Xyzal 5 mg twice a day and Flonase daily.  She is not currently using a nasal saline rinse.  Allergic conjunctivitis is reported as poorly controlled with symptoms including red and itchy eyes for which she is currently using Visine red out without relief of symptoms.  Reflux is reported as moderately well controlled with omeprazole 40 mg once a day.  She reports that, about 5 years ago, she developed 7 raised red areas on her left arm that have persisted over the last 5 years without changing.  She denies itch.  She has used triamcinolone with no relief of symptoms.  Her current medications are listed in the chart.   Drug Allergies:  Allergies  Allergen Reactions   Fruit Extracts Nausea And Vomiting    Bananas    Physical Exam: BP 122/82 (BP Location: Right Arm, Patient Position: Sitting, Cuff Size: Normal)   Pulse 81   Temp 98 F (36.7 C) (Temporal)   Resp 16   Ht 5\' 4"  (1.626 m)   Wt 200 lb 9.6 oz (91 kg)   LMP 12/06/2011  (Approximate)   SpO2 97%   BMI 34.43 kg/m    Physical Exam Vitals reviewed.  Constitutional:      Appearance: Normal appearance.  HENT:     Head: Normocephalic and atraumatic.     Right Ear: Tympanic membrane normal.     Left Ear: Tympanic membrane normal.     Nose:     Comments: Bilateral nares slightly erythematous with clear nasal drainage noted. Pharynx edematous with cobblestone appearance. Ears normal. Eyes normal. Eyes:     Conjunctiva/sclera: Conjunctivae normal.  Cardiovascular:     Rate and Rhythm: Normal rate and regular rhythm.     Heart sounds: Normal heart sounds. No murmur heard. Pulmonary:     Effort: Pulmonary effort is normal.     Breath sounds: Normal breath sounds.     Comments: Lungs clear to auscultation Musculoskeletal:        General: Normal range of motion.     Cervical back: Normal range of motion and neck supple.  Skin:    General: Skin is warm and dry.  Neurological:     Mental Status: She is alert and oriented to person, place, and time.  Psychiatric:        Mood and Affect: Mood normal.        Behavior: Behavior normal.        Thought Content: Thought content normal.        Judgment: Judgment normal.    Diagnostics:  FVC 2.56, FEV1 1.86.  Predicted FVC 3.20, predicted FEV1 2.52.  Spirometry indicates normal ventilatory function.  Assessment and Plan: 1. Asthma-COPD overlap syndrome (HCC)   2. Seasonal and perennial allergic rhinitis   3. Seasonal allergic conjunctivitis   4. Contact dermatitis, unspecified contact dermatitis type, unspecified trigger   5. Gastroesophageal reflux disease, unspecified whether esophagitis present     Meds ordered this encounter  Medications   Olopatadine HCl 0.2 % SOLN    Sig: Apply 1 drop to eye daily as needed.    Dispense:  2.5 mL    Refill:  3     Patient Instructions  Asthma Continue montelukast 10 mg once a day to prevent cough or wheeze Continue Breztri 2 puffs twice a day with a spacer to  prevent cough or wheeze Continue albuterol 2 puffs every 4 hours as needed for cough or wheeze OR Instead use albuterol 0.083% solution via nebulizer one unit vial every 4 hours as needed for cough or wheeze We will submit your information regarding beginning a biologic medication to control your asthma.  You will next hear from our biologic coordinator, Tammy, about next steps to take  Allergic rhinitis Continue allergen avoidance measures directed toward pollens, mold, dust mite, and pets as listed below Continue Xyzal 5 mg once a day.  You may take an additional Xyzal 5 mg once a day as needed for breakthrough symptoms Continue Flonase 2 sprays in each nostril once a day as needed for a stuffy nose. In the right nostril, point the applicator out toward the right ear. In the left nostril, point the applicator out toward the left ear Consider saline nasal rinses as needed for nasal symptoms. Use this before any medicated nasal sprays for best result If your symptoms are not well controlled by the plan as listed above, consider allergen immunotherapy when your breathing is better  Allergic conjunctivitis Some over the counter eye drops include Pataday one drop in each eye once a day as needed for red, itchy eyes OR Zaditor one drop in each eye twice a day as needed for red itchy eyes.  Dermatitis Recommend referral to dermatology if needed  Reflux Continue dietary and lifestyle modifications as listed below Continue omeprazole as previously prescribed  Call the clinic if this treatment plan is not working well for you.  Follow up in 3 months or sooner if needed.   Return in about 3 months (around 09/07/2021), or if symptoms worsen or fail to improve.    Thank you for the opportunity to care for this patient.  Please do not hesitate to contact me with questions.  Thermon Leyland, FNP [facility]

## 2021-06-07 NOTE — Patient Instructions (Signed)
Asthma Continue montelukast 10 mg once a day to prevent cough or wheeze Continue Breztri 2 puffs twice a day with a spacer to prevent cough or wheeze Continue albuterol 2 puffs every 4 hours as needed for cough or wheeze OR Instead use albuterol 0.083% solution via nebulizer one unit vial every 4 hours as needed for cough or wheeze We will submit your information regarding beginning a biologic medication to control your asthma.  You will next hear from our biologic coordinator, Tammy, about next steps to take  Allergic rhinitis Continue allergen avoidance measures directed toward pollens, mold, dust mite, and pets as listed below Continue Xyzal 5 mg once a day.  You may take an additional Xyzal 5 mg once a day as needed for breakthrough symptoms Continue Flonase 2 sprays in each nostril once a day as needed for a stuffy nose. In the right nostril, point the applicator out toward the right ear. In the left nostril, point the applicator out toward the left ear Consider saline nasal rinses as needed for nasal symptoms. Use this before any medicated nasal sprays for best result If your symptoms are not well controlled by the plan as listed above, consider allergen immunotherapy when your breathing is better  Allergic conjunctivitis Some over the counter eye drops include Pataday one drop in each eye once a day as needed for red, itchy eyes OR Zaditor one drop in each eye twice a day as needed for red itchy eyes.  Dermatitis Recommend referral to dermatology if needed  Reflux Continue dietary and lifestyle modifications as listed below Continue omeprazole as previously prescribed  Call the clinic if this treatment plan is not working well for you.  Follow up in 3 months or sooner if needed.  Reducing Pollen Exposure The American Academy of All[employer], [employer] and Immun[employer] suggests the following steps to reduce your exposure to pollen during allergy seasons. Do not hang sheets or clothing out to  dry; pollen may collect on these items. Do not mow lawns or spend time around freshly cut grass; mowing stirs up pollen. Keep windows closed at night.  Keep car windows closed while driving. Minimize morning activities outdoors, a time when pollen counts are usually at their highest. Stay indoors as much as possible when pollen counts or humidity is high and on windy days when pollen tends to remain in the air longer. Use air conditioning when possible.  Many air conditioners have filters that trap the pollen spores. Use a HEPA room air filter to remove pollen form the indoor air you breathe.  Control of Dog or Cat Allergen Avoidance is the best way to manage a dog or cat allergy. If you have a dog or cat and are allergic to dog or cats, consider removing the dog or cat from the home. If you have a dog or cat but don't want to find it a new home, or if your family wants a pet even though someone in the household is allergic, here are some strategies that may help keep symptoms at bay:  Keep the pet out of your bedroom and restrict it to only a few rooms. Be advised that keeping the dog or cat in only one room will not limit the allergens to that room. Don't pet, hug or kiss the dog or cat; if you do, wash your hands with soap and water. High-efficiency particulate air (HEPA) cleaners run continuously in a bedroom or living room can reduce allergen levels over time. Regular use of  a high-efficiency vacuum cleaner or a central vacuum can reduce allergen levels. Giving your dog or cat a bath at least once a week can reduce airborne allergen.  Control of Mold Allergen Mold and fungi can grow on a variety of surfaces provided certain temperature and moisture conditions exist.  Outdoor molds grow on plants, decaying vegetation and soil.  The major outdoor mold, Alternaria and Cladosporium, are found in very high numbers during hot and dry conditions.  Generally, a late Summer - Fall peak is seen for  common outdoor fungal spores.  Rain will temporarily lower outdoor mold spore count, but counts rise rapidly when the rainy period ends.  The most important indoor molds are Aspergillus and Penicillium.  Dark, humid and poorly ventilated basements are ideal sites for mold growth.  The next most common sites of mold growth are the bathroom and the kitchen.  Outdoor [employer] Use air conditioning and keep windows closed Avoid exposure to decaying vegetation. Avoid leaf raking. Avoid grain handling. Consider wearing a face mask if working in moldy areas.  Indoor Mold Control Maintain humidity below 50%. Clean washable surfaces with 5% bleach solution. Remove sources e.g. Contaminated carpets.  Control of Dust Mite Allergen Dust mites play a major role in allergic asthma and rhinitis. They occur in environments with high humidity wherever human skin is found. Dust mites absorb humidity from the atmosphere (ie, they do not drink) and feed on organic matter (including shed human and animal skin). Dust mites are a microscopic type of insect that you cannot see with the naked eye. High levels of dust mites have been detected from mattresses, pillows, carpets, upholstered furniture, bed covers, clothes, soft toys and any woven material. The principal allergen of the dust mite is found in its feces. A gram of dust may contain 1,000 mites and 250,000 fecal particles. Mite antigen is easily measured in the air during house cleaning activities. Dust mites do not bite and do not cause harm to humans, other than by triggering allergies/asthma.  Ways to decrease your exposure to dust mites in your home:  1. Encase mattresses, box springs and pillows with a mite-impermeable barrier or cover  2. Wash sheets, blankets and drapes weekly in hot water (130 F) with detergent and dry them in a dryer on the hot setting.  3. Have the room cleaned frequently with a vacuum cleaner and a damp dust-mop. For carpeting  or rugs, vacuuming with a vacuum cleaner equipped with a high-efficiency particulate air (HEPA) filter. The dust mite allergic individual should not be in a room which is being cleaned and should wait 1 hour after cleaning before going into the room.  4. Do not sleep on upholstered furniture (eg, couches).  5. If possible removing carpeting, upholstered furniture and drapery from the home is ideal. Horizontal blinds should be eliminated in the rooms where the person spends the most time (bedroom, study, television room). Washable vinyl, roller-type shades are optimal.  6. Remove all non-washable stuffed toys from the bedroom. Wash stuffed toys weekly like sheets and blankets above.  7. Reduce indoor humidity to less than 50%. Inexpensive humidity monitors can be purchased at most hardware stores. Do not use a humidifier as can make the problem worse and are not recommended.

## 2021-06-10 ENCOUNTER — Telehealth: Payer: Self-pay | Admitting: *Deleted

## 2021-06-10 NOTE — Telephone Encounter (Signed)
-----   Message from Hetty Blend, FNP sent at 06/07/2021  4:43 PM EDT ----- Hi Kaitlyn Skowron Can you please submit this patient for either Fasenra or Dupixent for asthma control. She has absolute eos of greater than 1000 on 01/11/2021 and Medicaid. Thank you

## 2021-06-10 NOTE — Telephone Encounter (Signed)
L/m for patient to contact me to advise approval and submit for Dupixent for her asthma to [facility]

## 2021-06-13 DIAGNOSIS — E1169 Type 2 diabetes mellitus with other specified complication: Secondary | ICD-10-CM | POA: Diagnosis not present

## 2021-06-13 DIAGNOSIS — E782 Mixed hyperlipidemia: Secondary | ICD-10-CM | POA: Diagnosis not present

## 2021-06-13 NOTE — Telephone Encounter (Signed)
L/m for patient to contact me  

## 2021-06-25 NOTE — Telephone Encounter (Signed)
L/m for patient to contact me  

## 2021-07-01 NOTE — Telephone Encounter (Signed)
Thank you :)

## 2021-07-01 NOTE — Telephone Encounter (Signed)
Patient never called back.

## 2021-08-06 DIAGNOSIS — Z0120 Encounter for dental examination and cleaning without abnormal findings: Secondary | ICD-10-CM | POA: Diagnosis not present

## 2021-08-26 ENCOUNTER — Ambulatory Visit (INDEPENDENT_AMBULATORY_CARE_PROVIDER_SITE_OTHER): Payer: Medicaid Other | Admitting: Allergy

## 2021-08-26 ENCOUNTER — Other Ambulatory Visit: Payer: Self-pay

## 2021-08-26 ENCOUNTER — Telehealth: Payer: Self-pay | Admitting: *Deleted

## 2021-08-26 ENCOUNTER — Encounter: Payer: Self-pay | Admitting: Allergy

## 2021-08-26 VITALS — BP 120/82 | HR 106 | Temp 97.8°F | Resp 18 | Ht 64.0 in | Wt 206.4 lb

## 2021-08-26 DIAGNOSIS — J3089 Other allergic rhinitis: Secondary | ICD-10-CM | POA: Diagnosis not present

## 2021-08-26 DIAGNOSIS — K219 Gastro-esophageal reflux disease without esophagitis: Secondary | ICD-10-CM | POA: Diagnosis not present

## 2021-08-26 DIAGNOSIS — J302 Other seasonal allergic rhinitis: Secondary | ICD-10-CM | POA: Diagnosis not present

## 2021-08-26 DIAGNOSIS — L309 Dermatitis, unspecified: Secondary | ICD-10-CM

## 2021-08-26 DIAGNOSIS — J4551 Severe persistent asthma with (acute) exacerbation: Secondary | ICD-10-CM

## 2021-08-26 DIAGNOSIS — J449 Chronic obstructive pulmonary disease, unspecified: Secondary | ICD-10-CM

## 2021-08-26 MED ORDER — BREZTRI AEROSPHERE 160-9-4.8 MCG/ACT IN AERO: 2.0000 | INHALATION_SPRAY | Freq: Two times a day (BID) | RESPIRATORY_TRACT | 5 refills | Status: DC

## 2021-08-26 MED ORDER — MONTELUKAST SODIUM 10 MG PO TABS: 10.0000 mg | ORAL_TABLET | Freq: Every day | ORAL | 5 refills | Status: DC

## 2021-08-26 MED ORDER — LEVOCETIRIZINE DIHYDROCHLORIDE 5 MG PO TABS: 5.0000 mg | ORAL_TABLET | Freq: Every evening | ORAL | 5 refills | Status: DC

## 2021-08-26 NOTE — Telephone Encounter (Signed)
Patient in clinic today seeing Dr Delorse Lek. Office reached out to me to speak to patent regarding Dupixent. I gave patient instrux on delivery. Storage, dosing and bringing loading dose in for initial injection in clinic

## 2021-08-26 NOTE — Progress Notes (Deleted)
New Patient Note  RE: Savannah Pena MRN: [id number] DOB: [birth year] Date of Office Visit: 08/26/2021  Referring provider: Benita Stabile, MD Primary care provider: Benita Stabile, MD  Chief Complaint: ***  History of present illness: Savannah Pena is a 60 y.o. female presenting today for {Blank single:19197::"evaluation of","consultation for"} ***   Review of systems: ROS  {Blank single:19197::" ","All other systems negative unless noted above in HPI"}  Past medical history: Past Medical History:  Diagnosis Date   Anxiety    Arthritis    Asthma    Attention deficit disorder (ADD)    COPD (chronic obstructive pulmonary disease) (HCC)    Diabetes mellitus without complication (HCC)    Dyspnea    with exertion   GERD (gastroesophageal reflux disease)    History of kidney stones    Hypertension    Kidney stone    Major depression    Osteoporosis    Seizures (HCC)    last seizure 23 years ago   Sleep apnea    has C-PAP, but does not currently use   Takotsubo cardiomyopathy    a. EF 35-40% by echo in 03/2018 with cath showing mild, nonobstructive CAD.     Past surgical history: Past Surgical History:  Procedure Laterality Date   CESAREAN SECTION     CYSTOSCOPY WITH RETROGRADE PYELOGRAM, URETEROSCOPY AND STENT PLACEMENT Right 12/05/2016   Procedure: CYSTOSCOPY WITH RETROGRADE PYELOGRAM, URETEROSCOPY AND STENT PLACEMENT;  Surgeon: Sebastian Ache, MD;  Location: [facility];  Service: Urology;  Laterality: Right;   CYSTOSCOPY WITH STENT PLACEMENT Right 11/06/2016   Procedure: CYSTOSCOPY WITH STENT PLACEMENT;  Surgeon: Bjorn Pippin, MD;  Location: [facility];  Service: Urology;  Laterality: Right;   FLEXOR TENDON REPAIR Right    foot   KIDNEY STONE SURGERY     LEFT HEART CATH AND CORONARY ANGIOGRAPHY N/A 03/17/2018   Procedure: LEFT HEART CATH AND CORONARY ANGIOGRAPHY;  Surgeon: Kathleene Hazel, MD;  Location: [facility];  Service: Cardiovascular;  Laterality: N/A;     Family history:  Family History  Problem Relation Age of Onset   Prostate cancer Father    Diabetes Father     Social history: Social History   Socioeconomic History   Marital status: Single    Spouse name: Not on file   Number of children: Not on file   Years of education: Not on file   Highest education level: Not on file  Occupational History   Not on file  Tobacco Use   Smoking status: Former    Types: Cigarettes    Quit date: 11/12/2016    Years since quitting: 4.7   Smokeless tobacco: Former   Tobacco comments:    04-08-2017 per pt stopped in Jan 2018  Vaping Use   Vaping Use: Never used  Substance and Sexual Activity   Alcohol use: Yes    Comment: occassional, 04-08-2017 per pt occas.   Drug use: No    Comment: 04-08-2017 per pt no    Sexual activity: Yes    Birth control/protection: Post-menopausal  Other Topics Concern   Not on file  Social History Narrative   Not on file   Social Determinants of Health   Financial Resource Strain: Not on file  Food Insecurity: Not on file  Transportation Needs: Not on file  Physical Activity: Not on file  Stress: Not on file  Social Connections: Not on file  Intimate Partner Violence: Not on file    Medication  List: Current Outpatient Medications  Medication Sig Dispense Refill   acetaminophen (TYLENOL) 500 MG tablet Take 500 mg by mouth every 6 (six) hours as needed.     albuterol (VENTOLIN HFA) 108 (90 Base) MCG/ACT inhaler Inhale 1-2 puffs into the lungs every 6 (six) hours as needed for wheezing or shortness of breath. 18 g 0   ALPRAZolam (XANAX) 0.5 MG tablet Take 0.5 mg by mouth as needed for anxiety.     aspirin EC 81 MG EC tablet Take 1 tablet (81 mg total) by mouth daily.     atorvastatin (LIPITOR) 40 MG tablet Take 1 tablet (40 mg total) by mouth daily at 6 PM. 90 tablet 3   azithromycin (ZITHROMAX) 250 MG tablet Take 1 tablet (250 mg total) by mouth daily. Take first 2 tablets together, then 1 every day  until finished. 6 tablet 0   buPROPion (WELLBUTRIN XL) 150 MG 24 hr tablet Take 150 mg by mouth daily.  0   cetirizine (ZYRTEC) 10 MG tablet Take 10 mg by mouth daily.     D3-50 50000 units capsule Take 50,000 Units by mouth once a week.  1   desvenlafaxine (PRISTIQ) 50 MG 24 hr tablet Take 50 mg by mouth daily.     diclofenac (VOLTAREN) 50 MG EC tablet Take 50 mg by mouth 2 (two) times daily.  5   diphenhydrAMINE HCl (BENADRYL PO) Take by mouth.     DULoxetine (CYMBALTA) 60 MG capsule Take 60 mg by mouth daily.     fluticasone (FLONASE) 50 MCG/ACT nasal spray Place 1 spray into both nostrils daily. 1 g 5   Fluticasone Propionate HFA (FLOVENT HFA IN) Inhale into the lungs.     gabapentin (NEURONTIN) 300 MG capsule Take 300 mg by mouth 2 (two) times daily.  5   HYDROcodone-acetaminophen (NORCO/VICODIN) 5-325 MG tablet Take 1 tablet by mouth every 6 (six) hours as needed. 20 tablet 0   ipratropium-albuterol (DUONEB) 0.5-2.5 (3) MG/3ML SOLN Take by nebulization.     levocetirizine (XYZAL) 5 MG tablet Take 1 tablet (5 mg total) by mouth every evening. 30 tablet 5   lisinopril (PRINIVIL,ZESTRIL) 10 MG tablet Take 1 tablet (10 mg total) by mouth daily. 90 tablet 3   metFORMIN (GLUCOPHAGE) 1000 MG tablet Take 1 tablet by mouth 2 (two) times daily.     methocarbamol (ROBAXIN) 750 MG tablet      metoprolol succinate (TOPROL XL) 25 MG 24 hr tablet Take 1 tablet (25 mg total) by mouth daily. 90 tablet 3   montelukast (SINGULAIR) 10 MG tablet Take 1 tablet (10 mg total) by mouth daily. 30 tablet 5   Multiple Vitamins-Minerals (PRESERVISION AREDS 2 PO) Take by mouth 2 (two) times daily.     Nebulizers (INNOSPIRE ESSENCE NEBULIZER) MISC      Olopatadine HCl 0.2 % SOLN Apply 1 drop to eye daily as needed. 2.5 mL 3   Omega 3 1000 MG CAPS Take by mouth 3 (three) times daily.     omeprazole (PRILOSEC) 40 MG capsule Take 40 mg by mouth daily.     oxybutynin (DITROPAN) 5 MG tablet Take 1 tablet (5 mg total) by  mouth every 8 (eight) hours as needed for bladder spasms. And stent discomfort. 90 tablet 1   pantoprazole (PROTONIX) 40 MG tablet Take 40 mg by mouth daily.     phentermine (ADIPEX-P) 37.5 MG tablet      phenytoin (DILANTIN) 200 MG ER capsule Take 1 capsule (200 mg total)  by mouth 2 (two) times daily. 60 capsule 0   predniSONE (DELTASONE) 10 MG tablet Take 2 tablets (20 mg total) by mouth daily. 14 tablet 0   Spacer/Aero-Holding Chambers DEVI 1 each by Does not apply route daily as needed. 1 each 1   Tiotropium Bromide-Olodaterol 2.5-2.5 MCG/ACT AERS Inhale into the lungs.     tiZANidine (ZANAFLEX) 4 MG tablet Take 4 mg by mouth every 6 (six) hours as needed for muscle spasms.     triamcinolone ointment (KENALOG) 0.1 % SMARTSIG:1 Topical Daily PRN     TRULICITY 1.5 MG/0.5ML SOPN Inject into the skin.     Vitamin D, Ergocalciferol, (DRISDOL) 50000 units CAPS capsule Take 50,000 Units by mouth every 7 (seven) days.     VYVANSE 70 MG capsule Take 70 mg by mouth at bedtime.     No current facility-administered medications for this visit.    Known medication allergies: Allergies  Allergen Reactions   Fruit Extracts Nausea And Vomiting    Bananas     Physical examination: Last menstrual period 12/06/2011.  General: Alert, interactive, in no acute distress. HEENT: TMs pearly gray, turbinates {Blank single:19197::"non-edematous","edematous","edematous and pale","markedly edematous","markedly edematous and pale","moderately edematous","mildly edematous","minimally edematous"} {Blank single:19197::"with crusty discharge","with thick discharge","with clear discharge","without discharge"}, post-pharynx {Blank single:19197::"unremarkable","non erythematous","erythematous","markedly erythematous","moderately erythematous","mildly erythematous"}. Neck: Supple without lymphadenopathy. Lungs: {Blank single:19197::"Decreased breath sounds with expiratory wheezing bilaterally","Mildly decreased breath  sounds with expiratory wheezing bilaterally","Decreased breath sounds bilaterally without wheezing, rhonchi or rales","Mildly decreased breath sounds bilaterally without wheezing, rhonchi or rales","Clear to auscultation without wheezing, rhonchi or rales"}. {{Blank single:19197::"increased work of breathing","no increased work of breathing"}. CV: Normal S1, S2 without murmurs. Abdomen: Nondistended, nontender. Skin: {Blank single:19197::"Dry, erythematous, excoriated patches on the ***","Dry, hyperpigmented, thickened patches on the ***","Dry, mildly hyperpigmented, mildly thickened patches on the ***","Scattered erythematous urticarial type lesions primarily located *** , nonvesicular","Warm and dry, without lesions or rashes"}. Extremities:  No clubbing, cyanosis or edema. Neuro:   Grossly intact.  Diagnositics/Labs: Labs: ***  Spirometry: {Blank single:19197::"results normal","FEV1: ***, FVC: ***, ratio consistent with ***"}  Allergy testing: *** Allergy testing results were read and interpreted by provider, documented by clinical staff.   Assessment and plan: There are no Patient Instructions on file for this visit.  No follow-ups on file.  I appreciate the opportunity to take part in Savannah Pena care. Please do not hesitate to contact me with questions.  Sincerely,   Margo Aye, MD Allergy/Immunology [facility]

## 2021-08-26 NOTE — Patient Instructions (Addendum)
Asthma with flare Continue montelukast 10 mg once a day to prevent cough or wheeze Resume Breztri 2 puffs twice a day with a spacer to prevent cough or wheeze.   (Samples provided today) Continue albuterol 2 puffs every 4 hours as needed for cough or wheeze OR Instead use albuterol 0.083% solution via nebulizer one unit vial every 4 hours as needed for cough or wheeze.   Can also use duoneb every 6 hours as needed.   Dupixent protocol, benefits and risk has previously been discussed with you and submission for approval previously made.  Will need you to talk to Tammy to arrange delivery of your medication so you can get started on this therapy.  Dupixent is a asthma and eczema/dermatitis medication that is done every 2 weeks and can be self-administered.  It can help provide asthma control and decrease need for systemic steroids like prednisone  Asthma control goals:  Full participation in all desired activities (may need albuterol before activity) Albuterol use two time or less a week on average (not counting use with activity) Cough interfering with sleep two time or less a month Oral steroids no more than once a year No hospitalizations   Allergic rhinitis Continue allergen avoidance measures directed toward pollens, mold, dust mite, and pets as listed below Continue Xyzal 5 mg once a day.  You may take an additional Xyzal 5 mg once a day as needed for breakthrough symptoms Continue Flonase 2 sprays in each nostril once a day as needed for a stuffy nose. In the right nostril, point the applicator out toward the right ear. In the left nostril, point the applicator out toward the left ear Consider saline nasal rinses as needed for nasal symptoms. Use this before any medicated nasal sprays for best result If your symptoms are not well controlled by the plan as listed above, consider allergen immunotherapy when your breathing is under good control  Allergic conjunctivitis Some over the counter  eye drops include Pataday one drop in each eye once a day as needed for red, itchy eyes OR Zaditor one drop in each eye twice a day as needed for red itchy eyes.  Dermatitis Continue as needed use of triamcinolone for itchy, patchy, dry, bumpy, irritated, scaly, flaky red areas Dupixant as above Recommend referral to dermatology if needed  Reflux Continue dietary and lifestyle modifications as listed below Continue protonix as directed   Follow up in 2-3 months or sooner if needed

## 2021-08-26 NOTE — Progress Notes (Signed)
Follow-up Note  RE: Savannah Pena MRN: [id number] DOB: June 10, 1961 Date of Office Visit: 08/26/2021   History of present illness: Savannah Pena is a 60 y.o. female presenting today for follow-up of asthma, allergic rhinitis with conjunctivitis, dermatitis and reflux.  She was last seen in the office on 06/07/2021 by our nurse practitioner Ambs.   At the last visit it appears Dupixent was discussed with the patient and approval process was started.  However our nurse coordinator for Dupixent states that she was never able to reach Savannah Pena to get the approval confirmed.  Thus she has not started on Dupixent.  Patient does state that she received mailing from her insurance that dupixent was approved.    Today she states she is sick.  She feels she has a chest cold and states she is 'hacking a ton of mucus' up for past couple days.  She states the mucus is thick yellow but there is so much of it.  She has been having headache.  Several days ago did note nasal congestion and lots of sneezing. She reports wheezing and chest hurts when coughing.  Does not feel she has had a fever, chills or sweats.  She denies any sick contacts.  She did do a covid test at home that was negative.  She has been using her nebulizer alternating between albuterol and duoneb every 4 hours.  She has been taking mucinex 1200 mg twice a day with lots of water.  She continues taking singulair daily.  She was given a sample of Breztri at last visit and used it up.  She does feel that [name] was helpful for her symptom control.  However she has been out for a while now.  She would like a another sample or prescription for this. She states she does continue to take daily xyzal and flonase.  She states she does wake up with lots of itchy eyes.  She does use an allergy based eyedrop. She states she gets random rashes often.  She does have triamcinolone that she will use as needed with some relief. Her omeprazole was changed to protonix  that she takes daily now.  Patient states she did use a DuoNeb prior to this appointment, so within the hour.  Review of systems: Review of Systems  Constitutional: Negative.   HENT:  Positive for congestion.   Eyes:        See HPI  Respiratory:  Positive for cough, sputum production, shortness of breath and wheezing. Negative for hemoptysis.   Cardiovascular: Negative.   Gastrointestinal: Negative.   Musculoskeletal: Negative.   Skin:  Positive for itching and rash.  Neurological: Negative.    All other systems negative unless noted above in HPI  Past medical/social/surgical/family history have been reviewed and are unchanged unless specifically indicated below.  No changes  Medication List: Current Outpatient Medications  Medication Sig Dispense Refill   acetaminophen (TYLENOL) 500 MG tablet Take 500 mg by mouth every 6 (six) hours as needed.     albuterol (VENTOLIN HFA) 108 (90 Base) MCG/ACT inhaler Inhale 1-2 puffs into the lungs every 6 (six) hours as needed for wheezing or shortness of breath. 18 g 0   ALPRAZolam (XANAX) 0.5 MG tablet Take 0.5 mg by mouth as needed for anxiety.     aspirin EC 81 MG EC tablet Take 1 tablet (81 mg total) by mouth daily.     atorvastatin (LIPITOR) 40 MG tablet Take 1 tablet (40 mg total) by mouth  daily at 6 PM. 90 tablet 3   buPROPion (WELLBUTRIN XL) 150 MG 24 hr tablet Take 150 mg by mouth daily.  0   D3-50 50000 units capsule Take 50,000 Units by mouth once a week.  1   desvenlafaxine (PRISTIQ) 50 MG 24 hr tablet Take 50 mg by mouth daily.     diphenhydrAMINE HCl (BENADRYL PO) Take by mouth.     fluticasone (FLONASE) 50 MCG/ACT nasal spray Place 1 spray into both nostrils daily. 1 g 5   gabapentin (NEURONTIN) 300 MG capsule Take 300 mg by mouth 2 (two) times daily.  5   ipratropium-albuterol (DUONEB) 0.5-2.5 (3) MG/3ML SOLN Take by nebulization.     levocetirizine (XYZAL) 5 MG tablet Take 1 tablet (5 mg total) by mouth every evening. 30  tablet 5   lisinopril (PRINIVIL,ZESTRIL) 10 MG tablet Take 1 tablet (10 mg total) by mouth daily. 90 tablet 3   metFORMIN (GLUCOPHAGE) 1000 MG tablet Take 1 tablet by mouth 2 (two) times daily.     metoprolol succinate (TOPROL XL) 25 MG 24 hr tablet Take 1 tablet (25 mg total) by mouth daily. 90 tablet 3   montelukast (SINGULAIR) 10 MG tablet Take 1 tablet (10 mg total) by mouth daily. 30 tablet 5   Multiple Vitamins-Minerals (PRESERVISION AREDS 2 PO) Take by mouth 2 (two) times daily.     Nebulizers (INNOSPIRE ESSENCE NEBULIZER) MISC      Olopatadine HCl 0.2 % SOLN Apply 1 drop to eye daily as needed. 2.5 mL 3   Omega 3 1000 MG CAPS Take by mouth 3 (three) times daily.     oxybutynin (DITROPAN) 5 MG tablet Take 1 tablet (5 mg total) by mouth every 8 (eight) hours as needed for bladder spasms. And stent discomfort. 90 tablet 1   pantoprazole (PROTONIX) 40 MG tablet Take 40 mg by mouth daily.     phentermine (ADIPEX-P) 37.5 MG tablet      phenytoin (DILANTIN) 200 MG ER capsule Take 1 capsule (200 mg total) by mouth 2 (two) times daily. 60 capsule 0   triamcinolone ointment (KENALOG) 0.1 % SMARTSIG:1 Topical Daily PRN     TRULICITY 1.5 MG/0.5ML SOPN Inject into the skin.     Vitamin D, Ergocalciferol, (DRISDOL) 50000 units CAPS capsule Take 50,000 Units by mouth every 7 (seven) days.     VYVANSE 70 MG capsule Take 70 mg by mouth at bedtime.     diclofenac (VOLTAREN) 50 MG EC tablet Take 50 mg by mouth 2 (two) times daily. (Patient not taking: Reported on 08/26/2021)  5   methocarbamol (ROBAXIN) 750 MG tablet  (Patient not taking: Reported on 08/26/2021)     Spacer/Aero-Holding Deretha Emory DEVI 1 each by Does not apply route daily as needed. (Patient not taking: Reported on 08/26/2021) 1 each 1   tiZANidine (ZANAFLEX) 4 MG tablet Take 4 mg by mouth every 6 (six) hours as needed for muscle spasms. (Patient not taking: Reported on 08/26/2021)     No current facility-administered medications for this  visit.     Known medication allergies: Allergies  Allergen Reactions   Fruit Extracts Nausea And Vomiting    Bananas     Physical examination: Blood pressure 120/82, pulse (!) 106, temperature 97.8 F (36.6 C), temperature source Temporal, resp. rate 18, height 5\' 4"  (1.626 m), weight 206 lb 6.4 oz (93.6 kg), last menstrual period 12/06/2011, SpO2 97 %.  General: Alert, interactive, in no acute distress. HEENT: PERRLA, TMs pearly gray, turbinates  mildly edematous without discharge, post-pharynx non erythematous. Neck: Supple without lymphadenopathy. Lungs: Mildly decreased breath sounds with expiratory wheezing bilaterally. {no increased work of breathing. CV: Normal S1, S2 without murmurs. Abdomen: Nondistended, nontender. Skin: Warm and dry, without lesions or rashes. Extremities:  No clubbing, cyanosis or edema. Neuro:   Grossly intact.  Diagnositics/Labs:  Spirometry: FEV1: 1.24L 49%, FVC: 3.19L 61%, ratio consistent with restrictive pattern  Assessment and plan:   Asthma with flare Continue montelukast 10 mg once a day to prevent cough or wheeze Resume Breztri 2 puffs twice a day with a spacer to prevent cough or wheeze.   (Samples provided today) Continue albuterol 2 puffs every 4 hours as needed for cough or wheeze OR Instead use albuterol 0.083% solution via nebulizer one unit vial every 4 hours as needed for cough or wheeze.   Can also use duoneb every 6 hours as needed.   Dupixent protocol, benefits and risk has previously been discussed with you and submission for approval previously made.  Will need you to talk to Tammy to arrange delivery of your medication so you can get started on this therapy.  Dupixent is a asthma and eczema/dermatitis medication that is done every 2 weeks and can be self-administered.  It can help provide asthma control and decrease need for systemic steroids like prednisone  Asthma control goals:  Full participation in all desired activities  (may need albuterol before activity) Albuterol use two time or less a week on average (not counting use with activity) Cough interfering with sleep two time or less a month Oral steroids no more than once a year No hospitalizations   Allergic rhinitis Continue allergen avoidance measures directed toward pollens, mold, dust mite, and pets as listed below Continue Xyzal 5 mg once a day.  You may take an additional Xyzal 5 mg once a day as needed for breakthrough symptoms Continue Flonase 2 sprays in each nostril once a day as needed for a stuffy nose. In the right nostril, point the applicator out toward the right ear. In the left nostril, point the applicator out toward the left ear Consider saline nasal rinses as needed for nasal symptoms. Use this before any medicated nasal sprays for best result If your symptoms are not well controlled by the plan as listed above, consider allergen immunotherapy when your breathing is under good control  Allergic conjunctivitis Some over the counter eye drops include Pataday one drop in each eye once a day as needed for red, itchy eyes OR Zaditor one drop in each eye twice a day as needed for red itchy eyes.  Dermatitis Continue as needed use of triamcinolone for itchy, patchy, dry, bumpy, irritated, scaly, flaky red areas Dupixant as above Recommend referral to dermatology if needed  Reflux Continue dietary and lifestyle modifications as listed below Continue protonix as directed   Follow up in 2-3 months or sooner if needed I appreciate the opportunity to take part in Savannah Pena's care. Please do not hesitate to contact me with questions.  Sincerely,   Margo Aye, MD Allergy/Immunology [facility]

## 2021-09-13 ENCOUNTER — Ambulatory Visit: Payer: Self-pay | Admitting: Urology

## 2021-09-27 DIAGNOSIS — E782 Mixed hyperlipidemia: Secondary | ICD-10-CM | POA: Diagnosis not present

## 2021-09-27 DIAGNOSIS — E1169 Type 2 diabetes mellitus with other specified complication: Secondary | ICD-10-CM | POA: Diagnosis not present

## 2021-10-02 ENCOUNTER — Telehealth: Payer: Self-pay

## 2021-10-02 NOTE — Telephone Encounter (Signed)
Sent in pa and it was instantly approved for breztri, pharmacy notified.

## 2021-10-30 ENCOUNTER — Other Ambulatory Visit: Payer: Self-pay

## 2021-10-30 ENCOUNTER — Ambulatory Visit (INDEPENDENT_AMBULATORY_CARE_PROVIDER_SITE_OTHER): Payer: Medicaid Other | Admitting: Allergy & Immunology

## 2021-10-30 ENCOUNTER — Encounter: Payer: Self-pay | Admitting: Allergy & Immunology

## 2021-10-30 VITALS — BP 142/98 | HR 95 | Temp 97.4°F | Resp 16 | Ht 64.0 in

## 2021-10-30 DIAGNOSIS — K219 Gastro-esophageal reflux disease without esophagitis: Secondary | ICD-10-CM | POA: Diagnosis not present

## 2021-10-30 DIAGNOSIS — J3089 Other allergic rhinitis: Secondary | ICD-10-CM | POA: Diagnosis not present

## 2021-10-30 DIAGNOSIS — J449 Chronic obstructive pulmonary disease, unspecified: Secondary | ICD-10-CM | POA: Diagnosis not present

## 2021-10-30 DIAGNOSIS — L309 Dermatitis, unspecified: Secondary | ICD-10-CM

## 2021-10-30 DIAGNOSIS — J302 Other seasonal allergic rhinitis: Secondary | ICD-10-CM | POA: Diagnosis not present

## 2021-10-30 NOTE — Progress Notes (Signed)
FOLLOW UP  Date of Service/Encounter:  10/30/21   Assessment:   Asthma-COPD overlap syndrome - poorly controlled   Seasonal and perennial allergic rhinitis (grasses, ragweed, weeds, trees, indoor molds, outdoor molds, dust mites, cat and dog)   Contact dermatitis - consider patch testing in the future   Elevated absolute eosinophil count (1000 in March 2022)   Disabled status  Plan/Recommendations:   1. Asthma-COPD overlap syndrome - Lung testing looked amazing today (increased from 49% up to 70%!). - We are not going to make any changes at this time.  - Daily controller medication(s): Breztri 2 puffs twice daily with spacer + Singulair (montelukast) 10 mg daily - Prior to physical activity: albuterol 2 puffs 10-15 minutes before physical activity. - Rescue medications: albuterol 4 puffs every 4-6 hours as needed or albuterol nebulizer one vial every 4-6 hours as needed - Asthma control goals:  * Full participation in all desired activities (may need albuterol before activity) * Albuterol use two time or less a week on average (not counting use with activity) * Cough interfering with sleep two time or less a month * Oral steroids no more than once a year * No hospitalizations  2. Seasonal and perennial allergic rhinitis (grasses, ragweed, weeds, trees, indoor molds, outdoor molds, dust mites, cat and dog) - Continue with: Singulair (montelukast) 10 mg daily - Continue taking: Xyzal (levocetirizine) 5mg  tablet once daily and Flonase (fluticasone) one spray per nostril daily - You can use an extra dose of the antihistamine, if needed, for breakthrough symptoms.  - Consider nasal saline rinses 1-2 times daily to remove allergens from the nasal cavities as well as help with mucous clearance (this is especially helpful to do before the nasal sprays are given) - Consider allergy shots as a means of long-term control.  3. Contact dermatitis - We are going to be starting Dupixent  for your asthma which might help your rash.  - Continue with the triamcinolone 0.1% ointment applied twice daily as needed.  4. Return in about 4 months (around 02/28/2022).   Subjective:   Savannah Pena is a 60 y.o. female presenting today for follow up of  Chief Complaint  Patient presents with   Follow-up    Since last visit, patient states she has been "the same." Patient has refused to take her weight today so I was not able to retreive that.    Savannah Pena has a history of the following: Patient Active Problem List   Diagnosis Date Noted   Seasonal and perennial allergic rhinitis 06/07/2021   Seasonal allergic conjunctivitis 06/07/2021   Contact dermatitis 06/07/2021   Gastroesophageal reflux disease 06/07/2021   DDD (degenerative disc disease), cervical 07/19/2018   Neck pain 07/19/2018   Osteopenia 07/19/2018   Takotsubo cardiomyopathy    Pure hypercholesterolemia    Cardiomyopathy, unspecified (HCC) 03/17/2018   NSTEMI (non-ST elevated myocardial infarction) (HCC) 03/17/2018   NICM (nonischemic cardiomyopathy) (HCC) 03/17/2018   Seizure (HCC) 03/16/2018   Asthma-COPD overlap syndrome (HCC) 03/16/2018   Elevated troponin    Diabetes mellitus without complication (HCC)    Attention deficit disorder (ADD)    Diabetes (HCC) 04/08/2017   Asthma 04/08/2017   Macular degeneration 04/08/2017   Chronic back pain 04/08/2017   MDD (major depressive disorder), recurrent episode, moderate (HCC) 04/08/2017   PTSD (post-traumatic stress disorder) 04/08/2017   Hyperoxaluria 03/29/2017   Ureteral stone 11/06/2016    History obtained from: chart review and patient.  Savannah Pena is a 60 y.o. female  presenting for a follow up visit.  She was last seen in October 2022 by Dr. Delorse Lek.  At that time, she was continued on montelukast 10 mg daily.  She was restarted on Breztri 2 puffs twice a day.  We also continue with albuterol.  Dupixent was discussed due to an elevated eosinophil count of  1000.  For her rhinitis, she was continued on Xyzal as well as Flonase and nasal saline rinses.  She was given triamcinolone for her dermatitis.  Since last visit, she is a little bit better.  Asthma/Respiratory Symptom History: She remains on the Breztri 2 puffs twice daily.  This combination seems to be working very well.  She has noticed improvement.  She has not been using her albuterol as much.  She has been able to be more active.  Allergic Rhinitis Symptom History: She remains on the Singulair 10 mg daily.  She has taken the Xyzal and the Flonase.  Allergy shots are still an option, but her breathing has been more about her breathing compared to her allergies.  Skin Symptom History: She has not started the Dupixent yet. She had it delivered to her house. She has not taken it yet. She is wondering what the best thing for her to do. [place] She remains anticoagulated, by coordinating the delivery of the problem.  She has not been having any delivered to our clinic for administration in our clinic.  The house where she is living is being sold.  She is fairly devastated because she is only paying $500 a month for rent there and her landlord allows her to keep her high resection is there as well.  I also felt sounds like a place that is going apart.  She tells me that her electric bills during the summer are up-to-date.  The heating pills during the winter to $390 per month are rather expensive as well.  Otherwise, there have been no changes to her past medical history, surgical history, family history, or social history.    Review of Systems  Constitutional: Negative.  Negative for chills, fever, malaise/fatigue and weight loss.  HENT: Negative.  Negative for congestion, ear discharge, ear pain and sinus pain.   Eyes:  Negative for pain, discharge and redness.  Respiratory:  Positive for cough and shortness of breath. Negative for sputum production and wheezing.    Cardiovascular: Negative.  Negative for chest pain and palpitations.  Gastrointestinal:  Negative for abdominal pain, constipation, diarrhea, heartburn, nausea and vomiting.  Skin: Negative.  Negative for itching and rash.  Neurological:  Negative for dizziness and headaches.  Endo/Heme/Allergies:  Positive for environmental allergies. Does not bruise/bleed easily.      Objective:   Blood pressure (!) 142/98, pulse 95, temperature (!) 97.4 F (36.3 C), temperature source Temporal, resp. rate 16, height 5\' 4"  (1.626 m), last menstrual period 12/06/2011, SpO2 97 %. Body mass index is 35.43 kg/m.   Physical Exam:  Physical Exam Constitutional:      Appearance: She is well-developed.     Comments: Talkative.   HENT:     Head: Normocephalic and atraumatic.     Right Ear: Tympanic membrane, ear canal and external ear normal. No drainage, swelling or tenderness. Tympanic membrane is not injected, scarred, erythematous, retracted or bulging.     Left Ear: Tympanic membrane, ear canal and external ear normal. No drainage, swelling or tenderness. Tympanic membrane is not injected, scarred, erythematous, retracted or bulging.  Nose: No nasal deformity, septal deviation, mucosal edema or rhinorrhea.     Right Turbinates: Enlarged, swollen and pale.     Left Turbinates: Enlarged, swollen and pale.     Right Sinus: No maxillary sinus tenderness or frontal sinus tenderness.     Left Sinus: No maxillary sinus tenderness or frontal sinus tenderness.     Comments: No nasal polyps.    Mouth/Throat:     Mouth: Mucous membranes are not pale and not dry.     Pharynx: Uvula midline.     Comments: Cobblestoning in the posterior oropharynx. Eyes:     General:        Right eye: No discharge.        Left eye: No discharge.     Conjunctiva/sclera: Conjunctivae normal.     Right eye: Right conjunctiva is not injected. No chemosis.    Left eye: Left conjunctiva is not injected. No chemosis.     Pupils: Pupils are equal, round, and reactive to light.  Cardiovascular:     Rate and Rhythm: Normal rate and regular rhythm.     Heart sounds: Normal heart sounds.  Pulmonary:     Effort: Pulmonary effort is normal. No tachypnea, accessory muscle usage or respiratory distress.     Breath sounds: Normal breath sounds. No wheezing, rhonchi or rales.     Comments: No increased work of breathing noted. Some decreased air movement at the bases.  There are some end expiratory wheezes present. Chest:     Chest wall: No tenderness.  Lymphadenopathy:     Head:     Right side of head: No submandibular, tonsillar or occipital adenopathy.     Left side of head: No submandibular, tonsillar or occipital adenopathy.     Cervical: No cervical adenopathy.  Skin:    General: Skin is warm.     Capillary Refill: Capillary refill takes less than 2 seconds.     Coloration: Skin is not pale.     Findings: No abrasion, erythema, petechiae or rash. Rash is not papular, urticarial or vesicular.     Comments: No eczematous or urticarial lesions noted.   Neurological:     Mental Status: She is alert.  Psychiatric:        Behavior: Behavior is cooperative.     Diagnostic studies:    Spirometry: results abnormal (FEV1: 1.76/70%, FVC: 2.31/75%, FEV1/FVC: 76%).    Spirometry consistent with possible restrictive disease.  Overall, values are markedly however movements are additional in June.   Allergy Studies: none        Malachi Bonds, MD  [facility]

## 2021-10-30 NOTE — Patient Instructions (Addendum)
1. Asthma-COPD overlap syndrome - Lung testing looked amazing today (increased from 49% up to 70%!). - We are not going to make any changes at this time.  - Daily controller medication(s): Breztri 2 puffs twice daily with spacer + Singulair (montelukast) 10 mg daily - Prior to physical activity: albuterol 2 puffs 10-15 minutes before physical activity. - Rescue medications: albuterol 4 puffs every 4-6 hours as needed or albuterol nebulizer one vial every 4-6 hours as needed - Asthma control goals:  * Full participation in all desired activities (may need albuterol before activity) * Albuterol use two time or less a week on average (not counting use with activity) * Cough interfering with sleep two time or less a month * Oral steroids no more than once a year * No hospitalizations  2. Seasonal and perennial allergic rhinitis (grasses, ragweed, weeds, trees, indoor molds, outdoor molds, dust mites, cat and dog) - Continue with: Singulair (montelukast) 10 mg daily - Continue taking: Xyzal (levocetirizine) 5mg  tablet once daily and Flonase (fluticasone) one spray per nostril daily - You can use an extra dose of the antihistamine, if needed, for breakthrough symptoms.  - Consider nasal saline rinses 1-2 times daily to remove allergens from the nasal cavities as well as help with mucous clearance (this is especially helpful to do before the nasal sprays are given) - Consider allergy shots as a means of long-term control.  3. Contact dermatitis - We are going to be starting Dupixent for your asthma which might help your rash.  - Continue with the triamcinolone 0.1% ointment applied twice daily as needed.  4. Return in about 4 months (around 02/28/2022).    Please inform 03/02/2022 of any Emergency Department visits, hospitalizations, or changes in symptoms. Call us before going to the ED for breathing or allergy symptoms since we might be able to fit you in for a sick visit. Feel free to contact us  anytime with any questions, problems, or concerns.  It was a pleasure to see you again today!  Websites that have reliable patient information: 1. American Academy of Asthma, Allergy, and Immunology: www.aaaai.org 2. Food Allergy Research and Education (FARE): foodallergy.org 3. Mothers of Asthmatics: http://www.asthmacommunitynetwork.org 4. American College of Allergy, Asthma, and Immunology: www.acaai.org   COVID-19 Vaccine Information can be found at: [place] For questions related to vaccine distribution or appointments, please email [email or web address] or call [phone].   We realize that you might be concerned about having an allergic reaction to the COVID19 vaccines. To help with that concern, WE ARE OFFERING THE COVID19 VACCINES IN OUR OFFICE! Ask the front desk for dates!     Like [phone] on [place] and Instagram for our latest updates!      A healthy democracy works best when [employer] participate! Make sure you are registered to vote! If you have moved or changed any of your contact information, you will need to get this updated before voting!  In some cases, you MAY be able to register to vote online: [employer]

## 2021-10-31 ENCOUNTER — Encounter: Payer: Self-pay | Admitting: Allergy & Immunology

## 2021-10-31 ENCOUNTER — Telehealth: Payer: Self-pay | Admitting: *Deleted

## 2021-10-31 NOTE — Telephone Encounter (Signed)
-----   Message from Alfonse Spruce, MD sent at 10/31/2021  5:28 AM EST ----- Hi there!  This patient prefers to have her medication delivered directly to our office and we can administer it in our office.  She said getting cell reception and being home to coordinate delivery of the Dupixent is going to be a problem.  She is currently starting her Dupixent in the [place] office in the next few weeks.  She has her loading dose at home and will bring it with her.

## 2021-10-31 NOTE — Telephone Encounter (Signed)
Thanks, Tam Tam!   Dashana Guizar, MD [facility]  

## 2021-10-31 NOTE — Telephone Encounter (Signed)
L/M for patientto advise she will need to indicate to [facility] she wants same delivered to [facility] clinic for admin. I also advised I do not reorder Dupixent for patients that is up to her. I advised if she has any questions she can reach out to me

## 2021-11-08 ENCOUNTER — Other Ambulatory Visit: Payer: Self-pay | Admitting: Allergy & Immunology

## 2021-11-22 ENCOUNTER — Other Ambulatory Visit: Payer: Self-pay

## 2021-11-22 ENCOUNTER — Ambulatory Visit (INDEPENDENT_AMBULATORY_CARE_PROVIDER_SITE_OTHER): Payer: Medicaid Other

## 2021-11-22 DIAGNOSIS — J4550 Severe persistent asthma, uncomplicated: Secondary | ICD-10-CM

## 2021-11-22 MED ORDER — EPINEPHRINE 0.3 MG/0.3ML IJ SOAJ: 0.3000 mg | Freq: Once | INTRAMUSCULAR | 2 refills | Status: AC

## 2021-11-22 MED ORDER — DUPILUMAB 300 MG/2ML ~~LOC~~ SOSY
600.0000 mg | PREFILLED_SYRINGE | Freq: Once | SUBCUTANEOUS | Status: AC
  Administered 2021-11-22: 600 mg via SUBCUTANEOUS

## 2021-12-06 ENCOUNTER — Other Ambulatory Visit: Payer: Self-pay

## 2021-12-06 ENCOUNTER — Ambulatory Visit (INDEPENDENT_AMBULATORY_CARE_PROVIDER_SITE_OTHER): Payer: Medicaid Other

## 2021-12-06 DIAGNOSIS — J4550 Severe persistent asthma, uncomplicated: Secondary | ICD-10-CM | POA: Diagnosis not present

## 2021-12-06 MED ORDER — DUPILUMAB 300 MG/2ML ~~LOC~~ SOSY
300.0000 mg | PREFILLED_SYRINGE | SUBCUTANEOUS | Status: AC
  Administered 2021-12-06 – 2022-04-14 (×9): 300 mg via SUBCUTANEOUS

## 2021-12-16 ENCOUNTER — Other Ambulatory Visit: Payer: Self-pay | Admitting: Allergy & Immunology

## 2021-12-25 ENCOUNTER — Other Ambulatory Visit: Payer: Self-pay

## 2021-12-25 ENCOUNTER — Ambulatory Visit (INDEPENDENT_AMBULATORY_CARE_PROVIDER_SITE_OTHER): Payer: Medicaid Other | Admitting: *Deleted

## 2021-12-25 DIAGNOSIS — J4550 Severe persistent asthma, uncomplicated: Secondary | ICD-10-CM | POA: Diagnosis not present

## 2022-01-08 ENCOUNTER — Other Ambulatory Visit: Payer: Self-pay

## 2022-01-08 ENCOUNTER — Ambulatory Visit (INDEPENDENT_AMBULATORY_CARE_PROVIDER_SITE_OTHER): Payer: Medicaid Other

## 2022-01-08 DIAGNOSIS — J4550 Severe persistent asthma, uncomplicated: Secondary | ICD-10-CM

## 2022-01-14 DIAGNOSIS — E782 Mixed hyperlipidemia: Secondary | ICD-10-CM | POA: Diagnosis not present

## 2022-01-14 DIAGNOSIS — E1169 Type 2 diabetes mellitus with other specified complication: Secondary | ICD-10-CM | POA: Diagnosis not present

## 2022-01-15 ENCOUNTER — Ambulatory Visit: Payer: Medicaid Other

## 2022-01-17 ENCOUNTER — Telehealth: Payer: Self-pay | Admitting: Allergy & Immunology

## 2022-01-17 NOTE — Telephone Encounter (Signed)
We received labs from PCP, Dr. Dwana Melena.  Her hemoglobin A1c was normal.  Her triglycerides were normal.  Metabolic panel was normal.  ALT was slightly elevated and potassium was slightly elevated.  A complete blood count demonstrated eosinophils of 700.  These labs were drawn in March 2023.  They will be scanned into the system. ? ?Her eosinophils are high and she is on Dupixent, but they were initially 1000.  They have gone down slightly.  We also know that there is a transient increase in eosinophils after starting Dupixent. ? ?Malachi Bonds, MD ?[facility] ? ?

## 2022-01-22 ENCOUNTER — Ambulatory Visit (INDEPENDENT_AMBULATORY_CARE_PROVIDER_SITE_OTHER): Payer: Medicaid Other | Admitting: *Deleted

## 2022-01-22 ENCOUNTER — Other Ambulatory Visit: Payer: Self-pay

## 2022-01-22 DIAGNOSIS — J4550 Severe persistent asthma, uncomplicated: Secondary | ICD-10-CM | POA: Diagnosis not present

## 2022-01-31 DIAGNOSIS — M79643 Pain in unspecified hand: Secondary | ICD-10-CM | POA: Diagnosis not present

## 2022-01-31 DIAGNOSIS — M7989 Other specified soft tissue disorders: Secondary | ICD-10-CM | POA: Diagnosis not present

## 2022-02-05 ENCOUNTER — Other Ambulatory Visit: Payer: Self-pay

## 2022-02-05 ENCOUNTER — Ambulatory Visit (INDEPENDENT_AMBULATORY_CARE_PROVIDER_SITE_OTHER): Payer: Medicaid Other

## 2022-02-05 ENCOUNTER — Ambulatory Visit: Payer: Medicaid Other

## 2022-02-05 DIAGNOSIS — J4550 Severe persistent asthma, uncomplicated: Secondary | ICD-10-CM | POA: Diagnosis not present

## 2022-02-19 ENCOUNTER — Ambulatory Visit: Payer: Medicaid Other

## 2022-02-20 ENCOUNTER — Other Ambulatory Visit: Payer: Self-pay | Admitting: Allergy

## 2022-02-25 DIAGNOSIS — Z0120 Encounter for dental examination and cleaning without abnormal findings: Secondary | ICD-10-CM | POA: Diagnosis not present

## 2022-02-26 DIAGNOSIS — G5612 Other lesions of median nerve, left upper limb: Secondary | ICD-10-CM | POA: Diagnosis not present

## 2022-02-28 ENCOUNTER — Ambulatory Visit: Payer: Medicaid Other | Admitting: Allergy & Immunology

## 2022-02-28 ENCOUNTER — Encounter: Payer: Self-pay | Admitting: Allergy & Immunology

## 2022-02-28 ENCOUNTER — Ambulatory Visit (INDEPENDENT_AMBULATORY_CARE_PROVIDER_SITE_OTHER): Payer: Medicaid Other

## 2022-02-28 VITALS — BP 130/84 | HR 78 | Temp 97.8°F | Resp 16

## 2022-02-28 DIAGNOSIS — J449 Chronic obstructive pulmonary disease, unspecified: Secondary | ICD-10-CM

## 2022-02-28 DIAGNOSIS — J4550 Severe persistent asthma, uncomplicated: Secondary | ICD-10-CM

## 2022-02-28 DIAGNOSIS — J3089 Other allergic rhinitis: Secondary | ICD-10-CM | POA: Diagnosis not present

## 2022-02-28 DIAGNOSIS — J302 Other seasonal allergic rhinitis: Secondary | ICD-10-CM

## 2022-02-28 NOTE — Progress Notes (Signed)
? ?FOLLOW UP ? ?Date of Service/Encounter:  02/28/22 ? ? ?Assessment:  ? ?Asthma-COPD overlap syndrome - poorly controlled ?  ?Seasonal and perennial allergic rhinitis (grasses, ragweed, weeds, trees, indoor molds, outdoor molds, dust mites, cat and dog) ?  ?Contact dermatitis - consider patch testing in the future ?  ?Elevated absolute eosinophil count (1000 in March 2022) ?  ?Disabled status ?  ?Plan/Recommendations:  ? ?1. Asthma-COPD overlap syndrome ?- Lung testing looked amazing today. ?- I think we are headed in the right direction. ?- I would like you to try to limit the albuterol use since getting all of the albuterol exposure on the lungs make them LESS response to the albuterol when you have asthma exacerbations. ?- Do you best to decrease the albuterol use.  ?- Daily controller medication(s): Breztri 2 puffs twice daily with spacer + Singulair (montelukast) 10 mg daily + Dupixent 300mg  every two weeks  ?- Prior to physical activity: albuterol 2 puffs 10-15 minutes before physical activity. ?- Rescue medications: albuterol 4 puffs every 4-6 hours as needed or albuterol nebulizer one vial every 4-6 hours as needed ?- Asthma control goals:  ?* Full participation in all desired activities (may need albuterol before activity) ?* Albuterol use two time or less a week on average (not counting use with activity) ?* Cough interfering with sleep two time or less a month ?* Oral steroids no more than once a year ?* No hospitalizations ? ?2. Seasonal and perennial allergic rhinitis (grasses, ragweed, weeds, trees, indoor molds, outdoor molds, dust mites, cat and dog) ?- Continue with: Singulair (montelukast) 10 mg daily ?- Continue with: Xyzal (levocetirizine) 5mg  tablet once daily and Flonase (fluticasone) one spray per nostril daily ?- You can use an extra dose of the antihistamine, if needed, for breakthrough symptoms.  ?- Consider nasal saline rinses 1-2 times daily to remove allergens from the nasal cavities  as well as help with mucous clearance (this is especially helpful to do before the nasal sprays are given) ?- Consider allergy shots as a means of long-term control. ? ?3. Contact dermatitis ?- Continue with the triamcinolone 0.1% ointment applied twice daily as needed. ?- Hopefully the Dupixent every two weeks.  ? ?4. Return in about 6 months (around 08/30/2022).  ? ? ?Subjective:  ? ?Savannah Pena is a 60 y.o. female presenting today for follow up of  ?Chief Complaint  ?Patient presents with  ? Asthma  ?  Breathing has been okay, goes through 2 albuterol inhalers a month  ? Allergies  ?  Itching is much better  ? ? ?Savannah Pena has a history of the following: ?Patient Active Problem List  ? Diagnosis Date Noted  ? Seasonal and perennial allergic rhinitis 06/07/2021  ? Seasonal allergic conjunctivitis 06/07/2021  ? Contact dermatitis 06/07/2021  ? Gastroesophageal reflux disease 06/07/2021  ? DDD (degenerative disc disease), cervical 07/19/2018  ? Neck pain 07/19/2018  ? Osteopenia 07/19/2018  ? Takotsubo cardiomyopathy   ? Pure hypercholesterolemia   ? Cardiomyopathy, unspecified (HCC) 03/17/2018  ? NSTEMI (non-ST elevated myocardial infarction) (HCC) 03/17/2018  ? NICM (nonischemic cardiomyopathy) (HCC) 03/17/2018  ? Seizure (HCC) 03/16/2018  ? Asthma-COPD overlap syndrome (HCC) 03/16/2018  ? Elevated troponin   ? Diabetes mellitus without complication (HCC)   ? Attention deficit disorder (ADD)   ? Diabetes (HCC) 04/08/2017  ? Asthma 04/08/2017  ? Macular degeneration 04/08/2017  ? Chronic back pain 04/08/2017  ? MDD (major depressive disorder), recurrent episode, moderate (HCC) 04/08/2017  ? PTSD (  post-traumatic stress disorder) 04/08/2017  ? Hyperoxaluria 03/29/2017  ? Ureteral stone 11/06/2016  ? ? ?History obtained from: chart review and patient. ? ?Savannah Pena is a 61 y.o. female presenting for a follow up visit.  She was last seen in December 2020.  At that time, she went from 49% to 70%.  We continue with Breztri 2  puffs twice daily as well as Singulair.  For her rhinitis, we continue with Singulair as well as Xyzal and Flonase.  For her contact dermatitis, we looked into starting Dupixent for the asthma which I felt might help control her rash as well. ? ?In the interim, she has started Dupixent every 2 weeks. ? ?Since the last visit, she has done well.  ? ?Asthma/Respiratory Symptom History: She remains on her Breztri and Singulair. She is going through two albuterol inhalers per month.  She feels that the  addition of the Dupixent has been helpful in controlling her shortness of breath. She has not in the hospital at all.  She has not been in the emergency room and has not been on prednisone. ? ?Allergic Rhinitis Symptom History: She remains on the Xyzal and the Flonase. She is using the Xyzal twice daily. She is using the eye drops and this is working well.  Her symptoms continue to be an issue, but the Xyzal and the Flonase certainly helped. ? ?Skin Symptom History: She is having less itching. Rashes are improving overall. She thinks that the addition of the Dupixent has worked well.  ? ?She is still stressed about being thrown out of her home.  Her landlord is still convinced that she needs to sell the property, but there have been no triggers so far. ? ?Otherwise, there have been no changes to her past medical history, surgical history, family history, or social history. ? ? ? ?Review of Systems  ?Constitutional: Negative.  Negative for chills, fever, malaise/fatigue and weight loss.  ?HENT: Negative.  Negative for congestion, ear discharge, ear pain and sinus pain.   ?Eyes:  Negative for pain, discharge and redness.  ?Respiratory:  Positive for cough and shortness of breath. Negative for sputum production and wheezing.   ?Cardiovascular: Negative.  Negative for chest pain and palpitations.  ?Gastrointestinal:  Negative for abdominal pain, constipation, diarrhea, heartburn, nausea and vomiting.  ?Skin: Negative.   Negative for itching and rash.  ?Neurological:  Negative for dizziness and headaches.  ?Endo/Heme/Allergies:  Positive for environmental allergies. Does not bruise/bleed easily.   ? ? ? ?Objective:  ? ?Blood pressure 130/84, pulse 78, temperature 97.8 ?F (36.6 ?C), temperature source Temporal, resp. rate 16, last menstrual period 12/06/2011, SpO2 97 %. ?There is no height or weight on file to calculate BMI. ? ? ? ?Physical Exam ?Vitals reviewed.  ?Constitutional:   ?   Appearance: She is well-developed.  ?   Comments: Extremely talkative.  ?HENT:  ?   Head: Normocephalic and atraumatic.  ?   Right Ear: Tympanic membrane, ear canal and external ear normal. No drainage, swelling or tenderness. Tympanic membrane is not injected, scarred, erythematous, retracted or bulging.  ?   Left Ear: Tympanic membrane, ear canal and external ear normal. No drainage, swelling or tenderness. Tympanic membrane is not injected, scarred, erythematous, retracted or bulging.  ?   Nose: No nasal deformity, septal deviation, mucosal edema or rhinorrhea.  ?   Right Turbinates: Enlarged, swollen and pale.  ?   Left Turbinates: Enlarged, swollen and pale.  ?   Right Sinus: No maxillary  sinus tenderness or frontal sinus tenderness.  ?   Left Sinus: No maxillary sinus tenderness or frontal sinus tenderness.  ?   Comments: No nasal polyps. ?   Mouth/Throat:  ?   Mouth: Mucous membranes are not pale and not dry.  ?   Pharynx: Uvula midline.  ?   Comments: Cobblestoning in the posterior oropharynx. ?Eyes:  ?   General:     ?   Right eye: No discharge.     ?   Left eye: No discharge.  ?   Conjunctiva/sclera: Conjunctivae normal.  ?   Right eye: Right conjunctiva is not injected. No chemosis. ?   Left eye: Left conjunctiva is not injected. No chemosis. ?   Pupils: Pupils are equal, round, and reactive to light.  ?Cardiovascular:  ?   Rate and Rhythm: Normal rate and regular rhythm.  ?   Heart sounds: Normal heart sounds.  ?Pulmonary:  ?   Effort:  Pulmonary effort is normal. No tachypnea, accessory muscle usage or respiratory distress.  ?   Breath sounds: Normal breath sounds. No wheezing, rhonchi or rales.  ?   Comments: No increased work of breathing

## 2022-02-28 NOTE — Patient Instructions (Addendum)
1. Asthma-COPD overlap syndrome ?- Lung testing looked amazing today. ?- I think we are headed in the right direction. ?- I would like you to try to limit the albuterol use since getting all of the albuterol exposure on the lungs make them LESS response to the albuterol when you have asthma exacerbations. ?- Do you best to decrease the albuterol use.  ?- Daily controller medication(s): Breztri 2 puffs twice daily with spacer + Singulair (montelukast) 10 mg daily + Dupixent 300mg  every two weeks  ?- Prior to physical activity: albuterol 2 puffs 10-15 minutes before physical activity. ?- Rescue medications: albuterol 4 puffs every 4-6 hours as needed or albuterol nebulizer one vial every 4-6 hours as needed ?- Asthma control goals:  ?* Full participation in all desired activities (may need albuterol before activity) ?* Albuterol use two time or less a week on average (not counting use with activity) ?* Cough interfering with sleep two time or less a month ?* Oral steroids no more than once a year ?* No hospitalizations ? ?2. Seasonal and perennial allergic rhinitis (grasses, ragweed, weeds, trees, indoor molds, outdoor molds, dust mites, cat and dog) ?- Continue with: Singulair (montelukast) 10 mg daily ?- Continue with: Xyzal (levocetirizine) 5mg  tablet once daily and Flonase (fluticasone) one spray per nostril daily ?- You can use an extra dose of the antihistamine, if needed, for breakthrough symptoms.  ?- Consider nasal saline rinses 1-2 times daily to remove allergens from the nasal cavities as well as help with mucous clearance (this is especially helpful to do before the nasal sprays are given) ?- Consider allergy shots as a means of long-term control. ? ?3. Contact dermatitis ?- Continue with the triamcinolone 0.1% ointment applied twice daily as needed. ?- Hopefully the Dupixent every two weeks.  ? ?4. Return in about 6 months (around 08/30/2022).  ? ? ?Please inform of any Emergency Department visits,  hospitalizations, or changes in symptoms. Call 09/01/2022 before going to the ED for breathing or allergy symptoms since we might be able to fit you in for a sick visit. Feel free to contact us anytime with any questions, problems, or concerns. ? ?It was a pleasure to see you again today! ? ?Websites that have reliable patient information: ?1. American Academy of Asthma, Allergy, and Immunology: www.aaaai.org ?2. Food Allergy Research and Education (FARE): foodallergy.org ?3. Mothers of Asthmatics: http://www.asthmacommunitynetwork.org ?4. [place] of Allergy, Asthma, and Immunology: [place] ? ? ?COVID-19 Vaccine Information can be found at: [employer] For questions related to vaccine distribution or appointments, please email [email or web address] or call [phone].  ? ?We realize that you might be concerned about having an allergic reaction to the COVID19 vaccines. To help with that concern, WE ARE OFFERING THE COVID19 VACCINES IN OUR OFFICE! Ask the front desk for dates!  ? ? ? ??Like? [employer] on Facebook and Instagram for our latest updates!  ?  ? ? ?A healthy democracy works best when [phone] participate! Make sure you are registered to vote! If you have moved or changed any of your contact information, you will need to get this updated before voting! ? ?In some cases, you MAY be able to register to vote online: [place] ? ? ? ? ? ? ? ? ? ?

## 2022-03-03 DIAGNOSIS — G5602 Carpal tunnel syndrome, left upper limb: Secondary | ICD-10-CM | POA: Diagnosis not present

## 2022-03-03 DIAGNOSIS — M62542 Muscle wasting and atrophy, not elsewhere classified, left hand: Secondary | ICD-10-CM | POA: Diagnosis not present

## 2022-03-13 ENCOUNTER — Other Ambulatory Visit: Payer: Self-pay | Admitting: Allergy & Immunology

## 2022-03-14 ENCOUNTER — Telehealth: Payer: Self-pay

## 2022-03-14 ENCOUNTER — Ambulatory Visit (INDEPENDENT_AMBULATORY_CARE_PROVIDER_SITE_OTHER): Payer: Medicaid Other

## 2022-03-14 DIAGNOSIS — J4550 Severe persistent asthma, uncomplicated: Secondary | ICD-10-CM | POA: Diagnosis not present

## 2022-03-14 MED ORDER — TRIAMCINOLONE ACETONIDE 0.1 % EX CREA: 1.0000 "application " | TOPICAL_CREAM | Freq: Two times a day (BID) | CUTANEOUS | 1 refills | Status: DC

## 2022-03-14 NOTE — Telephone Encounter (Signed)
I sent it in the requested changed.  ? ?Malachi Bonds, MD ?[facility] ? ?

## 2022-03-14 NOTE — Telephone Encounter (Signed)
Patient stopped by today to see if Dr Dellis Anes could changed the triamcinolone ointment  to the cream version?  ?

## 2022-03-26 ENCOUNTER — Ambulatory Visit: Payer: Medicaid Other

## 2022-03-27 ENCOUNTER — Other Ambulatory Visit: Payer: Self-pay | Admitting: Allergy

## 2022-03-28 ENCOUNTER — Other Ambulatory Visit: Payer: Self-pay | Admitting: Allergy

## 2022-03-31 ENCOUNTER — Ambulatory Visit (INDEPENDENT_AMBULATORY_CARE_PROVIDER_SITE_OTHER): Payer: Medicaid Other

## 2022-03-31 DIAGNOSIS — J4550 Severe persistent asthma, uncomplicated: Secondary | ICD-10-CM

## 2022-04-14 ENCOUNTER — Ambulatory Visit (INDEPENDENT_AMBULATORY_CARE_PROVIDER_SITE_OTHER): Payer: Medicaid Other

## 2022-04-14 DIAGNOSIS — J4550 Severe persistent asthma, uncomplicated: Secondary | ICD-10-CM | POA: Diagnosis not present

## 2022-04-28 ENCOUNTER — Ambulatory Visit: Payer: Medicaid Other

## 2022-05-06 ENCOUNTER — Other Ambulatory Visit: Payer: Self-pay | Admitting: Family Medicine

## 2022-06-09 DIAGNOSIS — M940 Chondrocostal junction syndrome [Tietze]: Secondary | ICD-10-CM | POA: Diagnosis not present

## 2022-06-13 DIAGNOSIS — E782 Mixed hyperlipidemia: Secondary | ICD-10-CM | POA: Diagnosis not present

## 2022-06-13 DIAGNOSIS — E1169 Type 2 diabetes mellitus with other specified complication: Secondary | ICD-10-CM | POA: Diagnosis not present

## 2022-06-17 DIAGNOSIS — G40509 Epileptic seizures related to external causes, not intractable, without status epilepticus: Secondary | ICD-10-CM | POA: Diagnosis not present

## 2022-06-17 DIAGNOSIS — Z6839 Body mass index (BMI) 39.0-39.9, adult: Secondary | ICD-10-CM | POA: Diagnosis not present

## 2022-06-17 DIAGNOSIS — E1169 Type 2 diabetes mellitus with other specified complication: Secondary | ICD-10-CM | POA: Diagnosis not present

## 2022-06-17 DIAGNOSIS — M542 Cervicalgia: Secondary | ICD-10-CM | POA: Diagnosis not present

## 2022-06-17 DIAGNOSIS — G4730 Sleep apnea, unspecified: Secondary | ICD-10-CM | POA: Diagnosis not present

## 2022-06-17 DIAGNOSIS — J4551 Severe persistent asthma with (acute) exacerbation: Secondary | ICD-10-CM | POA: Diagnosis not present

## 2022-06-17 DIAGNOSIS — F909 Attention-deficit hyperactivity disorder, unspecified type: Secondary | ICD-10-CM | POA: Diagnosis not present

## 2022-06-17 DIAGNOSIS — N3281 Overactive bladder: Secondary | ICD-10-CM | POA: Diagnosis not present

## 2022-06-17 DIAGNOSIS — F331 Major depressive disorder, recurrent, moderate: Secondary | ICD-10-CM | POA: Diagnosis not present

## 2022-06-17 DIAGNOSIS — I10 Essential (primary) hypertension: Secondary | ICD-10-CM | POA: Diagnosis not present

## 2022-06-17 DIAGNOSIS — E782 Mixed hyperlipidemia: Secondary | ICD-10-CM | POA: Diagnosis not present

## 2022-07-11 ENCOUNTER — Inpatient Hospital Stay: Payer: Medicaid Other | Attending: Hematology | Admitting: Hematology

## 2022-07-11 ENCOUNTER — Inpatient Hospital Stay: Payer: Medicaid Other

## 2022-07-11 VITALS — BP 133/78 | HR 91 | Temp 97.5°F | Resp 18 | Ht 64.0 in

## 2022-07-11 DIAGNOSIS — R160 Hepatomegaly, not elsewhere classified: Secondary | ICD-10-CM | POA: Insufficient documentation

## 2022-07-11 DIAGNOSIS — R519 Headache, unspecified: Secondary | ICD-10-CM | POA: Insufficient documentation

## 2022-07-11 DIAGNOSIS — R42 Dizziness and giddiness: Secondary | ICD-10-CM | POA: Insufficient documentation

## 2022-07-11 DIAGNOSIS — D72828 Other elevated white blood cell count: Secondary | ICD-10-CM | POA: Diagnosis present

## 2022-07-11 DIAGNOSIS — L259 Unspecified contact dermatitis, unspecified cause: Secondary | ICD-10-CM | POA: Diagnosis not present

## 2022-07-11 DIAGNOSIS — J3089 Other allergic rhinitis: Secondary | ICD-10-CM | POA: Insufficient documentation

## 2022-07-11 DIAGNOSIS — J449 Chronic obstructive pulmonary disease, unspecified: Secondary | ICD-10-CM | POA: Diagnosis not present

## 2022-07-11 DIAGNOSIS — I10 Essential (primary) hypertension: Secondary | ICD-10-CM | POA: Diagnosis not present

## 2022-07-11 DIAGNOSIS — F17210 Nicotine dependence, cigarettes, uncomplicated: Secondary | ICD-10-CM | POA: Insufficient documentation

## 2022-07-11 DIAGNOSIS — L299 Pruritus, unspecified: Secondary | ICD-10-CM | POA: Diagnosis not present

## 2022-07-11 DIAGNOSIS — Z808 Family history of malignant neoplasm of other organs or systems: Secondary | ICD-10-CM | POA: Insufficient documentation

## 2022-07-11 DIAGNOSIS — Z800 Family history of malignant neoplasm of digestive organs: Secondary | ICD-10-CM | POA: Diagnosis not present

## 2022-07-11 DIAGNOSIS — D72829 Elevated white blood cell count, unspecified: Secondary | ICD-10-CM | POA: Insufficient documentation

## 2022-07-11 DIAGNOSIS — E119 Type 2 diabetes mellitus without complications: Secondary | ICD-10-CM | POA: Diagnosis not present

## 2022-07-11 DIAGNOSIS — Z8042 Family history of malignant neoplasm of prostate: Secondary | ICD-10-CM | POA: Insufficient documentation

## 2022-07-11 DIAGNOSIS — Z7951 Long term (current) use of inhaled steroids: Secondary | ICD-10-CM | POA: Insufficient documentation

## 2022-07-11 LAB — CBC WITH DIFFERENTIAL/PLATELET
Abs Immature Granulocytes: 0.02 10*3/uL (ref 0.00–0.07)
Basophils Absolute: 0.1 10*3/uL (ref 0.0–0.1)
Basophils Relative: 1 %
Eosinophils Absolute: 0.3 10*3/uL (ref 0.0–0.5)
Eosinophils Relative: 3 %
HCT: 41.8 % (ref 36.0–46.0)
Hemoglobin: 14.2 g/dL (ref 12.0–15.0)
Immature Granulocytes: 0 %
Lymphocytes Relative: 32 %
Lymphs Abs: 2.7 10*3/uL (ref 0.7–4.0)
MCH: 31.1 pg (ref 26.0–34.0)
MCHC: 34 g/dL (ref 30.0–36.0)
MCV: 91.7 fL (ref 80.0–100.0)
Monocytes Absolute: 0.6 10*3/uL (ref 0.1–1.0)
Monocytes Relative: 7 %
Neutro Abs: 4.9 10*3/uL (ref 1.7–7.7)
Neutrophils Relative %: 57 %
Platelets: 339 10*3/uL (ref 150–400)
RBC: 4.56 MIL/uL (ref 3.87–5.11)
RDW: 12.2 % (ref 11.5–15.5)
WBC: 8.5 10*3/uL (ref 4.0–10.5)
nRBC: 0 % (ref 0.0–0.2)

## 2022-07-11 LAB — LACTATE DEHYDROGENASE: LDH: 116 U/L (ref 98–192)

## 2022-07-11 NOTE — Patient Instructions (Addendum)
[facility]  Discharge Instructions  You were seen and examined today by Dr. Ellin Saba. Dr. Ellin Saba is a hematologist, meaning that he specializes in blood abnormalities. Dr. Ellin Saba discussed your past medical history, family history of cancers/blood conditions and the events that led to you being here today.  You were referred to Dr. Ellin Saba due to elevated white blood cells. Dr. Ellin Saba has ordered additional testing to assess further. Most likely, it is related to medication that you are on.  Follow-up as scheduled.  Thank you for choosing  [facility] to provide your oncology and hematology care.   To afford each patient quality time with our provider, please arrive at least 15 minutes before your scheduled appointment time. You may need to reschedule your appointment if you arrive late (10 or more minutes). Arriving late affects you and other patients whose appointments are after yours.  Also, if you miss three or more appointments without notifying the office, you may be dismissed from the clinic at the provider's discretion.    Again, thank you for choosing [facility].  Our hope is that these requests will decrease the amount of time that you wait before being seen by our physicians.   If you have a lab appointment with the Cancer Center please come in thru the Main Entrance and check in at the main information desk.           _____________________________________________________________  Should you have questions after your visit to [facility], please contact our office at [phone] and follow the prompts.  Our office hours are 8:00 a.m. to 4:30 p.m. Monday - Thursday and 8:00 a.m. to 2:30 p.m. Friday.  Please note that voicemails left after 4:00 p.m. may not be returned until the following business day.  We are closed weekends and all major holidays.  You do have access to a nurse 24-7,  just call the main number to the clinic [phone] and do not press any options, hold on the line and a nurse will answer the phone.    For prescription refill requests, have your pharmacy contact our office and allow 72 hours.    Masks are optional in the cancer centers. If you would like for your care team to wear a mask while they are taking care of you, please let them know. You may have one support person who is at least 60 years old accompany you for your appointments.

## 2022-07-11 NOTE — Progress Notes (Signed)
CONSULT NOTE  Patient Care Team: Benita Stabile, MD as PCP - General (Internal Medicine) Wyline Mood Dorothe Pea, MD as PCP - Cardiology (Cardiology) Doreatha Massed, MD as Medical Oncologist (Hematology)  CHIEF COMPLAINTS/PURPOSE OF CONSULTATION:  Leukocytosis.  HISTORY OF PRESENTING ILLNESS:  Savannah Pena 61 y.o. female is seen in consultation today at the request of Dr. Margo Aye for elevated white count.  Her recent white count 3 weeks ago was reportedly elevated at 16,000.  She reports that she had a history of leukocytosis for the last 10 years.  It was predominantly increase in neutrophils and eosinophils.  She reports that she feels tired all the time.  No specific B symptoms.  She has chronic rash on the left arm which is stable.  She has been on Breztri inhaler for the last 1 year.  Last systemic steroid was around a year ago.  She denies any recurrent infections.  She smokes cigarettes only when she drinks a lot.    MEDICAL HISTORY:  Past Medical History:  Diagnosis Date   Anxiety    Arthritis    Asthma    Attention deficit disorder (ADD)    COPD (chronic obstructive pulmonary disease) (HCC)    Diabetes mellitus without complication (HCC)    Dyspnea    with exertion   GERD (gastroesophageal reflux disease)    History of kidney stones    Hypertension    Kidney stone    Major depression    Osteoporosis    Seizures (HCC)    last seizure 23 years ago   Sleep apnea    has C-PAP, but does not currently use   Takotsubo cardiomyopathy    a. EF 35-40% by echo in 03/2018 with cath showing mild, nonobstructive CAD.     SURGICAL HISTORY: Past Surgical History:  Procedure Laterality Date   CESAREAN SECTION     CYSTOSCOPY WITH RETROGRADE PYELOGRAM, URETEROSCOPY AND STENT PLACEMENT Right 12/05/2016   Procedure: CYSTOSCOPY WITH RETROGRADE PYELOGRAM, URETEROSCOPY AND STENT PLACEMENT;  Surgeon: Sebastian Ache, MD;  Location: [facility];  Service: Urology;  Laterality: Right;    CYSTOSCOPY WITH STENT PLACEMENT Right 11/06/2016   Procedure: CYSTOSCOPY WITH STENT PLACEMENT;  Surgeon: Bjorn Pippin, MD;  Location: [facility];  Service: Urology;  Laterality: Right;   FLEXOR TENDON REPAIR Right    foot   KIDNEY STONE SURGERY     LEFT HEART CATH AND CORONARY ANGIOGRAPHY N/A 03/17/2018   Procedure: LEFT HEART CATH AND CORONARY ANGIOGRAPHY;  Surgeon: Kathleene Hazel, MD;  Location: [facility];  Service: Cardiovascular;  Laterality: N/A;    SOCIAL HISTORY: Social History   Socioeconomic History   Marital status: Single    Spouse name: Not on file   Number of children: Not on file   Years of education: Not on file   Highest education level: Not on file  Occupational History   Not on file  Tobacco Use   Smoking status: Former    Types: Cigarettes    Quit date: 11/12/2016    Years since quitting: 5.6   Smokeless tobacco: Former   Tobacco comments:    04-08-2017 per pt stopped in Jan 2018  Vaping Use   Vaping Use: Never used  Substance and Sexual Activity   Alcohol use: Yes    Comment: occassional, 04-08-2017 per pt occas.   Drug use: No    Comment: 04-08-2017 per pt no    Sexual activity: Yes    Birth control/protection: Post-menopausal  Other Topics  Concern   Not on file  Social History Narrative   Not on file   Social Determinants of Health   Financial Resource Strain: Not on file  Food Insecurity: Not on file  Transportation Needs: Not on file  Physical Activity: Not on file  Stress: Not on file  Social Connections: Not on file  Intimate Partner Violence: Not on file    FAMILY HISTORY: Family History  Problem Relation Age of Onset   Prostate cancer Father    Diabetes Father     ALLERGIES:  has no active allergies.  MEDICATIONS:  Current Outpatient Medications  Medication Sig Dispense Refill   montelukast (SINGULAIR) 10 MG tablet TAKE 1 TABLET BY MOUTH ONCE DAILY. 30 tablet 4   acetaminophen (TYLENOL) 500 MG tablet Take 500 mg by  mouth every 6 (six) hours as needed.     albuterol (VENTOLIN HFA) 108 (90 Base) MCG/ACT inhaler Inhale 1-2 puffs into the lungs every 6 (six) hours as needed for wheezing or shortness of breath. 18 g 0   amphetamine-dextroamphetamine (ADDERALL) 15 MG tablet Take 1 tablet by mouth daily.     aspirin EC 81 MG EC tablet Take 1 tablet (81 mg total) by mouth daily.     atorvastatin (LIPITOR) 40 MG tablet Take 1 tablet (40 mg total) by mouth daily at 6 PM. 90 tablet 3   Budeson-Glycopyrrol-Formoterol (BREZTRI AEROSPHERE) 160-9-4.8 MCG/ACT AERO INHALE 2 PUFFS BY MOUTH TWICE DAILY MORNING AND BEDTIME. 10.7 g 5   buPROPion (WELLBUTRIN XL) 150 MG 24 hr tablet Take 150 mg by mouth daily.  0   busPIRone (BUSPAR) 5 MG tablet Take 5 mg by mouth 2 (two) times daily as needed.     cyclobenzaprine (FLEXERIL) 5 MG tablet Take 5 mg by mouth 2 (two) times daily.     D3-50 50000 units capsule Take 50,000 Units by mouth once a week.  1   desvenlafaxine (PRISTIQ) 100 MG 24 hr tablet Take 100 mg by mouth daily.     EPIPEN 2-PAK 0.3 MG/0.3ML SOAJ injection Inject into the muscle.     fluticasone (FLONASE) 50 MCG/ACT nasal spray INHALE 1 SPRAYS INTO EACH NOSTRIL ONCE DAILY. 16 g 5   gabapentin (NEURONTIN) 300 MG capsule Take 300 mg by mouth 2 (two) times daily.  5   ipratropium-albuterol (DUONEB) 0.5-2.5 (3) MG/3ML SOLN Take by nebulization.     levocetirizine (XYZAL) 5 MG tablet TAKE 1 TABLET BY MOUTH EVERY EVENING 30 tablet 11   lisinopril (PRINIVIL,ZESTRIL) 10 MG tablet Take 1 tablet (10 mg total) by mouth daily. 90 tablet 3   metFORMIN (GLUCOPHAGE) 1000 MG tablet Take 1 tablet by mouth 2 (two) times daily.     metoprolol succinate (TOPROL XL) 25 MG 24 hr tablet Take 1 tablet (25 mg total) by mouth daily. 90 tablet 3   MOUNJARO 5 MG/0.5ML Pen Inject into the skin.     Multiple Vitamins-Minerals (PRESERVISION AREDS 2 PO) Take by mouth 2 (two) times daily.     Nebulizers (INNOSPIRE ESSENCE NEBULIZER) MISC       Olopatadine HCl 0.2 % SOLN APPLY 1 DROP TO EYE DAILY AS NEEDED. 2.5 mL 5   oxybutynin (DITROPAN) 5 MG tablet Take 1 tablet (5 mg total) by mouth every 8 (eight) hours as needed for bladder spasms. And stent discomfort. 90 tablet 1   pantoprazole (PROTONIX) 40 MG tablet Take 40 mg by mouth daily.     phenytoin (DILANTIN) 200 MG ER capsule Take 1 capsule (200  mg total) by mouth 2 (two) times daily. 60 capsule 0   triamcinolone cream (KENALOG) 0.1 % Apply 1 application. topically 2 (two) times daily. 454 g 1   VYVANSE 70 MG capsule Take 70 mg by mouth at bedtime.     Current Facility-Administered Medications  Medication Dose Route Frequency Provider Last Rate Last Admin   dupilumab (DUPIXENT) prefilled syringe 300 mg  300 mg Subcutaneous Q14 Days Valentina Shaggy, MD   300 mg at 04/14/22 1334    REVIEW OF SYSTEMS:   Constitutional: Denies fevers, chills or abnormal night sweats Eyes: Denies blurriness of vision, double vision or watery eyes Ears, nose, mouth, throat, and face: Denies mucositis or sore throat Respiratory: Denies cough, dyspnea or wheezes Cardiovascular: Denies palpitation, chest discomfort or lower extremity swelling Gastrointestinal:  Denies nausea, heartburn or change in bowel habits Skin: Denies abnormal skin rashes Lymphatics: Denies new lymphadenopathy or easy bruising Neurological:Denies numbness, tingling or new weaknesses.  Positive for dizziness and headaches. Behavioral/Psych: Mood is stable, no new changes  All other systems were reviewed with the patient and are negative.  PHYSICAL EXAMINATION: ECOG PERFORMANCE STATUS: 1 - Symptomatic but completely ambulatory  Vitals:   07/11/22 1208  BP: 133/78  Pulse: 91  Resp: 18  Temp: (!) 97.5 F (36.4 C)  SpO2: 98%   There were no vitals filed for this visit.  GENERAL:alert, no distress and comfortable SKIN: skin color, texture, turgor are normal, no rashes or significant lesions EYES: normal, conjunctiva  are pink and non-injected, sclera clear OROPHARYNX:no exudate, no erythema and lips, buccal mucosa, and tongue normal  NECK: supple, thyroid normal size, non-tender, without nodularity LYMPH:  no palpable lymphadenopathy in the cervical, axillary or inguinal LUNGS: clear to auscultation and percussion with normal breathing effort HEART: regular rate & rhythm and no murmurs and no lower extremity edema ABDOMEN:abdomen soft, non-tender and normal bowel sounds Musculoskeletal:no cyanosis of digits and no clubbing  PSYCH: alert & oriented x 3 with fluent speech NEURO: no focal motor/sensory deficits  LABORATORY DATA:  I have reviewed the data as listed No results found for this or any previous visit (from the past 2160 hour(s)).  RADIOGRAPHIC STUDIES: I have personally reviewed the radiological images as listed and agreed with the findings in the report. No results found.  ASSESSMENT:  1.  Leukocytosis: - Patient seen at the request of Dr. Nevada Crane for elevated white count. - CBC (01/11/2021): WBC 15.2, ANC 10.7.  Hemoglobin and platelets normal. - Elevated white count since December 2017.  Predominantly neutrophils and eosinophils. - No B symptoms or recurrent infections.  She reports tiredness all the time.  She has some itching on the palms and soles after taking shower.  No vasomotor symptoms.  No prior history of thrombosis. - She sees Dr. Ernst Bowler for asthma/COPD overlap syndrome, seasonal and perennial allergic rhinitis and contact dermatitis.  She is on [place] daily.  Oral steroids no more than once a year. - CT CAP (01/11/2021): Hepatomegaly, spleen normal, no adenopathy.  2.  Social/family history: - Lives with a roommate.  She is currently on disability.  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  No exposure to chemicals.  She smokes cigarettes only when she drinks a lot. - Father had small cell carcinoma arising in the sternum.  7 of paternal  uncles and aunts had cancer, 2 of them had brain tumors and one pancreatic cancer.  PLAN:  1.  Neutrophilic leukocytosis: - We discussed  etiologies of neutrophilic leukocytosis including myeloproliferative neoplasms. - I suspect the steroid inhalers and occasional systemic steroids are contributing to her white count. - However we will rule out myeloproliferative disorders by checking [id number] with reflex testing and BCR/ABL by FISH.  RTC 3 weeks to discuss results.    All questions were answered. The patient knows to call the clinic with any problems, questions or concerns.      Doreatha Massed, MD 07/11/22 1:23 PM

## 2022-07-16 LAB — BCR-ABL1 FISH
Cells Analyzed: 200
Cells Counted: 200

## 2022-07-18 LAB — JAK2 V617F RFX CALR/MPL/E12-15

## 2022-07-18 LAB — CALR +MPL + E12-E15  (REFLEX)

## 2022-07-24 NOTE — Progress Notes (Unsigned)
CONSULT NOTE  Patient Care Team: Celene Squibb, MD as PCP - General (Internal Medicine) Harl Bowie Alphonse Guild, MD as PCP - Cardiology (Cardiology) Derek Jack, MD as Medical Oncologist (Hematology)  CHIEF COMPLAINTS/PURPOSE OF CONSULTATION:  Leukocytosis.  Interval history/current status -Savannah Pena is seen today in follow-up.  At her repeat CBC showed a normal WBC of 8.5.  At today's office visit we did review an extensive work-up.  Jak 2 was negative, BCR-ABL 1 FISH was negative, LDH was normal, CALR plus MPL plus the 12th were all negative.  Given that her leukocytosis is fully resolved and she has no B symptoms she would prefer to be followed by her PCP.  I think this is completely reasonable.  We are happy to see her back at any time should her leukocytosis return and persist.  She was encouraged to quit smoking for good  HISTORY OF PRESENTING ILLNESS:  Savannah Pena 61 y.o. female is seen in consultation today at the request of Dr. Nevada Crane for elevated white count.  Her recent white count 3 weeks ago was reportedly elevated at 16,000.  She reports that she had a history of leukocytosis for the last 10 years.  It was predominantly increase in neutrophils and eosinophils.  She reports that she feels tired all the time.  No specific B symptoms.  She has chronic rash on the left arm which is stable.  She has been on Breztri inhaler for the last 1 year.  Last systemic steroid was around a year ago.  She denies any recurrent infections.  She smokes cigarettes only when she drinks a lot.    MEDICAL HISTORY:  Past Medical History:  Diagnosis Date   Anxiety    Arthritis    Asthma    Attention deficit disorder (ADD)    COPD (chronic obstructive pulmonary disease) (HCC)    Diabetes mellitus without complication (HCC)    Dyspnea    with exertion   GERD (gastroesophageal reflux disease)    History of kidney stones    Hypertension    Kidney stone    Major depression    Osteoporosis     Seizures ([place])    last seizure 23 years ago   Sleep apnea    has C-PAP, but does not currently use   Takotsubo cardiomyopathy    a. EF 35-40% by echo in 03/2018 with cath showing mild, nonobstructive CAD.     SURGICAL HISTORY: Past Surgical History:  Procedure Laterality Date   CESAREAN SECTION     CYSTOSCOPY WITH RETROGRADE PYELOGRAM, URETEROSCOPY AND STENT PLACEMENT Right 12/05/2016   Procedure: CYSTOSCOPY WITH RETROGRADE PYELOGRAM, URETEROSCOPY AND STENT PLACEMENT;  Surgeon: Alexis Frock, MD;  Location: [facility];  Service: Urology;  Laterality: Right;   CYSTOSCOPY WITH STENT PLACEMENT Right 11/06/2016   Procedure: CYSTOSCOPY WITH STENT PLACEMENT;  Surgeon: Irine Seal, MD;  Location: [facility];  Service: Urology;  Laterality: Right;   FLEXOR TENDON REPAIR Right    foot   KIDNEY STONE SURGERY     LEFT HEART CATH AND CORONARY ANGIOGRAPHY N/A 03/17/2018   Procedure: LEFT HEART CATH AND CORONARY ANGIOGRAPHY;  Surgeon: Burnell Blanks, MD;  Location: [place] [facility];  Service: Cardiovascular;  Laterality: N/A;    SOCIAL HISTORY: Social History   Socioeconomic History   Marital status: Single    Spouse name: Not on file   Number of children: Not on file   Years of education: Not on file   Highest education level: Not on  file  Occupational History   Not on file  Tobacco Use   Smoking status: Former    Types: Cigarettes    Quit date: 11/12/2016    Years since quitting: 5.6   Smokeless tobacco: Former   Tobacco comments:    04-08-2017 per pt stopped in Jan 2018  Vaping Use   Vaping Use: Never used  Substance and Sexual Activity   Alcohol use: Yes    Comment: occassional, 04-08-2017 per pt occas.   Drug use: No    Comment: 04-08-2017 per pt no    Sexual activity: Yes    Birth control/protection: Post-menopausal  Other Topics Concern   Not on file  Social History Narrative   Not on file   Social Determinants of Health   Financial Resource Strain: Not on file   Food Insecurity: Not on file  Transportation Needs: Not on file  Physical Activity: Not on file  Stress: Not on file  Social Connections: Not on file  Intimate Partner Violence: Not on file    FAMILY HISTORY: Family History  Problem Relation Age of Onset   Prostate cancer Father    Diabetes Father     ALLERGIES:  has no active allergies.  MEDICATIONS:  Current Outpatient Medications  Medication Sig Dispense Refill   montelukast (SINGULAIR) 10 MG tablet TAKE 1 TABLET BY MOUTH ONCE DAILY. 30 tablet 4   acetaminophen (TYLENOL) 500 MG tablet Take 500 mg by mouth every 6 (six) hours as needed.     albuterol (VENTOLIN HFA) 108 (90 Base) MCG/ACT inhaler Inhale 1-2 puffs into the lungs every 6 (six) hours as needed for wheezing or shortness of breath. 18 g 0   amphetamine-dextroamphetamine (ADDERALL) 15 MG tablet Take 1 tablet by mouth daily.     aspirin EC 81 MG EC tablet Take 1 tablet (81 mg total) by mouth daily.     atorvastatin (LIPITOR) 40 MG tablet Take 1 tablet (40 mg total) by mouth daily at 6 PM. 90 tablet 3   Budeson-Glycopyrrol-Formoterol (BREZTRI AEROSPHERE) 160-9-4.8 MCG/ACT AERO INHALE 2 PUFFS BY MOUTH TWICE DAILY MORNING AND BEDTIME. 10.7 g 5   buPROPion (WELLBUTRIN XL) 150 MG 24 hr tablet Take 150 mg by mouth daily.  0   busPIRone (BUSPAR) 5 MG tablet Take 5 mg by mouth 2 (two) times daily as needed.     cyclobenzaprine (FLEXERIL) 5 MG tablet Take 5 mg by mouth 2 (two) times daily.     D3-50 50000 units capsule Take 50,000 Units by mouth once a week.  1   desvenlafaxine (PRISTIQ) 100 MG 24 hr tablet Take 100 mg by mouth daily.     EPIPEN 2-PAK 0.3 MG/0.3ML SOAJ injection Inject into the muscle.     fluticasone (FLONASE) 50 MCG/ACT nasal spray INHALE 1 SPRAYS INTO EACH NOSTRIL ONCE DAILY. 16 g 5   gabapentin (NEURONTIN) 300 MG capsule Take 300 mg by mouth 2 (two) times daily.  5   ipratropium-albuterol (DUONEB) 0.5-2.5 (3) MG/3ML SOLN Take by nebulization.      levocetirizine (XYZAL) 5 MG tablet TAKE 1 TABLET BY MOUTH EVERY EVENING 30 tablet 11   lisinopril (PRINIVIL,ZESTRIL) 10 MG tablet Take 1 tablet (10 mg total) by mouth daily. 90 tablet 3   metFORMIN (GLUCOPHAGE) 1000 MG tablet Take 1 tablet by mouth 2 (two) times daily.     metoprolol succinate (TOPROL XL) 25 MG 24 hr tablet Take 1 tablet (25 mg total) by mouth daily. 90 tablet 3   MOUNJARO 5  MG/0.5ML Pen Inject into the skin.     Multiple Vitamins-Minerals (PRESERVISION AREDS 2 PO) Take by mouth 2 (two) times daily.     Nebulizers (INNOSPIRE ESSENCE NEBULIZER) MISC      Olopatadine HCl 0.2 % SOLN APPLY 1 DROP TO EYE DAILY AS NEEDED. 2.5 mL 5   oxybutynin (DITROPAN) 5 MG tablet Take 1 tablet (5 mg total) by mouth every 8 (eight) hours as needed for bladder spasms. And stent discomfort. 90 tablet 1   pantoprazole (PROTONIX) 40 MG tablet Take 40 mg by mouth daily.     phenytoin (DILANTIN) 200 MG ER capsule Take 1 capsule (200 mg total) by mouth 2 (two) times daily. 60 capsule 0   triamcinolone cream (KENALOG) 0.1 % Apply 1 application. topically 2 (two) times daily. 454 g 1   VYVANSE 70 MG capsule Take 70 mg by mouth at bedtime.     Current Facility-Administered Medications  Medication Dose Route Frequency Provider Last Rate Last Admin   dupilumab (DUPIXENT) prefilled syringe 300 mg  300 mg Subcutaneous Q14 Days Alfonse Spruce, MD   300 mg at 04/14/22 1334    REVIEW OF SYSTEMS:   Constitutional: Denies fevers, chills or abnormal night sweats Eyes: Denies blurriness of vision, double vision or watery eyes Ears, nose, mouth, throat, and face: Denies mucositis or sore throat Respiratory: Denies cough, dyspnea or wheezes Cardiovascular: Denies palpitation, chest discomfort or lower extremity swelling Gastrointestinal:  Denies nausea, heartburn or change in bowel habits Skin: Denies abnormal skin rashes Lymphatics: Denies new lymphadenopathy or easy bruising Neurological:Denies numbness,  tingling or new weaknesses.  Positive for dizziness and headaches. Behavioral/Psych: Mood is stable, no new changes  All other systems were reviewed with the patient and are negative.  PHYSICAL EXAMINATION: ECOG PERFORMANCE STATUS: 1 - Symptomatic but completely ambulatory  There were no vitals filed for this visit.  There were no vitals filed for this visit.  GENERAL:alert, no distress and comfortable SKIN: skin color, texture, turgor are normal, no rashes or significant lesions EYES: normal, conjunctiva are pink and non-injected, sclera clear OROPHARYNX:no exudate, no erythema and lips, buccal mucosa, and tongue normal  LUNGS: clear to auscultation and percussion with normal breathing effort HEART: regular rate & rhythm and no murmurs and no lower extremity edema  Musculoskeletal:no cyanosis of digits and no clubbing  PSYCH: alert & oriented x 3 with fluent speech NEURO: no focal motor/sensory deficits  LABORATORY DATA:  I have reviewed the data as listed Recent Results (from the past 2160 hour(s))  BCR-ABL1 FISH     Status: None   Collection Time: 07/11/22  1:10 PM  Result Value Ref Range   Specimen Type BLOOD    Cells Counted 200    Cells Analyzed 200    FISH Result Comment:     Comment: NORMAL:  NO BCR OR ABL1 GENE REARRANGEMENT OBSERVED   Interpretation Comment:     Comment: (NOTE)             nuc ish 9q34(ASS1,ABL1)x2,22q11.2(BCRx2)[200].      The fluorescence in situ hybridization (FISH) study was normal. FISH, using unique sequence DNA probes for the ABL1 and BCR gene regions showed two ABL1 signals (red), two control ASS1 gene signals (aqua) located adjacent to the ABL1 locus at 9q34, and two BCR signals (green) at 22q11.2 in all interphase nuclei examined. There was NO evidence of CML or ALL-associated BCR/ABL1 dual fusion signals in this analysis. .      This analysis is limited  to abnormalities detectable by the specific probes included in the study. FISH  results should be interpreted within the context of a full cytogenetic analysis and pathology evaluation.  A BCR-ABL1 gene fusion in greater than 3 interphase nuclei in a patient with a new clinical diagnosis is considered positive. The DNA probe vendor for this study was [employer], research (physical sciences)). .      This test was developed and its performance characteristics determined by  [facility]). It has not been cleared or approved by the U.S. Food and Drug Administration.    Director Review: Comment:     Comment: (NOTE) Laurie Panda, PHD Performed At: [facility] [address] [place], [place] [id number] Katina Degree MDPhD [id number]   JAK2 V617F rfx CALR/MPL/E12-15     Status: None   Collection Time: 07/11/22  1:10 PM  Result Value Ref Range   JAK2 V617F Result Comment     Comment: (NOTE) NEGATIVE The JAK2 V617F mutation is not detected in the provided specimen of this individual. Results should be interpreted in conjunction with clinical and other laboratory findings for the most accurate interpretation. This test was developed and its performance characteristics determined by [facility]. It has not been cleared or approved by the Food and Drug Administration.    Reflex Comment     Comment: (NOTE) Reflex to CALR Mutation Analysis, JAK2 Exon 12-15 Mutation Analysis, and MPL Mutation Analysis is indicated.    V617F Rfx CALR/MPL/E12-15 Bkgd Comment     Comment: (NOTE) Molecular testing of blood or bone marrow is useful in the evaluation of suspected myeloproliferative neoplasms (MPN). Mutations in the JAK2, MPL, and CALR genes are present in virtually all MPNs and their presence help distinguish benign reactive processes from clonal neoplasms. These mutations are generally considered mutually exclusive, although concurrent clones have been reported in rare patients. This test will assess for the JAK2V617F (exon 14) mutation  first and will reflex to CALR mutation analysis, MPL mutation analysis, and JAK2 exon 12 to 15 mutation analysis if the JAK2V617F mutation is negative. The JAK2 (Janus kinase 2) gene encodes for a non-receptor protein tyrosine kinase that activates cytokine and growth factor signaling. The V617F (c.1849 G>T) mutation results in constitutive activation of JAK2 and downstream STAT5 and ERK signaling. The V617F mutation is observed in approximately 95% of polycythemia vera (PV), 60% of essential thrombocythemi a (ET) and primary myelofibrosis (PMF). It is also infrequently present (3-5%) in myelodysplastic syndrome, chronic myelomonocytic leukemia, and other atypical chronic myeloid disorders. A small percentage of JAK2 mutation positive patients (3.3%) contain other non-V617F mutations within exons 12 to 15. In particular, mutations in exon 12 of JAK2 have been described in approximately 3% of patients with PV. JAK2 allele burden correlates with clinical phenotype, with low levels of mutant allele characterized by thrombocytosis, intermediate levels with erythrocytosis, and high mutant allele burden correlating with enhanced myelopoiesis of the BM, leukocytosis, increasing spleen size, and circulating CD34-positive cells. The CALR (Calreticulin) gene encodes for a multifunctional calcium-binding protein involved in many cellular activities such as growth, proliferation, adhesion, and programmed cell death. Among patients with JAK2 negative MPNs, CALR are found in approximat ely 70% of patients with JAK2-negative essential thrombocythemia (ET) and 60-88% of patients with JAK2-negative primary myelofibrosis(PMF). Only a minority of patients (approximately 8%) with myelodysplasia have mutations in the CALR gene. CALR mutations are rarely detected in patients with de novo acute myeloid leukemia, chronic myelogenous leukemia, lymphoid leukemia, or solid tumors.  CALR mutations are not detected  in polycythemia and generally appear to be mutually exclusive with JAK2 mutations and MPL mutations. The majority of mutational changes involve a variety of insertion deletion mutations in exon 9 of the calreticulin gene: approximately 53% of all CALR mutations are a 52 bp deletion (type-1) while the second most prevalent mutation (approximately 32%) contains a 5 bp insertion (type-2). Other mutations (non-type 1 or type 2) are seen in a small minority of cases. CALR mutations in PMF tend to be with a favorable prognosis compared to JAK2 V617F mutations, w hereas primary myelofibrosis negative for CALR, JAK2 V617F and MPL mutations (so-called triple negative) is associated with a poor prognosis and shorter survival. The MPL (myeloproliferative leukemia virus oncogene) gene encodes the thrombopoietin receptor which regulates hematopoiesis and megakaryopoiesis. Activating MPL mutations are associated with a subset of myeloproliferative neoplasms and acute megakaryoblastic leukemia. MPL W515 mutations are present in approximately 5-8% of patients with primary myelofibrosis (PMF) and 1-4% of patients with essential thrombocythemia (ET). The S505 mutation is detected in patients with hereditary thrombocythemia. Limitations This assay has a sensitivity of approximately 1% VAF for JAK2 V617F, 2.5% VAF for other mutations in JAK2 exons 12 to 15, CALR mutations, and MPL mutations.    Method based next generation sequencing.     Comment: Comment [employer]    References Comment     Comment: (NOTE) Alghasham N, Alnouri Y, Abalkhail H, Clarita Leber. Detection of mutations in JAK2 exons 12-15 by [employer] sequencing. Int J Lab Hematol. 2016 Feb;38(1):34-41. doi: 10.1111/ijlh.23762. Epub 2015 Sep 11. PMID: 83151761. Pura Spice, Unk Lightning, Hasserjian R, Patrica Duel, Borowitz MJ, Leroy Libman MM, McDonough CD, Pisinemo, Vardiman JW. The 2016 revision to the World Health Organization classification of myeloid  neoplasms and acute leukemia. Blood. 2016 May 19;127(20):2391-405. doi: 10.1182/blood-2016-03-643544. Epub 2016 Apr 11. PMID: 60737106. [facility] Zhang ZJ, [place] S, Albitar M. Mutation profile of JAK2 transcripts in patients with chronic myeloproliferative neoplasias. J Mol Diagn. 2009 Jan;11(1):49-53.doi: 10.2353/jmoldx.2009.080114. Epub 2008 Dec 12. PMID: 26948546; PMCID: EVO3500938. NCCN Clinical Practice Guidelines in Oncology (NCCN Guidelines) Myeloproliferative Neoplasms Version 3.2022 - June 20, 2021. Swerdlow SH, Programmer, multimedia. WHO classif ication of Tumours of Haematopoietic and Lymphoid Tissues. 4th edn. Jaci Standard, [place]: Geologist, engineering for [employer] on [employer]; 2017. Tefferi A. Primary myelofibrosis: 2021 update on diagnosis, risk-stratification and management. Am J Hematol. 2021 Jan;96(1):145-162. doi: 10.1002/ajh.26050. Epub 2020 Dec 2. PMID: 18299371. Royetta Car, Kralovics R. Genetic basis and molecular pathophysiology of classical myeloproliferative neoplasms. Blood. 2017 Feb 9;129(6):667-679. doi: 10.1182/blood-2016-10-695940. Epub 2016 Dec 27. PMID: 69678938.    Director Review Comment     Comment: (NOTE) Les Pou, PhD, [facility] Director, Molecular Oncology [facility] for Molecular Biology and Pathology [address] [place], [place] [address] [phone] Performed At: [facility] [address] [place], [place] [id number] Maurine Simmering MDPhD [id number] Performed At: [facility] [address] [place], [place] [id number] Maurine Simmering MDPhD [id number]   Lactate dehydrogenase     Status: None   Collection Time: 07/11/22  1:10 PM  Result Value Ref Range   LDH 116 98 - 192 U/L    Comment: Performed at [facility], [address], [place], [place] [address]  CBC with Differential     Status: None   Collection Time: 07/11/22  1:10 PM  Result Value Ref Range   WBC 8.5 4.0 - 10.5 K/uL   RBC 4.56 3.87 - 5.11 MIL/uL  Hemoglobin 14.2 12.0 - 15.0 g/dL   HCT 41.8 36.0 - 46.0 %   MCV 91.7 80.0 - 100.0 fL   MCH 31.1 26.0 - 34.0 pg   MCHC 34.0 30.0 - 36.0 g/dL   RDW 12.2 11.5 - 15.5 %   Platelets 339 150 - 400 K/uL   nRBC 0.0 0.0 - 0.2 %   Neutrophils Relative % 57 %   Neutro Abs 4.9 1.7 - 7.7 K/uL   Lymphocytes Relative 32 %   Lymphs Abs 2.7 0.7 - 4.0 K/uL   Monocytes Relative 7 %   Monocytes Absolute 0.6 0.1 - 1.0 K/uL   Eosinophils Relative 3 %   Eosinophils Absolute 0.3 0.0 - 0.5 K/uL   Basophils Relative 1 %   Basophils Absolute 0.1 0.0 - 0.1 K/uL   Immature Granulocytes 0 %   Abs Immature Granulocytes 0.02 0.00 - 0.07 K/uL    Comment: Performed at [facility], [address], [place], [place] [address]  CALR +MPL + E12-E15 (reflexed)     Status: None   Collection Time: 07/11/22  1:10 PM  Result Value Ref Range   CALR Result Comment     Comment: (NOTE) NEGATIVE No insertions or deletions were detected within the analyzed region of the calreticulin (CALR) gene. A negative result does not entirely exclude the possibility of a clonal population carrying CALR gene mutations that are not covered by this assay. Results should be interpreted in conjunction with clinical and laboratory findings for the most accurate interpretation.    MPL Result Comment     Comment: (NOTE) NEGATIVE No MPL mutation was identified in the provided specimen of this individual. Results should be interpreted in conjunction with clinical and other laboratory findings for the most accurate interpretation.    E12-15 Result Comment     Comment: (NOTE) NEGATIVE JAK2 mutations were not detected in exons 12, 13, 14 and 15. This result does not rule out the presence of JAK2 mutation at a level below the detection sensitivity of this assay, the presence of other mutations outside the analyzed region of the JAK2 gene, or the presence of a myeloproliferative or other neoplasm. Result must be correlated with other  clinical data for the most accurate diagnosis. Performed At: [facility] [address] [place], [place] [id number] Katina Degree MDPhD [id number]     RADIOGRAPHIC STUDIES: I have personally reviewed the radiological images as listed and agreed with the findings in the report. No results found.  ASSESSMENT:  1.  Leukocytosis: - Patient seen at the request of Dr. Nevada Crane for elevated white count. - CBC (01/11/2021): WBC 15.2, ANC 10.7.  Hemoglobin and platelets normal. - Elevated white count since December 2017.  Predominantly neutrophils and eosinophils. - No B symptoms or recurrent infections.  She reports tiredness all the time.  She has some itching on the palms and soles after taking shower.  No vasomotor symptoms.  No prior history of thrombosis. - She sees Dr. Ernst Bowler for asthma/COPD overlap syndrome, seasonal and perennial allergic rhinitis and contact dermatitis.  She is on [place] daily.  Oral steroids no more than once a year. - CT CAP (01/11/2021): Hepatomegaly, spleen normal, no adenopathy.  2.  Social/family history: - Lives with a roommate.  She is currently on disability.  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  No exposure to chemicals.  She smokes cigarettes only when she drinks a lot. - Father had small cell carcinoma  arising in the sternum.  7 of paternal uncles and aunts had cancer, 2 of them had brain tumors and one pancreatic cancer.  PLAN:  1.  Neutrophilic leukocytosis: - We discussed etiologies of neutrophilic leukocytosis including myeloproliferative neoplasms. - I suspect the steroid inhalers and occasional systemic steroids are contributing to her white count.   Extensive lab evaluation shows no evidence of myeloproliferative disorder.  Her leukocytosis has spontaneously resolved with a normal WBC of 8.5.  She would prefer to be followed by her PCP with regular CBCs at least every 6 months.  We are happy to see her  back at any time should her leukocytosis return and persist.  All questions were answered. The patient knows to call the clinic with any problems, questions or concerns.      Arnell Asal, NP, AOCNP 07/24/22 3:21 PM

## 2022-07-29 ENCOUNTER — Other Ambulatory Visit: Payer: Medicaid Other

## 2022-07-29 ENCOUNTER — Inpatient Hospital Stay (HOSPITAL_BASED_OUTPATIENT_CLINIC_OR_DEPARTMENT_OTHER): Payer: Medicaid Other | Admitting: Nurse Practitioner

## 2022-07-29 VITALS — BP 101/67 | HR 101 | Temp 97.7°F | Resp 17

## 2022-07-29 DIAGNOSIS — D72829 Elevated white blood cell count, unspecified: Secondary | ICD-10-CM | POA: Diagnosis not present

## 2022-07-29 DIAGNOSIS — D72828 Other elevated white blood cell count: Secondary | ICD-10-CM | POA: Diagnosis not present

## 2022-07-29 DIAGNOSIS — Z862 Personal history of diseases of the blood and blood-forming organs and certain disorders involving the immune mechanism: Secondary | ICD-10-CM

## 2022-08-06 ENCOUNTER — Other Ambulatory Visit (HOSPITAL_COMMUNITY): Payer: Self-pay

## 2022-08-06 ENCOUNTER — Telehealth: Payer: Self-pay | Admitting: *Deleted

## 2022-08-06 MED ORDER — DUPIXENT 300 MG/2ML ~~LOC~~ SOSY
300.0000 mg | PREFILLED_SYRINGE | SUBCUTANEOUS | 11 refills | Status: DC
  Filled 2022-08-06 – 2022-08-15 (×2): qty 4, 28d supply, fill #0
  Filled 2022-09-26: qty 4, 28d supply, fill #1
  Filled 2022-10-31 (×2): qty 4, 28d supply, fill #2
  Filled 2022-12-15: qty 4, 28d supply, fill #3
  Filled 2023-01-21: qty 4, 28d supply, fill #4
  Filled 2023-03-02: qty 4, 28d supply, fill #5

## 2022-08-06 NOTE — Telephone Encounter (Signed)
Patient advised of change for Rx Dupixent from [facility] to [facility] long pharmacy due to [facility] no longer dispensing biologics

## 2022-08-13 ENCOUNTER — Other Ambulatory Visit (HOSPITAL_COMMUNITY): Payer: Self-pay

## 2022-08-15 ENCOUNTER — Other Ambulatory Visit (HOSPITAL_COMMUNITY): Payer: Self-pay

## 2022-08-25 ENCOUNTER — Other Ambulatory Visit (HOSPITAL_COMMUNITY): Payer: Self-pay

## 2022-08-27 ENCOUNTER — Other Ambulatory Visit (HOSPITAL_COMMUNITY): Payer: Self-pay

## 2022-08-29 ENCOUNTER — Ambulatory Visit: Payer: Medicaid Other | Admitting: Allergy & Immunology

## 2022-09-03 ENCOUNTER — Ambulatory Visit: Payer: Medicaid Other | Admitting: Allergy & Immunology

## 2022-09-03 ENCOUNTER — Encounter: Payer: Self-pay | Admitting: Allergy & Immunology

## 2022-09-03 VITALS — BP 130/82 | HR 98 | Temp 97.7°F | Resp 18 | Ht 64.5 in | Wt 205.0 lb

## 2022-09-03 DIAGNOSIS — J3089 Other allergic rhinitis: Secondary | ICD-10-CM

## 2022-09-03 DIAGNOSIS — J4550 Severe persistent asthma, uncomplicated: Secondary | ICD-10-CM | POA: Diagnosis not present

## 2022-09-03 DIAGNOSIS — J4489 Other specified chronic obstructive pulmonary disease: Secondary | ICD-10-CM | POA: Diagnosis not present

## 2022-09-03 DIAGNOSIS — K047 Periapical abscess without sinus: Secondary | ICD-10-CM

## 2022-09-03 DIAGNOSIS — R635 Abnormal weight gain: Secondary | ICD-10-CM | POA: Diagnosis not present

## 2022-09-03 DIAGNOSIS — J302 Other seasonal allergic rhinitis: Secondary | ICD-10-CM

## 2022-09-03 MED ORDER — LEVOCETIRIZINE DIHYDROCHLORIDE 5 MG PO TABS: 5.0000 mg | ORAL_TABLET | Freq: Every evening | ORAL | 1 refills | Status: DC

## 2022-09-03 MED ORDER — AMOXICILLIN-POT CLAVULANATE 875-125 MG PO TABS: 1.0000 | ORAL_TABLET | Freq: Two times a day (BID) | ORAL | 0 refills | Status: AC

## 2022-09-03 MED ORDER — MONTELUKAST SODIUM 10 MG PO TABS: 10.0000 mg | ORAL_TABLET | Freq: Every day | ORAL | 1 refills | Status: AC

## 2022-09-03 NOTE — Patient Instructions (Addendum)
1. Asthma-COPD overlap syndrome - Lung testing looked amazing today. - I think we are headed in the right direction. - Continue with Dupixent every two weeks.  - Daily controller medication(s): Breztri 2 puffs twice daily with spacer + Singulair (montelukast) 10 mg daily + Dupixent 300mg  every two weeks  - Prior to physical activity: albuterol 2 puffs 10-15 minutes before physical activity. - Rescue medications: albuterol 4 puffs every 4-6 hours as needed or albuterol nebulizer one vial every 4-6 hours as needed - Asthma control goals:  * Full participation in all desired activities (may need albuterol before activity) * Albuterol use two time or less a week on average (not counting use with activity) * Cough interfering with sleep two time or less a month * Oral steroids no more than once a year * No hospitalizations  2. Seasonal and perennial allergic rhinitis (grasses, ragweed, weeds, trees, indoor molds, outdoor molds, dust mites, cat and dog) - Continue with: Singulair (montelukast) 10 mg daily - Continue with: Xyzal (levocetirizine) 5mg  tablet once daily and Flonase (fluticasone) one spray per nostril daily - You can use an extra dose of the antihistamine, if needed, for breakthrough symptoms.  - Consider nasal saline rinses 1-2 times daily to remove allergens from the nasal cavities as well as help with mucous clearance (this is especially helpful to do before the nasal sprays are given) - Consider allergy shots as a means of long-term control (we can make them stronger which might be more helpful than your previous round of allergy shots).   3. Contact dermatitis - Continue with the triamcinolone 0.1% ointment applied twice daily as needed. - Hopefully the Dupixent every two weeks.   4. Weight gain  - We will send you in to see Endocrinology.  - Hopefully they will reach out to make an appointment.   5. Tooth abscess - Start Augmentin twice daily for 14 days. - Hopefully this  will help and tide you over until your dentist appointment in February.   6. Return in about 6 months (around 03/05/2023).    Please inform us of any Emergency Department visits, hospitalizations, or changes in symptoms. Call us before going to the ED for breathing or allergy symptoms since we might be able to fit you in for a sick visit. Feel free to contact us anytime with any questions, problems, or concerns.  It was a pleasure to see you again today!  Websites that have reliable patient information: 1. American Academy of Asthma, Allergy, and Immunology: www.aaaai.org 2. Food Allergy Research and Education (FARE): foodallergy.org 3. Mothers of Asthmatics: http://www.asthmacommunitynetwork.org 4. American College of Allergy, Asthma, and Immunology: www.acaai.org   COVID-19 Vaccine Information can be found at: [email or web address].[email or web address].uk For questions related to vaccine distribution or appointments, please email [email or web address]@ [email or web address] or call [phone].   We realize that you might be concerned about having an allergic reaction to the COVID19 vaccines. To help with that concern, WE ARE OFFERING THE COVID19 VACCINES IN OUR OFFICE! Ask the front desk for dates!     "Like" [place] on Facebook and Instagram for our latest updates!      A healthy democracy works best when [employer] participate! Make sure you are registered to vote! If you have moved or changed any of your contact information, you will need to get this updated before voting!  In some cases, you MAY be able to register to vote online: [email or web address]     Three finger technique: Using a spacer is the BEST for medication  delivery. But if you do not have a spacer, you can use 3 fingers to make space between the end of the inhaler where the medication comes out and your mouth. Open you mouth and then start inhaling. Press the inhaler and keep  breathing in for a few more seconds. Then hold your breath for 5 seconds. Repeat for the prescribed number of inhalations.

## 2022-09-03 NOTE — Progress Notes (Signed)
FOLLOW UP  Date of Service/Encounter:  09/03/22   Assessment:   Asthma-COPD overlap syndrome - poorly controlled   Seasonal and perennial allergic rhinitis (grasses, ragweed, weeds, trees, indoor molds, outdoor molds, dust mites, cat and dog) - would benefit from allergen immunotherapy (did 15+ years without improvement decades ago)   Contact dermatitis - consider patch testing in the future   Elevated absolute eosinophil count (1000 in March 2022)   Disabled status  Intermittent leukocytosis - cleared by Hematology/Oncology  Tooth abscess - starting Augmentin today    Plan/Recommendations:   1. Asthma-COPD overlap syndrome - Lung testing looked amazing today. - I think we are headed in the right direction. - Continue with Dupixent every two weeks.  - Daily controller medication(s): Breztri 2 puffs twice daily with spacer + Singulair (montelukast) 10 mg daily + Dupixent 300mg  every two weeks  - Prior to physical activity: albuterol 2 puffs 10-15 minutes before physical activity. - Rescue medications: albuterol 4 puffs every 4-6 hours as needed or albuterol nebulizer one vial every 4-6 hours as needed - Asthma control goals:  * Full participation in all desired activities (may need albuterol before activity) * Albuterol use two time or less a week on average (not counting use with activity) * Cough interfering with sleep two time or less a month * Oral steroids no more than once a year * No hospitalizations  2. Seasonal and perennial allergic rhinitis (grasses, ragweed, weeds, trees, indoor molds, outdoor molds, dust mites, cat and dog) - Continue with: Singulair (montelukast) 10 mg daily - Continue with: Xyzal (levocetirizine) 5mg  tablet once daily and Flonase (fluticasone) one spray per nostril daily - You can use an extra dose of the antihistamine, if needed, for breakthrough symptoms.  - Consider nasal saline rinses 1-2 times daily to remove allergens from the nasal  cavities as well as help with mucous clearance (this is especially helpful to do before the nasal sprays are given) - Consider allergy shots as a means of long-term control (we can make them stronger which might be more helpful than your previous round of allergy shots).   3. Contact dermatitis - Continue with the triamcinolone 0.1% ointment applied twice daily as needed. - Hopefully the Dupixent every two weeks.   4. Weight gain  - We will send you in to see Endocrinology.  - Hopefully they will reach out to make an appointment.   5. Tooth abscess - Start Augmentin twice daily for 14 days. - Hopefully this will help and tide you over until your dentist appointment in February.   6. Return in about 6 months (around 03/05/2023).     Subjective:   Jeriah Skufca is a 61 y.o. female presenting today for follow up of  Chief Complaint  Patient presents with   Asthma    It has been okay. Some wheeze,cough,sob.    Allergic Rhinitis     Takes xyzal twice a day.    Medication Refill    Patient would like to have things being sent as 3 month supply only.  [name] has a history of the following: Patient Active Problem List   Diagnosis Date Noted   Seasonal and perennial allergic rhinitis 06/07/2021   Seasonal allergic conjunctivitis 06/07/2021   Contact dermatitis 06/07/2021   Gastroesophageal reflux disease 06/07/2021   DDD (degenerative disc disease), cervical 07/19/2018   Neck pain 07/19/2018   Osteopenia 07/19/2018   Takotsubo cardiomyopathy    Pure hypercholesterolemia  Cardiomyopathy, unspecified (HCC) 03/17/2018   NSTEMI (non-ST elevated myocardial infarction) (HCC) 03/17/2018   NICM (nonischemic cardiomyopathy) (HCC) 03/17/2018   Seizure (HCC) 03/16/2018   Asthma-COPD overlap syndrome 03/16/2018   Elevated troponin    Diabetes mellitus without complication (HCC)    Attention deficit disorder (ADD)    Diabetes (HCC) 04/08/2017   Asthma 04/08/2017    Macular degeneration 04/08/2017   Chronic back pain 04/08/2017   MDD (major depressive disorder), recurrent episode, moderate (HCC) 04/08/2017   PTSD (post-traumatic stress disorder) 04/08/2017   Hyperoxaluria 03/29/2017   Ureteral stone 11/06/2016    History obtained from: chart review and patient.  [place] is a 61 y.o. female presenting for a follow up visit.  She was last seen in April 2023.  At that time, her lung testing looked amazing.  We recommended limiting her albuterol use since it seemed to be more of a habit than the need.  We continue with Breztri 2 puffs twice daily as well as Singulair and Dupixent every 2 weeks.  For her allergic rhinitis, we continue with Singulair as well as Xyzal and Flonase.  Talk about allergy shots for long-term control.  For her contact dermatitis, we continue with triamcinolone twice daily as needed.  Since the last visit, she has done well. She is still living in her same place and she has all of her pets. This is a problem since she cannot move anywhere with her pets. They are all ages - ages 31 and 61. She continues to struggle with her partner who has some issues with bipolar depression. She is having problems with getting anyone to understand the issues.   Asthma/Respiratory Symptom History: Stress has always been a problem with her stress. She is using her [name] which is covered by her insurance.   She is doing her Dupixent. She is doing this at home. This is going well without a problem. She does think that the Dupixent has been a life saver for her. She appears to be breathing well today and her asthma is certainly not affecting her ability to talk.   Allergic Rhinitis Symptom History: She was on allergy shots for around 18 years. This was when she lived in [place].  She never felt that they helped. She is not really interested in doing them today. She remains on the Singulair as well as the Xyzal and Flonase.   She had two deaths in the family.  This necessitated traveling from [place] and then down to [place]. She broke her tooth and now she is having issues with pain and whatnot. She does have a tooth abscess right now. She has an appointment with a dentist, but this is scheduled for February.  She is not on any antibiotics.  Her abscess is on the lower left side of her mouth.  She also is worried about some weight gain.  She is asked to be sent to endocrinology, but her PCP has not done so yet.  This is despite being on some medication that should help her lose weight apparently.  Her WBC randomly shoots up. She was tested for leukemia and this was negative.  She follows with oncology.  Otherwise, there have been no changes to her past medical history, surgical history, family history, or social history.    Review of Systems  Constitutional: Negative.  Negative for chills, fever, malaise/fatigue and weight loss.       Positive for weight gain.  HENT: Negative.  Negative for congestion, ear  discharge, ear pain and sinus pain.        Positive for tooth pain.  Eyes:  Negative for pain, discharge and redness.  Respiratory:  Negative for cough, sputum production, shortness of breath and wheezing.   Cardiovascular: Negative.  Negative for chest pain and palpitations.  Gastrointestinal:  Negative for abdominal pain, constipation, diarrhea, heartburn, nausea and vomiting.  Skin: Negative.  Negative for itching and rash.  Neurological:  Negative for dizziness and headaches.  Endo/Heme/Allergies:  Positive for environmental allergies. Does not bruise/bleed easily.  All other systems reviewed and are negative.      Objective:   Blood pressure 130/82, pulse 98, temperature 97.7 F (36.5 C), resp. rate 18, height 5' 4.5" (1.638 m), weight 205 lb (93 kg), last menstrual period 12/06/2011, SpO2 98 %. Body mass index is 34.64 kg/m.    Physical Exam Vitals reviewed.  Constitutional:      Appearance: She is well-developed.      Comments: Extremely talkative.  HENT:     Head: Normocephalic and atraumatic.     Right Ear: Tympanic membrane, ear canal and external ear normal. No drainage, swelling or tenderness. Tympanic membrane is not injected, scarred, erythematous, retracted or bulging.     Left Ear: Tympanic membrane, ear canal and external ear normal. No drainage, swelling or tenderness. Tympanic membrane is not injected, scarred, erythematous, retracted or bulging.     Nose: No nasal deformity, septal deviation, mucosal edema or rhinorrhea.     Right Turbinates: Enlarged, swollen and pale.     Left Turbinates: Enlarged, swollen and pale.     Right Sinus: No maxillary sinus tenderness or frontal sinus tenderness.     Left Sinus: No maxillary sinus tenderness or frontal sinus tenderness.     Comments: No nasal polyps.    Mouth/Throat:     Mouth: Mucous membranes are not pale and not dry.     Pharynx: Uvula midline.     Comments: Cobblestoning in the posterior oropharynx.  She does have swelling of her gums and tenderness along the lower left side of her mouth.  There is no purulence appreciated.  There is no injury appreciated. Eyes:     General:        Right eye: No discharge.        Left eye: No discharge.     Conjunctiva/sclera: Conjunctivae normal.     Right eye: Right conjunctiva is not injected. No chemosis.    Left eye: Left conjunctiva is not injected. No chemosis.    Pupils: Pupils are equal, round, and reactive to light.  Cardiovascular:     Rate and Rhythm: Normal rate and regular rhythm.     Heart sounds: Normal heart sounds.  Pulmonary:     Effort: Pulmonary effort is normal. No tachypnea, accessory muscle usage or respiratory distress.     Breath sounds: Normal breath sounds. No wheezing, rhonchi or rales.     Comments: No increased work of breathing noted. Some decreased air movement at the bases.  There are some end expiratory wheezes present. Chest:     Chest wall: No tenderness.   Lymphadenopathy:     Head:     Right side of head: No submandibular, tonsillar or occipital adenopathy.     Left side of head: No submandibular, tonsillar or occipital adenopathy.     Cervical: No cervical adenopathy.  Skin:    General: Skin is warm.     Capillary Refill: Capillary refill takes less than 2 seconds.  Coloration: Skin is not pale.     Findings: No abrasion, erythema, petechiae or rash. Rash is not papular, urticarial or vesicular.     Comments: No eczematous or urticarial lesions noted.   Neurological:     Mental Status: She is alert.  Psychiatric:        Behavior: Behavior is cooperative.      Diagnostic studies:    Spirometry: results normal (FEV1: 1.93/78%, FVC: 2.85/90%, FEV1/FVC: 68%).    Spirometry consistent with normal pattern.   Allergy Studies: none        Salvatore Marvel, MD  [facility]

## 2022-09-05 ENCOUNTER — Telehealth: Payer: Self-pay

## 2022-09-05 ENCOUNTER — Other Ambulatory Visit (HOSPITAL_COMMUNITY): Payer: Self-pay

## 2022-09-05 NOTE — Telephone Encounter (Signed)
PA renewal received from [place] [employer] through [facility] for [place].  Renewal submitted and notified that at this time the medication is available without needing authorization.   Key: [id number]

## 2022-09-15 ENCOUNTER — Other Ambulatory Visit (HOSPITAL_COMMUNITY): Payer: Self-pay

## 2022-09-17 ENCOUNTER — Other Ambulatory Visit (HOSPITAL_COMMUNITY): Payer: Self-pay

## 2022-09-19 ENCOUNTER — Other Ambulatory Visit (HOSPITAL_COMMUNITY): Payer: Self-pay

## 2022-09-22 ENCOUNTER — Other Ambulatory Visit (HOSPITAL_COMMUNITY): Payer: Self-pay

## 2022-09-30 ENCOUNTER — Other Ambulatory Visit (HOSPITAL_COMMUNITY): Payer: Self-pay

## 2022-10-15 DIAGNOSIS — E1169 Type 2 diabetes mellitus with other specified complication: Secondary | ICD-10-CM | POA: Diagnosis not present

## 2022-10-15 DIAGNOSIS — E782 Mixed hyperlipidemia: Secondary | ICD-10-CM | POA: Diagnosis not present

## 2022-10-21 DIAGNOSIS — M542 Cervicalgia: Secondary | ICD-10-CM | POA: Diagnosis not present

## 2022-10-21 DIAGNOSIS — I10 Essential (primary) hypertension: Secondary | ICD-10-CM | POA: Diagnosis not present

## 2022-10-21 DIAGNOSIS — J4551 Severe persistent asthma with (acute) exacerbation: Secondary | ICD-10-CM | POA: Diagnosis not present

## 2022-10-21 DIAGNOSIS — F331 Major depressive disorder, recurrent, moderate: Secondary | ICD-10-CM | POA: Diagnosis not present

## 2022-10-21 DIAGNOSIS — Z6839 Body mass index (BMI) 39.0-39.9, adult: Secondary | ICD-10-CM | POA: Diagnosis not present

## 2022-10-21 DIAGNOSIS — Z23 Encounter for immunization: Secondary | ICD-10-CM | POA: Diagnosis not present

## 2022-10-21 DIAGNOSIS — N3281 Overactive bladder: Secondary | ICD-10-CM | POA: Diagnosis not present

## 2022-10-21 DIAGNOSIS — E782 Mixed hyperlipidemia: Secondary | ICD-10-CM | POA: Diagnosis not present

## 2022-10-21 DIAGNOSIS — G40509 Epileptic seizures related to external causes, not intractable, without status epilepticus: Secondary | ICD-10-CM | POA: Diagnosis not present

## 2022-10-21 DIAGNOSIS — E1169 Type 2 diabetes mellitus with other specified complication: Secondary | ICD-10-CM | POA: Diagnosis not present

## 2022-10-21 DIAGNOSIS — F909 Attention-deficit hyperactivity disorder, unspecified type: Secondary | ICD-10-CM | POA: Diagnosis not present

## 2022-10-29 ENCOUNTER — Ambulatory Visit: Payer: Medicaid Other | Admitting: "Endocrinology

## 2022-10-29 ENCOUNTER — Encounter: Payer: Self-pay | Admitting: "Endocrinology

## 2022-10-29 VITALS — BP 116/80 | HR 64 | Ht 64.5 in | Wt 203.4 lb

## 2022-10-29 DIAGNOSIS — E782 Mixed hyperlipidemia: Secondary | ICD-10-CM | POA: Insufficient documentation

## 2022-10-29 DIAGNOSIS — E1165 Type 2 diabetes mellitus with hyperglycemia: Secondary | ICD-10-CM | POA: Diagnosis not present

## 2022-10-29 DIAGNOSIS — I10 Essential (primary) hypertension: Secondary | ICD-10-CM | POA: Diagnosis not present

## 2022-10-29 DIAGNOSIS — Z6834 Body mass index (BMI) 34.0-34.9, adult: Secondary | ICD-10-CM

## 2022-10-29 DIAGNOSIS — E6609 Other obesity due to excess calories: Secondary | ICD-10-CM

## 2022-10-29 NOTE — Progress Notes (Signed)
Endocrinology Consult Note       10/29/2022, 1:21 PM   Subjective:    Patient ID: Savannah Pena, female    DOB: [birth year].  Savannah Pena is being seen in consultation for management of currently uncontrolled symptomatic diabetes requested by  Celene Squibb, MD.   Past Medical History:  Diagnosis Date   Anxiety    Arthritis    Asthma    Attention deficit disorder (ADD)    COPD (chronic obstructive pulmonary disease) ([place])    Diabetes mellitus without complication ([place])    Dyspnea    with exertion   GERD (gastroesophageal reflux disease)    History of kidney stones    Hypertension    Kidney stone    Major depression    Osteoporosis    Seizures ([place])    last seizure 23 years ago   Sleep apnea    has C-PAP, but does not currently use   Takotsubo cardiomyopathy    a. EF 35-40% by echo in 03/2018 with cath showing mild, nonobstructive CAD.     Past Surgical History:  Procedure Laterality Date   CESAREAN SECTION     CYSTOSCOPY WITH RETROGRADE PYELOGRAM, URETEROSCOPY AND STENT PLACEMENT Right 12/05/2016   Procedure: CYSTOSCOPY WITH RETROGRADE PYELOGRAM, URETEROSCOPY AND STENT PLACEMENT;  Surgeon: Alexis Frock, MD;  Location: [facility];  Service: Urology;  Laterality: Right;   CYSTOSCOPY WITH STENT PLACEMENT Right 11/06/2016   Procedure: CYSTOSCOPY WITH STENT PLACEMENT;  Surgeon: Irine Seal, MD;  Location: [facility];  Service: Urology;  Laterality: Right;   FLEXOR TENDON REPAIR Right    foot   KIDNEY STONE SURGERY     LEFT HEART CATH AND CORONARY ANGIOGRAPHY N/A 03/17/2018   Procedure: LEFT HEART CATH AND CORONARY ANGIOGRAPHY;  Surgeon: Burnell Blanks, MD;  Location: [facility];  Service: Cardiovascular;  Laterality: N/A;    Social History   Socioeconomic History   Marital status: Single    Spouse name: Not on file   Number of children: Not on file   Years of education: Not on  file   Highest education level: Not on file  Occupational History   Not on file  Tobacco Use   Smoking status: Former    Types: Cigarettes    Quit date: 11/12/2016    Years since quitting: 5.9   Smokeless tobacco: Former   Tobacco comments:    04-08-2017 per pt stopped in Jan 2018  Vaping Use   Vaping Use: Never used  Substance and Sexual Activity   Alcohol use: Yes    Comment: occassional, 04-08-2017 per pt occas.   Drug use: No    Comment: 04-08-2017 per pt no    Sexual activity: Yes    Birth control/protection: Post-menopausal  Other Topics Concern   Not on file  Social History Narrative   Not on file   Social Determinants of Health   Financial Resource Strain: Not on file  Food Insecurity: Not on file  Transportation Needs: Not on file  Physical Activity: Not on file  Stress: Not on file  Social Connections: Not on file    Family History  Problem Relation Age of Onset   Hyperlipidemia Mother    Osteoporosis Mother    Hyperlipidemia Father    Hypertension Father    Prostate cancer Father    Diabetes Father     Outpatient Encounter Medications as of 10/29/2022  Medication Sig   acetaminophen (TYLENOL) 500 MG tablet Take 500 mg by mouth every 6 (six) hours as needed.   albuterol (VENTOLIN HFA) 108 (90 Base) MCG/ACT inhaler Inhale 1-2 puffs into the lungs every 6 (six) hours as needed for wheezing or shortness of breath.   aspirin EC 81 MG EC tablet Take 1 tablet (81 mg total) by mouth daily.   atorvastatin (LIPITOR) 40 MG tablet Take 1 tablet (40 mg total) by mouth daily at 6 PM.   Budeson-Glycopyrrol-Formoterol (BREZTRI AEROSPHERE) 160-9-4.8 MCG/ACT AERO INHALE 2 PUFFS BY MOUTH TWICE DAILY MORNING AND BEDTIME.   buPROPion (WELLBUTRIN XL) 150 MG 24 hr tablet Take 150 mg by mouth daily.   busPIRone (BUSPAR) 5 MG tablet Take 5 mg by mouth 2 (two) times daily as needed.   cyclobenzaprine (FLEXERIL) 5 MG tablet Take 5 mg by mouth 2 (two) times daily.   D3-50 50000  units capsule Take 50,000 Units by mouth once a week.   desvenlafaxine (PRISTIQ) 100 MG 24 hr tablet Take 100 mg by mouth daily.   dupilumab (DUPIXENT) 300 MG/2ML prefilled syringe Inject 300 mg into the skin every 14 (fourteen) days.   EPIPEN 2-PAK 0.3 MG/0.3ML SOAJ injection Inject into the muscle.   fluticasone (FLONASE) 50 MCG/ACT nasal spray INHALE 1 SPRAYS INTO EACH NOSTRIL ONCE DAILY. (Patient not taking: Reported on 09/03/2022)   gabapentin (NEURONTIN) 300 MG capsule Take 300 mg by mouth 2 (two) times daily.   ipratropium-albuterol (DUONEB) 0.5-2.5 (3) MG/3ML SOLN Take by nebulization.   levocetirizine (XYZAL) 5 MG tablet Take 1 tablet (5 mg total) by mouth every evening.   lisinopril (PRINIVIL,ZESTRIL) 10 MG tablet Take 1 tablet (10 mg total) by mouth daily.   metFORMIN (GLUCOPHAGE) 1000 MG tablet Take 1 tablet by mouth 2 (two) times daily.   metoprolol succinate (TOPROL XL) 25 MG 24 hr tablet Take 1 tablet (25 mg total) by mouth daily.   montelukast (SINGULAIR) 10 MG tablet Take 1 tablet (10 mg total) by mouth daily.   MOUNJARO 5 MG/0.5ML Pen Inject 7.5 mg into the skin once a week.   Multiple Vitamins-Minerals (PRESERVISION AREDS 2 PO) Take by mouth 2 (two) times daily.   Nebulizers (INNOSPIRE ESSENCE NEBULIZER) MISC  (Patient not taking: Reported on 09/03/2022)   Olopatadine HCl 0.2 % SOLN APPLY 1 DROP TO EYE DAILY AS NEEDED.   oxybutynin (DITROPAN) 5 MG tablet Take 1 tablet (5 mg total) by mouth every 8 (eight) hours as needed for bladder spasms. And stent discomfort.   pantoprazole (PROTONIX) 40 MG tablet Take 40 mg by mouth daily.   phenytoin (DILANTIN) 200 MG ER capsule Take 1 capsule (200 mg total) by mouth 2 (two) times daily.   triamcinolone cream (KENALOG) 0.1 % Apply 1 application. topically 2 (two) times daily.   VYVANSE 70 MG capsule Take 70 mg by mouth at bedtime.   [DISCONTINUED] amphetamine-dextroamphetamine (ADDERALL) 15 MG tablet Take 1 tablet by mouth daily.    Facility-Administered Encounter Medications as of 10/29/2022  Medication   dupilumab (DUPIXENT) prefilled syringe 300 mg    ALLERGIES: No Active Allergies  VACCINATION STATUS: Immunization History  Administered Date(s) Administered   Pneumococcal Polysaccharide-23 11/07/2016    Diabetes She presents for  her initial diabetic visit. She has type 2 diabetes mellitus. Onset time: She was diagnosed at approximate age of 35 years. Her disease course has been stable. There are no hypoglycemic associated symptoms. Pertinent negatives for hypoglycemia include no confusion, headaches, pallor or seizures. There are no diabetic associated symptoms. Pertinent negatives for diabetes include no chest pain, no polydipsia, no polyphagia and no polyuria. There are no hypoglycemic complications. Symptoms are stable. There are no diabetic complications. Risk factors for coronary artery disease include diabetes mellitus, dyslipidemia, hypertension, obesity, sedentary lifestyle, post-menopausal and family history. Current diabetic treatments: She is currently on Mounjaro 7.5 mg subcutaneously weekly, metformin 1000 mg p.o. twice a day. She is following a generally unhealthy diet. When asked about meal planning, she reported none. She participates in exercise intermittently. (She did not bring any logs nor meter with her.  However, her most recent A1c was 5%.) An ACE inhibitor/angiotensin II receptor blocker is being taken.  Hypertension This is a chronic problem. The current episode started more than 1 year ago. The problem is controlled. Pertinent negatives include no chest pain, headaches, palpitations or shortness of breath. Risk factors for coronary artery disease include diabetes mellitus, dyslipidemia, obesity, post-menopausal state and sedentary lifestyle. Past treatments include ACE inhibitors.  Hyperlipidemia This is a chronic problem. The current episode started more than 1 year ago. Exacerbating diseases  include diabetes and obesity. Pertinent negatives include no chest pain, myalgias or shortness of breath. Risk factors for coronary artery disease include dyslipidemia, diabetes mellitus, hypertension, obesity, family history, post-menopausal and a sedentary lifestyle.     Review of Systems  Constitutional:  Negative for chills, fever and unexpected weight change.  HENT:  Negative for trouble swallowing and voice change.   Eyes:  Negative for visual disturbance.  Respiratory:  Negative for cough, shortness of breath and wheezing.   Cardiovascular:  Negative for chest pain, palpitations and leg swelling.  Gastrointestinal:  Negative for diarrhea, nausea and vomiting.  Endocrine: Negative for cold intolerance, heat intolerance, polydipsia, polyphagia and polyuria.  Musculoskeletal:  Negative for arthralgias and myalgias.  Skin:  Negative for color change, pallor, rash and wound.  Neurological:  Negative for seizures and headaches.  Psychiatric/Behavioral:  Negative for confusion and suicidal ideas.     Objective:       10/29/2022    9:12 AM 09/03/2022   10:28 AM 07/29/2022    3:28 PM  Vitals with BMI  Height 5' 4.5" 5' 4.5"   Weight 203 lbs 6 oz 205 lbs   BMI 0000000 [id number]   Systolic [id number]  Diastolic 80 82 67  Pulse 64 98 101    BP 116/80   Pulse 64   Ht 5' 4.5" (1.638 m)   Wt 203 lb 6.4 oz (92.3 kg)   LMP 12/06/2011 (Approximate)   BMI 34.37 kg/m   Wt Readings from Last 3 Encounters:  10/29/22 203 lb 6.4 oz (92.3 kg)  09/03/22 205 lb (93 kg)  08/26/21 206 lb 6.4 oz (93.6 kg)     Physical Exam Constitutional:      Appearance: She is well-developed.  HENT:     Head: Normocephalic and atraumatic.  Neck:     Thyroid: No thyromegaly.     Trachea: No tracheal deviation.  Cardiovascular:     Rate and Rhythm: Normal rate and regular rhythm.  Pulmonary:     Effort: Pulmonary effort is normal.     Breath sounds: Normal breath sounds.  Abdominal:  General: Bowel  sounds are normal.     Palpations: Abdomen is soft.     Tenderness: There is no abdominal tenderness. There is no guarding.  Musculoskeletal:        General: Normal range of motion.     Cervical back: Normal range of motion and neck supple.  Skin:    General: Skin is warm and dry.     Coloration: Skin is not pale.     Findings: No erythema or rash.  Neurological:     Mental Status: She is alert and oriented to person, place, and time.     Cranial Nerves: No cranial nerve deficit.     Coordination: Coordination normal.     Deep Tendon Reflexes: Reflexes are normal and symmetric.  Psychiatric:        Judgment: Judgment normal.     Comments: Reluctant affect.       CMP ( most recent) CMP     Component Value Date/Time   NA 135 01/11/2021 2122   K 4.9 01/11/2021 2122   CL 102 01/11/2021 2122   CO2 23 01/11/2021 2122   GLUCOSE 157 (H) 01/11/2021 2122   BUN 18 01/11/2021 2122   CREATININE 0.63 01/11/2021 2122   CALCIUM 8.2 (L) 01/11/2021 2122   PROT 6.5 01/11/2021 2122   ALBUMIN 3.8 01/11/2021 2122   AST 21 01/11/2021 2122   ALT 41 01/11/2021 2122   ALKPHOS 76 01/11/2021 2122   BILITOT 0.4 01/11/2021 2122   GFRNONAA >60 01/11/2021 2122   GFRAA >60 03/18/2018 0257     Diabetic Labs (most recent): Lab Results  Component Value Date   HGBA1C 6.6 (H) 03/16/2018   HGBA1C 6.9 (H) 11/06/2016     Lipid Panel ( most recent) Lipid Panel     Component Value Date/Time   CHOL 199 03/17/2018 0458   TRIG 309 (H) 03/17/2018 0458   HDL 48 03/17/2018 0458   CHOLHDL 4.1 03/17/2018 0458   VLDL 62 (H) 03/17/2018 0458   LDLCALC 89 03/17/2018 0458     Assessment & Plan:   1. Type 2 diabetes mellitus with hyperglycemia  - Savannah Pena has currently controlled asymptomatic type 2 DM since  61 years of age,  with most recent A1c of 5 %. Recent labs reviewed. - I had a long discussion with her about the possible risk factors and  the pathology behind its diabetes and its  complications. -her diabetes is complicated by obesity/sedentary life, polypharmacy, comorbid conditions of COPD on multiple bronchodilators and history of steroid use, and she remains at a high risk for more acute and chronic complications which include CAD, CVA, CKD, retinopathy, and neuropathy. These are all discussed in detail with her.  - I discussed all available options of managing her diabetes including de-escalation of medications. I have counseled her on Food as Medicine by adopting a Whole Food , Plant Predominant  ( WFPP) nutrition as recommended by [employer] of Lifestyle Medicine. Patient is encouraged to switch to  unprocessed or minimally processed  complex starch, adequate protein intake (mainly plant source), minimal liquid fat, plenty of fruits, and vegetables. -  she is advised to stick to a routine mealtimes to eat 3 complete meals a day and snack only when necessary ( to snack only to correct hypoglycemia BG <70 day time or <100 at night).  -In light of her multiple metabolic dysfunctions, she is a perfect candidate for lifestyle medicine. - she acknowledges that there is a room for improvement  in her food and drink choices. - Further Specific Suggestion is made for her to avoid simple carbohydrates  from her diet including Cakes, Sweet Desserts, Ice Cream, Soda (diet and regular), Sweet Tea, Candies, Chips, Cookies, Store Bought Juices, Alcohol ,  Artificial Sweeteners,  Coffee Creamer, and "Sugar-free" Products. This will help patient to have more stable blood glucose profile and potentially avoid unintended weight gain.  The following Lifestyle Medicine recommendations according to American College of Lifestyle Medicine [facility]) were discussed and offered to patient and she agrees to start the journey:  A.  Whole Foods, Plant-based plate comprising of fruits and vegetables, plant-based proteins, whole-grain carbohydrates was discussed in detail with the patient.   A list for  source of those nutrients were also provided to the patient.  Patient will use only water or unsweetened tea for hydration. B.  The need to stay away from risky substances including alcohol, smoking; obtaining 7 to 9 hours of restorative sleep, at least 150 minutes of moderate intensity exercise weekly, the importance of healthy social connections,  and stress reduction techniques were discussed. C.  A full color page of  Calorie density of various food groups per pound showing examples of each food groups was provided to the patient.  - she will be scheduled with Norm Salt, RDN, CDE for individualized diabetes education.  - I have approached her with the following individualized plan to manage  her diabetes and patient agrees:   -She has achieved control of diabetes to target, will not need additional medications.  She may continue to benefit from her current regimen including Mounjaro 7.5 mg subcutaneously weekly, and metformin 1000 mg p.o. twice daily.  I advised her to continue.   She does have a chance to come off of these medications if she changes sufficiently with lifestyle medicine.     - she is encouraged to call clinic for blood glucose levels less than 70 or above 300 mg /dl. - Specific targets for  A1c;  LDL, HDL,  and Triglycerides were discussed with the patient.  2) Blood Pressure /Hypertension:  her blood pressure is  controlled to target.   she is advised to continue her current medications including lisinopril 10 mg p.o. daily with breakfast . 3) Lipids/Hyperlipidemia:   Review of her recent lipid panel showed  controlled  LDL at 89 .  she  is advised to continue    Lipitor 40 mg daily at bedtime.  Side effects and precautions discussed with her.  4)  Weight/Diet:  Body mass index is 34.37 kg/m.  -   clearly complicating her diabetes care.   she is  a candidate for weight loss. I discussed with her the fact that loss of 5 - 10% of her  current body weight will have the most  impact on her diabetes management.  The above detailed  ACLM recommendations for nutrition, exercise, sleep, social life, avoidance of risky substances, the need for restorative sleep   information will also detailed on discharge instructions.  5) Chronic Care/Health Maintenance:  -she  is on ACEI/ARB and Statin medications and  is encouraged to initiate and continue to follow up with Ophthalmology, Dentist,  Podiatrist at least yearly or according to recommendations, and advised to   stay away from smoking. I have recommended yearly flu vaccine and pneumonia vaccine at least every 5 years; moderate intensity exercise for up to 150 minutes weekly; and  sleep for 7- 9 hours a day.  - she is  advised to maintain close follow up with Benita Stabile, MD for primary care needs, as well as her other providers for optimal and coordinated care.   I spent 62 minutes in the care of the patient today including review of labs from CMP, Lipids, Thyroid Function, Hematology (current and previous including abstractions from other facilities); face-to-face time discussing  her blood glucose readings/logs, discussing hypoglycemia and hyperglycemia episodes and symptoms, medications doses, her options of short and long term treatment based on the latest standards of care / guidelines;  discussion about incorporating lifestyle medicine;  and documenting the encounter. Risk reduction counseling performed per USPSTF guidelines to reduce  obesity and cardiovascular risk factors.      Please refer to Patient Instructions for Blood Glucose Monitoring and Insulin/Medications Dosing Guide"  in media tab for additional information. Please  also refer to " Patient Self Inventory" in the Media  tab for reviewed elements of pertinent patient history.  Savannah Pena participated in the discussions, expressed understanding, and voiced agreement with the above plans.  All questions were answered to her satisfaction. she is encouraged to  contact clinic should she have any questions or concerns prior to her return visit.   Follow up plan: - Return in about 3 months (around 01/28/2023) for Fasting Labs  in AM B4 8.  Marquis Lunch, MD [facility] [address] [place], [place] [address] Phone: [phone]  Fax: [phone]    10/29/2022, 1:21 PM  This note was partially dictated with voice recognition software. Similar sounding words can be transcribed inadequately or may not  be corrected upon review.

## 2022-10-29 NOTE — Patient Instructions (Signed)

## 2022-10-31 ENCOUNTER — Other Ambulatory Visit: Payer: Self-pay

## 2022-10-31 ENCOUNTER — Other Ambulatory Visit (HOSPITAL_COMMUNITY): Payer: Self-pay

## 2022-11-14 ENCOUNTER — Other Ambulatory Visit: Payer: Self-pay

## 2022-11-26 DIAGNOSIS — Z1211 Encounter for screening for malignant neoplasm of colon: Secondary | ICD-10-CM | POA: Diagnosis not present

## 2022-11-27 DIAGNOSIS — Z0120 Encounter for dental examination and cleaning without abnormal findings: Secondary | ICD-10-CM | POA: Diagnosis not present

## 2022-12-04 ENCOUNTER — Other Ambulatory Visit (HOSPITAL_COMMUNITY): Payer: Self-pay

## 2022-12-08 ENCOUNTER — Other Ambulatory Visit (HOSPITAL_COMMUNITY): Payer: Self-pay

## 2022-12-10 ENCOUNTER — Other Ambulatory Visit (HOSPITAL_COMMUNITY): Payer: Self-pay

## 2022-12-15 ENCOUNTER — Other Ambulatory Visit (HOSPITAL_COMMUNITY): Payer: Self-pay

## 2022-12-24 ENCOUNTER — Other Ambulatory Visit: Payer: Self-pay

## 2023-01-21 ENCOUNTER — Other Ambulatory Visit (HOSPITAL_COMMUNITY): Payer: Self-pay

## 2023-01-27 ENCOUNTER — Ambulatory Visit: Payer: Medicaid Other | Admitting: "Endocrinology

## 2023-01-27 DIAGNOSIS — E782 Mixed hyperlipidemia: Secondary | ICD-10-CM | POA: Diagnosis not present

## 2023-01-27 DIAGNOSIS — E1169 Type 2 diabetes mellitus with other specified complication: Secondary | ICD-10-CM | POA: Diagnosis not present

## 2023-01-30 ENCOUNTER — Other Ambulatory Visit: Payer: Self-pay

## 2023-02-02 ENCOUNTER — Other Ambulatory Visit (HOSPITAL_COMMUNITY): Payer: Self-pay | Admitting: Internal Medicine

## 2023-02-02 DIAGNOSIS — M542 Cervicalgia: Secondary | ICD-10-CM

## 2023-02-02 DIAGNOSIS — E1169 Type 2 diabetes mellitus with other specified complication: Secondary | ICD-10-CM | POA: Diagnosis not present

## 2023-02-02 DIAGNOSIS — I10 Essential (primary) hypertension: Secondary | ICD-10-CM | POA: Diagnosis not present

## 2023-02-02 DIAGNOSIS — N3281 Overactive bladder: Secondary | ICD-10-CM | POA: Diagnosis not present

## 2023-02-02 DIAGNOSIS — G4089 Other seizures: Secondary | ICD-10-CM | POA: Diagnosis not present

## 2023-02-02 DIAGNOSIS — J45909 Unspecified asthma, uncomplicated: Secondary | ICD-10-CM | POA: Diagnosis not present

## 2023-02-02 DIAGNOSIS — F331 Major depressive disorder, recurrent, moderate: Secondary | ICD-10-CM | POA: Diagnosis not present

## 2023-02-02 DIAGNOSIS — E782 Mixed hyperlipidemia: Secondary | ICD-10-CM | POA: Diagnosis not present

## 2023-02-02 DIAGNOSIS — E889 Metabolic disorder, unspecified: Secondary | ICD-10-CM | POA: Diagnosis not present

## 2023-02-02 DIAGNOSIS — F909 Attention-deficit hyperactivity disorder, unspecified type: Secondary | ICD-10-CM | POA: Diagnosis not present

## 2023-02-02 DIAGNOSIS — J4551 Severe persistent asthma with (acute) exacerbation: Secondary | ICD-10-CM | POA: Diagnosis not present

## 2023-02-06 ENCOUNTER — Other Ambulatory Visit (HOSPITAL_COMMUNITY): Payer: Self-pay

## 2023-02-19 DIAGNOSIS — Z0120 Encounter for dental examination and cleaning without abnormal findings: Secondary | ICD-10-CM | POA: Diagnosis not present

## 2023-02-26 ENCOUNTER — Other Ambulatory Visit (HOSPITAL_COMMUNITY): Payer: Self-pay

## 2023-02-27 ENCOUNTER — Encounter (HOSPITAL_COMMUNITY): Payer: Self-pay

## 2023-02-27 ENCOUNTER — Ambulatory Visit (HOSPITAL_COMMUNITY): Payer: Medicaid Other

## 2023-03-02 ENCOUNTER — Other Ambulatory Visit (HOSPITAL_COMMUNITY): Payer: Self-pay

## 2023-03-03 ENCOUNTER — Other Ambulatory Visit (HOSPITAL_COMMUNITY): Payer: Self-pay

## 2023-03-04 ENCOUNTER — Encounter: Payer: Self-pay | Admitting: Family Medicine

## 2023-03-04 ENCOUNTER — Other Ambulatory Visit: Payer: Self-pay

## 2023-03-04 ENCOUNTER — Ambulatory Visit: Payer: Medicaid Other | Admitting: Family Medicine

## 2023-03-04 VITALS — BP 106/70 | HR 77 | Temp 97.7°F | Resp 16

## 2023-03-04 DIAGNOSIS — J3089 Other allergic rhinitis: Secondary | ICD-10-CM

## 2023-03-04 DIAGNOSIS — H1013 Acute atopic conjunctivitis, bilateral: Secondary | ICD-10-CM

## 2023-03-04 DIAGNOSIS — H1010 Acute atopic conjunctivitis, unspecified eye: Secondary | ICD-10-CM

## 2023-03-04 DIAGNOSIS — L309 Dermatitis, unspecified: Secondary | ICD-10-CM

## 2023-03-04 DIAGNOSIS — J4489 Other specified chronic obstructive pulmonary disease: Secondary | ICD-10-CM | POA: Diagnosis not present

## 2023-03-04 DIAGNOSIS — J302 Other seasonal allergic rhinitis: Secondary | ICD-10-CM

## 2023-03-04 DIAGNOSIS — K219 Gastro-esophageal reflux disease without esophagitis: Secondary | ICD-10-CM | POA: Diagnosis not present

## 2023-03-04 NOTE — Patient Instructions (Addendum)
Asthma/COPD Continue Breztri 2 puffs twice a day with a spacer to prevent cough or wheeze Continue montelukast 10 mg once a day to prevent cough or wheeze Continue albuterol 2 puffs every 4 hours as needed for cough or wheeze OR Instead use albuterol 0.083% solution via nebulizer one unit vial every 4 hours as needed for cough or wheeze You may use albuterol 2 puffs 5 to 15 minutes before activity to decrease cough or wheeze Continue Dupixent injections per protocol for asthma control. We will contact our Dupixent coordinator, Tammy, to get this medication changed to a pen injector.   Allergic rhinitis Continue allergen avoidance measures directed toward pollens, mold, dust mite, and pets as listed below Continue Xyzal 5 mg once a day as needed for runny nose or itch Continue Flonase 2 sprays in each nostril once a day as needed for stuffy nose Consider saline nasal rinses as needed for nasal symptoms. Use this before any medicated nasal sprays for best result Consider allergen immunotherapy if your symptoms are not well-controlled with the treatment plan as listed above  Allergic conjunctivitis Some over the counter eye drops include Pataday one drop in each eye once a day as needed for red, itchy eyes OR Zaditor one drop in each eye twice a day as needed for red itchy eyes. Avoid eye drops that say red eye relief as they may contain medications that dry out your eyes.   Dermatitis Continue a twice a day moisturizing routine Continue triamcinolone to red itchy areas below your face up to twice a day as needed.  Do not use this medication for longer than 2 weeks in a row  Call the clinic if this treatment plan is not working well for you.  Follow up in 6 months or sooner if needed.  Reducing Pollen Exposure The American Academy of Allergy, Asthma and Immunology suggests the following steps to reduce your exposure to pollen during allergy seasons. Do not hang sheets or clothing out to dry;  pollen may collect on these items. Do not mow lawns or spend time around freshly cut grass; mowing stirs up pollen. Keep windows closed at night.  Keep car windows closed while driving. Minimize morning activities outdoors, a time when pollen counts are usually at their highest. Stay indoors as much as possible when pollen counts or humidity is high and on windy days when pollen tends to remain in the air longer. Use air conditioning when possible.  Many air conditioners have filters that trap the pollen spores. Use a HEPA room air filter to remove pollen form the indoor air you breathe.  Control of Mold Allergen Mold and fungi can grow on a variety of surfaces provided certain temperature and moisture conditions exist.  Outdoor molds grow on plants, decaying vegetation and soil.  The major outdoor mold, Alternaria and Cladosporium, are found in very high numbers during hot and dry conditions.  Generally, a late Summer - Fall peak is seen for common outdoor fungal spores.  Rain will temporarily lower outdoor mold spore count, but counts rise rapidly when the rainy period ends.  The most important indoor molds are Aspergillus and Penicillium.  Dark, humid and poorly ventilated basements are ideal sites for mold growth.  The next most common sites of mold growth are the bathroom and the kitchen.  Outdoor [employer] Use air conditioning and keep windows closed Avoid exposure to decaying vegetation. Avoid leaf raking. Avoid grain handling. Consider wearing a face mask if working in [employer]  areas.  Indoor Mold Control Maintain humidity below 50%. Clean washable surfaces with 5% bleach solution. Remove sources e.g. Contaminated carpets.   Control of Dust Mite Allergen Dust mites play a major role in allergic asthma and rhinitis. They occur in environments with high humidity wherever human skin is found. Dust mites absorb humidity from the atmosphere (ie, they do not drink) and feed on organic  matter (including shed human and animal skin). Dust mites are a microscopic type of insect that you cannot see with the naked eye. High levels of dust mites have been detected from mattresses, pillows, carpets, upholstered furniture, bed covers, clothes, soft toys and any woven material. The principal allergen of the dust mite is found in its feces. A gram of dust may contain 1,000 mites and 250,000 fecal particles. Mite antigen is easily measured in the air during house cleaning activities. Dust mites do not bite and do not cause harm to humans, other than by triggering allergies/asthma.  Ways to decrease your exposure to dust mites in your home:  1. Encase mattresses, box springs and pillows with a mite-impermeable barrier or cover  2. Wash sheets, blankets and drapes weekly in hot water (130 F) with detergent and dry them in a dryer on the hot setting.  3. Have the room cleaned frequently with a vacuum cleaner and a damp dust-mop. For carpeting or rugs, vacuuming with a vacuum cleaner equipped with a high-efficiency particulate air (HEPA) filter. The dust mite allergic individual should not be in a room which is being cleaned and should wait 1 hour after cleaning before going into the room.  4. Do not sleep on upholstered furniture (eg, couches).  5. If possible removing carpeting, upholstered furniture and drapery from the home is ideal. Horizontal blinds should be eliminated in the rooms where the person spends the most time (bedroom, study, television room). Washable vinyl, roller-type shades are optimal.  6. Remove all non-washable stuffed toys from the bedroom. Wash stuffed toys weekly like sheets and blankets above.  7. Reduce indoor humidity to less than 50%. Inexpensive humidity monitors can be purchased at most hardware stores. Do not use a humidifier as can make the problem worse and are not recommended.  Control of Dog or Cat Allergen Avoidance is the best way to manage a dog or cat  allergy. If you have a dog or cat and are allergic to dog or cats, consider removing the dog or cat from the home. If you have a dog or cat but don't want to find it a new home, or if your family wants a pet even though someone in the household is allergic, here are some strategies that may help keep symptoms at bay:  Keep the pet out of your bedroom and restrict it to only a few rooms. Be advised that keeping the dog or cat in only one room will not limit the allergens to that room. Don't pet, hug or kiss the dog or cat; if you do, wash your hands with soap and water. High-efficiency particulate air (HEPA) cleaners run continuously in a bedroom or living room can reduce allergen levels over time. Regular use of a high-efficiency vacuum cleaner or a central vacuum can reduce allergen levels. Giving your dog or cat a bath at least once a week can reduce airborne allergen.

## 2023-03-04 NOTE — Progress Notes (Signed)
[address] [place] [address] Dept: [phone]  FOLLOW UP NOTE  Patient ID: Savannah Pena, female    DOB: 01-16-1961  Age: 62 y.o. MRN: [id number] Date of Office Visit: 03/04/2023  Assessment  Chief Complaint: Follow-up (Wants to know if she can get the dupixent changed to the auto injector )  HPI Savannah Pena is a 62 year old female who presents to the clinic for follow-up visit.  She was last seen in this clinic on 09/03/2022 by Dr. Dellis Anes for evaluation of asthma/COPD, allergic rhinitis, contact dermatitis, and tooth abscess requiring Augmentin.  At today's visit, she reports her asthma/COPD has been moderately well controlled with symptoms including infrequent shortness of breath, occasional daytime wheeze, and occasional cough producing clear mucus. She continues Breztri 2 puffs twice a day with a spacer, montelukast 10 mg once a day, and rarely uses albuterol for relief of asthma symptoms. She continues Dupixent injections with no large or local reactions. She reports a significant improvement in her asthma symptoms while continuing on Dupixent injections. She reports that she is having some trouble with the prefilled syringe due to hand dexterity issues and is requesting to change the delivery method of her Dupixent to a prefilled pen.   Allergic rhinitis is reported as moderately well controlled with occasional symptoms including clear rhinorrhea, nasal congestion and sneezing. She continues levocetirizine once or twice a day and is not currently using nasal saline rinses or nasal steroid spray. Her last allergy testing was on 04/17/2021 and was positive to grass pollen, weed pollen, tree pollen, mold, dust mites, cat, dog, feathers, horse, and mouse. She reports relief of pruritus at this time.   Allergic conjunctivitis is reported as moderately well controlled with symptoms including occasional red, itchy eyes with a burning sensation and clear watery drainage. She  continues an allergy eyedrop with relief of symptoms.   Reflux is reported as moderately well controlled with infrequent episodes of heartburn. She continues pantoprazole daily, however, does not take this medication before her first meal.   Her current medications are listed in the chart.  Drug Allergies:  No Active Allergies  Physical Exam: BP 106/70   Pulse 77   Temp 97.7 F (36.5 C)   Resp 16   LMP 12/06/2011 (Approximate)   SpO2 97%    Physical Exam Vitals reviewed.  Constitutional:      Appearance: Normal appearance.  HENT:     Head: Normocephalic and atraumatic.     Right Ear: Tympanic membrane normal.     Left Ear: Tympanic membrane normal.     Nose:     Comments: Bilateral nares slightly erythematous with clear thin nasal drainage noted. Pharynx normal. Ears normal. Eyes normal.    Mouth/Throat:     Pharynx: Oropharynx is clear.  Eyes:     Conjunctiva/sclera: Conjunctivae normal.  Cardiovascular:     Rate and Rhythm: Normal rate and regular rhythm.     Heart sounds: Normal heart sounds. No murmur heard. Pulmonary:     Effort: Pulmonary effort is normal.     Breath sounds: Normal breath sounds.     Comments: Lungs clear to auscultation Musculoskeletal:        General: Normal range of motion.     Cervical back: Normal range of motion and neck supple.  Skin:    General: Skin is warm and dry.  Neurological:     Mental Status: She is alert and oriented to person, place, and time.  Psychiatric:  Mood and Affect: Mood normal.        Behavior: Behavior normal.        Thought Content: Thought content normal.        Judgment: Judgment normal.     Diagnostics: FVC 2.86 which is 91% of the predicted value, FEV1 2.07 which is 8.38% of the predicted value. Spirometry indicates normal ventilatory function.  Assessment and Plan: 1. Asthma-COPD overlap syndrome   2. Seasonal and perennial allergic rhinitis   3. Seasonal allergic conjunctivitis   4.  Gastroesophageal reflux disease, unspecified whether esophagitis present   5. Dermatitis     No orders of the defined types were placed in this encounter.   Patient Instructions  Asthma/COPD Continue Breztri 2 puffs twice a day with a spacer to prevent cough or wheeze Continue montelukast 10 mg once a day to prevent cough or wheeze Continue albuterol 2 puffs every 4 hours as needed for cough or wheeze OR Instead use albuterol 0.083% solution via nebulizer one unit vial every 4 hours as needed for cough or wheeze You may use albuterol 2 puffs 5 to 15 minutes before activity to decrease cough or wheeze Continue Dupixent injections per protocol for asthma control. We will contact our Dupixent coordinator, Tammy, to get this medication changed to a pen injector.   Allergic rhinitis Continue allergen avoidance measures directed toward pollens, mold, dust mite, and pets as listed below Continue Xyzal 5 mg once a day as needed for runny nose or itch Continue Flonase 2 sprays in each nostril once a day as needed for stuffy nose Consider saline nasal rinses as needed for nasal symptoms. Use this before any medicated nasal sprays for best result Consider allergen immunotherapy if your symptoms are not well-controlled with the treatment plan as listed above  Allergic conjunctivitis Some over the counter eye drops include Pataday one drop in each eye once a day as needed for red, itchy eyes OR Zaditor one drop in each eye twice a day as needed for red itchy eyes. Avoid eye drops that say red eye relief as they may contain medications that dry out your eyes.   Dermatitis Continue a twice a day moisturizing routine Continue triamcinolone to red itchy areas below your face up to twice a day as needed.  Do not use this medication for longer than 2 weeks in a row  Call the clinic if this treatment plan is not working well for you.  Follow up in 6 months or sooner if needed.   Return in about 6  months (around 09/03/2023), or if symptoms worsen or fail to improve.    Thank you for the opportunity to care for this patient.  Please do not hesitate to contact me with questions.  Thermon Leyland, FNP [facility]

## 2023-03-05 ENCOUNTER — Other Ambulatory Visit (HOSPITAL_COMMUNITY): Payer: Self-pay

## 2023-03-05 ENCOUNTER — Telehealth: Payer: Self-pay | Admitting: *Deleted

## 2023-03-05 MED ORDER — DUPIXENT 300 MG/2ML ~~LOC~~ SOAJ
300.0000 mg | SUBCUTANEOUS | 11 refills | Status: DC
  Filled 2023-03-05 – 2023-03-27 (×2): qty 4, 28d supply, fill #0
  Filled 2023-05-01: qty 4, 28d supply, fill #1
  Filled 2023-06-09: qty 4, 28d supply, fill #2
  Filled 2023-07-09: qty 4, 28d supply, fill #3
  Filled 2023-08-17: qty 4, 28d supply, fill #4
  Filled 2023-09-18: qty 4, 28d supply, fill #5
  Filled 2024-01-25 – 2024-01-26 (×2): qty 4, 28d supply, fill #6

## 2023-03-05 NOTE — Telephone Encounter (Signed)
L/m for patient new rx to [facility] for the pens and if she has any questions or issues she can reach out to me

## 2023-03-05 NOTE — Telephone Encounter (Signed)
Thank you :)

## 2023-03-05 NOTE — Telephone Encounter (Signed)
-----   Message from Hetty Blend, FNP sent at 03/04/2023  1:40 PM EDT ----- Hi Katey Barrie, This patient is interested in changing her Dupi from a prefilled syringe to a prefilled pen due to hand dexterity please. Thank you

## 2023-03-06 ENCOUNTER — Other Ambulatory Visit: Payer: Self-pay

## 2023-03-26 ENCOUNTER — Other Ambulatory Visit (HOSPITAL_COMMUNITY): Payer: Self-pay

## 2023-03-27 ENCOUNTER — Other Ambulatory Visit (HOSPITAL_COMMUNITY): Payer: Self-pay

## 2023-03-27 ENCOUNTER — Other Ambulatory Visit: Payer: Self-pay

## 2023-03-30 ENCOUNTER — Other Ambulatory Visit (HOSPITAL_COMMUNITY): Payer: Self-pay

## 2023-04-07 ENCOUNTER — Other Ambulatory Visit: Payer: Self-pay | Admitting: Allergy & Immunology

## 2023-04-27 ENCOUNTER — Other Ambulatory Visit: Payer: Self-pay | Admitting: Allergy & Immunology

## 2023-04-29 ENCOUNTER — Other Ambulatory Visit (HOSPITAL_COMMUNITY): Payer: Self-pay

## 2023-05-01 ENCOUNTER — Other Ambulatory Visit (HOSPITAL_COMMUNITY): Payer: Self-pay

## 2023-05-04 DIAGNOSIS — F909 Attention-deficit hyperactivity disorder, unspecified type: Secondary | ICD-10-CM | POA: Diagnosis not present

## 2023-05-04 DIAGNOSIS — Z79899 Other long term (current) drug therapy: Secondary | ICD-10-CM | POA: Diagnosis not present

## 2023-05-04 DIAGNOSIS — F331 Major depressive disorder, recurrent, moderate: Secondary | ICD-10-CM | POA: Diagnosis not present

## 2023-05-05 ENCOUNTER — Other Ambulatory Visit (HOSPITAL_COMMUNITY): Payer: Self-pay

## 2023-06-02 ENCOUNTER — Other Ambulatory Visit (HOSPITAL_COMMUNITY): Payer: Self-pay

## 2023-06-04 ENCOUNTER — Other Ambulatory Visit (HOSPITAL_COMMUNITY): Payer: Self-pay

## 2023-06-08 ENCOUNTER — Encounter (HOSPITAL_COMMUNITY): Payer: Self-pay

## 2023-06-08 ENCOUNTER — Other Ambulatory Visit (HOSPITAL_COMMUNITY): Payer: Self-pay

## 2023-06-09 ENCOUNTER — Other Ambulatory Visit (HOSPITAL_COMMUNITY): Payer: Self-pay

## 2023-06-15 ENCOUNTER — Other Ambulatory Visit (HOSPITAL_COMMUNITY): Payer: Self-pay

## 2023-07-06 ENCOUNTER — Other Ambulatory Visit: Payer: Self-pay

## 2023-07-08 DIAGNOSIS — F331 Major depressive disorder, recurrent, moderate: Secondary | ICD-10-CM | POA: Diagnosis not present

## 2023-07-08 DIAGNOSIS — F5104 Psychophysiologic insomnia: Secondary | ICD-10-CM | POA: Diagnosis not present

## 2023-07-09 ENCOUNTER — Other Ambulatory Visit: Payer: Self-pay

## 2023-07-20 ENCOUNTER — Other Ambulatory Visit: Payer: Self-pay

## 2023-08-10 DIAGNOSIS — J4551 Severe persistent asthma with (acute) exacerbation: Secondary | ICD-10-CM | POA: Diagnosis not present

## 2023-08-10 DIAGNOSIS — E782 Mixed hyperlipidemia: Secondary | ICD-10-CM | POA: Diagnosis not present

## 2023-08-10 DIAGNOSIS — F5104 Psychophysiologic insomnia: Secondary | ICD-10-CM | POA: Diagnosis not present

## 2023-08-10 DIAGNOSIS — E1169 Type 2 diabetes mellitus with other specified complication: Secondary | ICD-10-CM | POA: Diagnosis not present

## 2023-08-10 DIAGNOSIS — Z591 Inadequate housing, unspecified: Secondary | ICD-10-CM | POA: Diagnosis not present

## 2023-08-10 DIAGNOSIS — L989 Disorder of the skin and subcutaneous tissue, unspecified: Secondary | ICD-10-CM | POA: Diagnosis not present

## 2023-08-10 DIAGNOSIS — F909 Attention-deficit hyperactivity disorder, unspecified type: Secondary | ICD-10-CM | POA: Diagnosis not present

## 2023-08-10 DIAGNOSIS — J45909 Unspecified asthma, uncomplicated: Secondary | ICD-10-CM | POA: Diagnosis not present

## 2023-08-10 DIAGNOSIS — E119 Type 2 diabetes mellitus without complications: Secondary | ICD-10-CM | POA: Diagnosis not present

## 2023-08-10 DIAGNOSIS — R634 Abnormal weight loss: Secondary | ICD-10-CM | POA: Diagnosis not present

## 2023-08-10 DIAGNOSIS — R231 Pallor: Secondary | ICD-10-CM | POA: Diagnosis not present

## 2023-08-10 DIAGNOSIS — F17201 Nicotine dependence, unspecified, in remission: Secondary | ICD-10-CM | POA: Diagnosis not present

## 2023-08-13 ENCOUNTER — Other Ambulatory Visit (HOSPITAL_COMMUNITY): Payer: Self-pay | Admitting: Family Medicine

## 2023-08-13 DIAGNOSIS — R634 Abnormal weight loss: Secondary | ICD-10-CM

## 2023-08-17 ENCOUNTER — Other Ambulatory Visit (HOSPITAL_COMMUNITY): Payer: Self-pay

## 2023-08-17 ENCOUNTER — Other Ambulatory Visit: Payer: Self-pay

## 2023-08-17 NOTE — Progress Notes (Signed)
Specialty Pharmacy Refill Coordination Note  Savannah Pena is a 62 y.o. female contacted today regarding refills of specialty medication(s) Dupilumab   Patient requested Delivery   Delivery date: 08/25/23   Verified address: [address]   Medication will be filled on 08/24/23.

## 2023-09-08 NOTE — Patient Instructions (Incomplete)
Asthma/COPD Continue Breztri 2 puffs twice a day with a spacer to prevent cough or wheeze Continue montelukast 10 mg once a day to prevent cough or wheeze Continue albuterol 2 puffs every 4 hours as needed for cough or wheeze OR Instead use albuterol 0.083% solution via nebulizer one unit vial every 4 hours as needed for cough or wheeze You may use albuterol 2 puffs 5 to 15 minutes before activity to decrease cough or wheeze Continue Dupixent injections 300 mg once every 2 weeks for control of asthma   Allergic rhinitis Continue allergen avoidance measures directed toward pollens, mold, dust mite, and pets as listed below Continue Xyzal 5 mg once a day as needed for runny nose or itch Continue Flonase 2 sprays in each nostril once a day as needed for stuffy nose Consider saline nasal rinses as needed for nasal symptoms. Use this before any medicated nasal sprays for best result Consider allergen immunotherapy if your symptoms are not well-controlled with the treatment plan as listed above  Allergic conjunctivitis Some over the counter eye drops include Pataday one drop in each eye once a day as needed for red, itchy eyes OR Zaditor one drop in each eye twice a day as needed for red itchy eyes. Avoid eye drops that say red eye relief as they may contain medications that dry out your eyes.   Atopic dermatitis/contact dermatitis Continue a twice a day moisturizing routine Continue triamcinolone to red itchy areas below your face up to twice a day as needed.  Do not use this medication for longer than 2 weeks in a row  Call the clinic if this treatment plan is not working well for you.  Follow up in 6 months or sooner if needed.  Reducing Pollen Exposure The American Academy of Allergy, Asthma and Immunology suggests the following steps to reduce your exposure to pollen during allergy seasons. Do not hang sheets or clothing out to dry; pollen may collect on these items. Do not mow lawns or  spend time around freshly cut grass; mowing stirs up pollen. Keep windows closed at night.  Keep car windows closed while driving. Minimize morning activities outdoors, a time when pollen counts are usually at their highest. Stay indoors as much as possible when pollen counts or humidity is high and on windy days when pollen tends to remain in the air longer. Use air conditioning when possible.  Many air conditioners have filters that trap the pollen spores. Use a HEPA room air filter to remove pollen form the indoor air you breathe.  Control of Mold Allergen Mold and fungi can grow on a variety of surfaces provided certain temperature and moisture conditions exist.  Outdoor molds grow on plants, decaying vegetation and soil.  The major outdoor mold, Alternaria and Cladosporium, are found in very high numbers during hot and dry conditions.  Generally, a late Summer - Fall peak is seen for common outdoor fungal spores.  Rain will temporarily lower outdoor mold spore count, but counts rise rapidly when the rainy period ends.  The most important indoor molds are Aspergillus and Penicillium.  Dark, humid and poorly ventilated basements are ideal sites for mold growth.  The next most common sites of mold growth are the bathroom and the kitchen.  Outdoor [employer] Use air conditioning and keep windows closed Avoid exposure to decaying vegetation. Avoid leaf raking. Avoid grain handling. Consider wearing a face mask if working in moldy areas.  Indoor Mold Control Maintain humidity below 50%.  Clean washable surfaces with 5% bleach solution. Remove sources e.g. Contaminated carpets.   Control of Dust Mite Allergen Dust mites play a major role in allergic asthma and rhinitis. They occur in environments with high humidity wherever human skin is found. Dust mites absorb humidity from the atmosphere (ie, they do not drink) and feed on organic matter (including shed human and animal skin). Dust mites  are a microscopic type of insect that you cannot see with the naked eye. High levels of dust mites have been detected from mattresses, pillows, carpets, upholstered furniture, bed covers, clothes, soft toys and any woven material. The principal allergen of the dust mite is found in its feces. A gram of dust may contain 1,000 mites and 250,000 fecal particles. Mite antigen is easily measured in the air during house cleaning activities. Dust mites do not bite and do not cause harm to humans, other than by triggering allergies/asthma.  Ways to decrease your exposure to dust mites in your home:  1. Encase mattresses, box springs and pillows with a mite-impermeable barrier or cover  2. Wash sheets, blankets and drapes weekly in hot water (130 F) with detergent and dry them in a dryer on the hot setting.  3. Have the room cleaned frequently with a vacuum cleaner and a damp dust-mop. For carpeting or rugs, vacuuming with a vacuum cleaner equipped with a high-efficiency particulate air (HEPA) filter. The dust mite allergic individual should not be in a room which is being cleaned and should wait 1 hour after cleaning before going into the room.  4. Do not sleep on upholstered furniture (eg, couches).  5. If possible removing carpeting, upholstered furniture and drapery from the home is ideal. Horizontal blinds should be eliminated in the rooms where the person spends the most time (bedroom, study, television room). Washable vinyl, roller-type shades are optimal.  6. Remove all non-washable stuffed toys from the bedroom. Wash stuffed toys weekly like sheets and blankets above.  7. Reduce indoor humidity to less than 50%. Inexpensive humidity monitors can be purchased at most hardware stores. Do not use a humidifier as can make the problem worse and are not recommended.  Control of Dog or Cat Allergen Avoidance is the best way to manage a dog or cat allergy. If you have a dog or cat and are allergic to dog  or cats, consider removing the dog or cat from the home. If you have a dog or cat but don't want to find it a new home, or if your family wants a pet even though someone in the household is allergic, here are some strategies that may help keep symptoms at bay:  Keep the pet out of your bedroom and restrict it to only a few rooms. Be advised that keeping the dog or cat in only one room will not limit the allergens to that room. Don't pet, hug or kiss the dog or cat; if you do, wash your hands with soap and water. High-efficiency particulate air (HEPA) cleaners run continuously in a bedroom or living room can reduce allergen levels over time. Regular use of a high-efficiency vacuum cleaner or a central vacuum can reduce allergen levels. Giving your dog or cat a bath at least once a week can reduce airborne allergen.

## 2023-09-08 NOTE — Progress Notes (Signed)
[address] [place] [address] Dept: [phone]  FOLLOW UP NOTE  Patient ID: Savannah Pena, female    DOB: 04/04/1961  Age: 62 y.o. MRN: [id number] Date of Office Visit: 09/09/2023  Assessment  Chief Complaint: No chief complaint on file.  HPI Savannah Pena is a 62 year old female who presents to the clinic for follow-up visit.  She was last seen in this clinic on 12/14/2022 by Thermon Leyland, FNP, for evaluation of asthma on Dupixent injections, allergic rhinitis, allergic conjunctivitis, and atopic dermatitis. Her last allergy testing was on 04/17/2021 and was positive to grass pollen, weed pollen, tree pollen, mold, dust mites, cat, dog, feathers, horse, and mouse.  Discussed the use of AI scribe software for clinical note transcription with the patient, who gave verbal consent to proceed.  History of Present Illness             Drug Allergies:  No Active Allergies  Physical Exam: LMP 12/06/2011 (Approximate)    Physical Exam  Diagnostics:    Assessment and Plan: No diagnosis found.  No orders of the defined types were placed in this encounter.   There are no Patient Instructions on file for this visit.  No follow-ups on file.    Thank you for the opportunity to care for this patient.  Please do not hesitate to contact me with questions.  Thermon Leyland, FNP [facility]

## 2023-09-09 ENCOUNTER — Other Ambulatory Visit: Payer: Self-pay

## 2023-09-09 ENCOUNTER — Ambulatory Visit: Payer: Medicaid Other | Admitting: Family Medicine

## 2023-09-09 ENCOUNTER — Encounter: Payer: Self-pay | Admitting: Family Medicine

## 2023-09-09 VITALS — BP 144/90 | HR 93 | Temp 97.3°F | Resp 12 | Ht 64.5 in | Wt 203.0 lb

## 2023-09-09 DIAGNOSIS — L309 Dermatitis, unspecified: Secondary | ICD-10-CM | POA: Diagnosis not present

## 2023-09-09 DIAGNOSIS — J302 Other seasonal allergic rhinitis: Secondary | ICD-10-CM | POA: Diagnosis not present

## 2023-09-09 DIAGNOSIS — J4489 Other specified chronic obstructive pulmonary disease: Secondary | ICD-10-CM

## 2023-09-09 DIAGNOSIS — J3089 Other allergic rhinitis: Secondary | ICD-10-CM | POA: Diagnosis not present

## 2023-09-09 DIAGNOSIS — H1010 Acute atopic conjunctivitis, unspecified eye: Secondary | ICD-10-CM

## 2023-09-09 DIAGNOSIS — H1013 Acute atopic conjunctivitis, bilateral: Secondary | ICD-10-CM | POA: Diagnosis not present

## 2023-09-09 MED ORDER — OLOPATADINE HCL 0.2 % OP SOLN: 1.0000 [drp] | Freq: Every day | OPHTHALMIC | 5 refills | Status: AC | PRN

## 2023-09-09 MED ORDER — TRIAMCINOLONE ACETONIDE 0.1 % EX CREA: 1.0000 | TOPICAL_CREAM | Freq: Two times a day (BID) | CUTANEOUS | 1 refills | Status: AC

## 2023-09-11 ENCOUNTER — Ambulatory Visit (HOSPITAL_COMMUNITY)
Admission: RE | Admit: 2023-09-11 | Discharge: 2023-09-11 | Disposition: A | Discharge status: Home / Self Care | Payer: Medicaid Other | Source: Ambulatory Visit | Attending: Family Medicine

## 2023-09-11 DIAGNOSIS — R634 Abnormal weight loss: Secondary | ICD-10-CM | POA: Insufficient documentation

## 2023-09-11 MED ORDER — IOHEXOL 300 MG/ML  SOLN
100.0000 mL | Freq: Once | INTRAMUSCULAR | Status: AC | PRN
  Administered 2023-09-11: 100 mL via INTRAVENOUS

## 2023-09-11 NOTE — Addendum Note (Signed)
Addended by: Elsworth Soho on: 09/11/2023 04:52 PM   Modules accepted: Orders

## 2023-09-18 ENCOUNTER — Other Ambulatory Visit: Payer: Self-pay

## 2023-09-18 NOTE — Progress Notes (Signed)
Specialty Pharmacy Refill Coordination Note  Savannah Pena is a 62 y.o. female contacted today regarding refills of specialty medication(s) Dupilumab   Patient requested Delivery   Delivery date: 09/29/23   Verified address: [address]   Medication will be filled on 09/28/23.

## 2023-10-14 ENCOUNTER — Other Ambulatory Visit (HOSPITAL_COMMUNITY): Payer: Self-pay

## 2023-10-20 ENCOUNTER — Other Ambulatory Visit: Payer: Self-pay

## 2023-10-26 ENCOUNTER — Other Ambulatory Visit: Payer: Self-pay

## 2023-11-12 DIAGNOSIS — T68XXXA Hypothermia, initial encounter: Secondary | ICD-10-CM | POA: Diagnosis not present

## 2023-11-12 DIAGNOSIS — T424X1A Poisoning by benzodiazepines, accidental (unintentional), initial encounter: Secondary | ICD-10-CM | POA: Diagnosis not present

## 2023-11-12 DIAGNOSIS — R404 Transient alteration of awareness: Secondary | ICD-10-CM | POA: Diagnosis not present

## 2023-11-12 DIAGNOSIS — I10 Essential (primary) hypertension: Secondary | ICD-10-CM | POA: Diagnosis not present

## 2023-11-12 DIAGNOSIS — T887XXA Unspecified adverse effect of drug or medicament, initial encounter: Secondary | ICD-10-CM | POA: Diagnosis not present

## 2023-11-12 DIAGNOSIS — T481X1A Poisoning by skeletal muscle relaxants [neuromuscular blocking agents], accidental (unintentional), initial encounter: Secondary | ICD-10-CM | POA: Diagnosis not present

## 2023-11-12 DIAGNOSIS — R45851 Suicidal ideations: Secondary | ICD-10-CM | POA: Diagnosis not present

## 2023-11-12 DIAGNOSIS — R0602 Shortness of breath: Secondary | ICD-10-CM | POA: Diagnosis not present

## 2023-11-12 DIAGNOSIS — T50904A Poisoning by unspecified drugs, medicaments and biological substances, undetermined, initial encounter: Secondary | ICD-10-CM | POA: Diagnosis not present

## 2023-11-12 DIAGNOSIS — T424X4A Poisoning by benzodiazepines, undetermined, initial encounter: Secondary | ICD-10-CM | POA: Diagnosis not present

## 2023-11-13 DIAGNOSIS — J9602 Acute respiratory failure with hypercapnia: Secondary | ICD-10-CM | POA: Diagnosis not present

## 2023-11-13 DIAGNOSIS — T50902A Poisoning by unspecified drugs, medicaments and biological substances, intentional self-harm, initial encounter: Secondary | ICD-10-CM | POA: Diagnosis not present

## 2023-11-13 DIAGNOSIS — Z4682 Encounter for fitting and adjustment of non-vascular catheter: Secondary | ICD-10-CM | POA: Diagnosis not present

## 2023-11-13 DIAGNOSIS — T50904A Poisoning by unspecified drugs, medicaments and biological substances, undetermined, initial encounter: Secondary | ICD-10-CM | POA: Diagnosis not present

## 2023-11-13 DIAGNOSIS — J9601 Acute respiratory failure with hypoxia: Secondary | ICD-10-CM | POA: Diagnosis not present

## 2023-11-13 DIAGNOSIS — R5383 Other fatigue: Secondary | ICD-10-CM | POA: Diagnosis not present

## 2023-11-13 DIAGNOSIS — G9341 Metabolic encephalopathy: Secondary | ICD-10-CM | POA: Diagnosis not present

## 2023-11-14 DIAGNOSIS — G40901 Epilepsy, unspecified, not intractable, with status epilepticus: Secondary | ICD-10-CM | POA: Diagnosis not present

## 2023-11-14 DIAGNOSIS — Z4682 Encounter for fitting and adjustment of non-vascular catheter: Secondary | ICD-10-CM | POA: Diagnosis not present

## 2023-11-15 DIAGNOSIS — I639 Cerebral infarction, unspecified: Secondary | ICD-10-CM | POA: Diagnosis not present

## 2023-11-15 DIAGNOSIS — T50904A Poisoning by unspecified drugs, medicaments and biological substances, undetermined, initial encounter: Secondary | ICD-10-CM | POA: Diagnosis not present

## 2023-11-15 DIAGNOSIS — I629 Nontraumatic intracranial hemorrhage, unspecified: Secondary | ICD-10-CM | POA: Diagnosis not present

## 2023-11-15 DIAGNOSIS — Z4682 Encounter for fitting and adjustment of non-vascular catheter: Secondary | ICD-10-CM | POA: Diagnosis not present

## 2023-11-15 DIAGNOSIS — G9340 Encephalopathy, unspecified: Secondary | ICD-10-CM | POA: Diagnosis not present

## 2023-11-15 DIAGNOSIS — F322 Major depressive disorder, single episode, severe without psychotic features: Secondary | ICD-10-CM | POA: Diagnosis not present

## 2023-11-15 DIAGNOSIS — J9690 Respiratory failure, unspecified, unspecified whether with hypoxia or hypercapnia: Secondary | ICD-10-CM | POA: Diagnosis not present

## 2023-11-15 DIAGNOSIS — R45851 Suicidal ideations: Secondary | ICD-10-CM | POA: Diagnosis not present

## 2023-11-15 DIAGNOSIS — G40901 Epilepsy, unspecified, not intractable, with status epilepticus: Secondary | ICD-10-CM | POA: Diagnosis not present

## 2023-11-16 DIAGNOSIS — G40901 Epilepsy, unspecified, not intractable, with status epilepticus: Secondary | ICD-10-CM | POA: Diagnosis not present

## 2023-11-16 DIAGNOSIS — G9340 Encephalopathy, unspecified: Secondary | ICD-10-CM | POA: Diagnosis not present

## 2023-11-16 DIAGNOSIS — J9601 Acute respiratory failure with hypoxia: Secondary | ICD-10-CM | POA: Diagnosis not present

## 2023-11-16 DIAGNOSIS — T50904A Poisoning by unspecified drugs, medicaments and biological substances, undetermined, initial encounter: Secondary | ICD-10-CM | POA: Diagnosis not present

## 2023-11-17 DIAGNOSIS — G40901 Epilepsy, unspecified, not intractable, with status epilepticus: Secondary | ICD-10-CM | POA: Diagnosis not present

## 2023-11-17 DIAGNOSIS — Z0189 Encounter for other specified special examinations: Secondary | ICD-10-CM | POA: Diagnosis not present

## 2023-11-17 DIAGNOSIS — J9601 Acute respiratory failure with hypoxia: Secondary | ICD-10-CM | POA: Diagnosis not present

## 2023-11-17 DIAGNOSIS — T50904A Poisoning by unspecified drugs, medicaments and biological substances, undetermined, initial encounter: Secondary | ICD-10-CM | POA: Diagnosis not present

## 2023-11-17 DIAGNOSIS — G9340 Encephalopathy, unspecified: Secondary | ICD-10-CM | POA: Diagnosis not present

## 2023-11-18 DIAGNOSIS — G40901 Epilepsy, unspecified, not intractable, with status epilepticus: Secondary | ICD-10-CM | POA: Diagnosis not present

## 2023-11-18 DIAGNOSIS — J9601 Acute respiratory failure with hypoxia: Secondary | ICD-10-CM | POA: Diagnosis not present

## 2023-11-18 DIAGNOSIS — G9340 Encephalopathy, unspecified: Secondary | ICD-10-CM | POA: Diagnosis not present

## 2023-11-18 DIAGNOSIS — T50904A Poisoning by unspecified drugs, medicaments and biological substances, undetermined, initial encounter: Secondary | ICD-10-CM | POA: Diagnosis not present

## 2023-11-19 DIAGNOSIS — Z4682 Encounter for fitting and adjustment of non-vascular catheter: Secondary | ICD-10-CM | POA: Diagnosis not present

## 2023-11-19 DIAGNOSIS — G9340 Encephalopathy, unspecified: Secondary | ICD-10-CM | POA: Diagnosis not present

## 2023-11-19 DIAGNOSIS — R918 Other nonspecific abnormal finding of lung field: Secondary | ICD-10-CM | POA: Diagnosis not present

## 2023-11-19 DIAGNOSIS — J9601 Acute respiratory failure with hypoxia: Secondary | ICD-10-CM | POA: Diagnosis not present

## 2023-11-19 DIAGNOSIS — Z95828 Presence of other vascular implants and grafts: Secondary | ICD-10-CM | POA: Diagnosis not present

## 2023-11-19 DIAGNOSIS — G40901 Epilepsy, unspecified, not intractable, with status epilepticus: Secondary | ICD-10-CM | POA: Diagnosis not present

## 2023-11-19 DIAGNOSIS — T50904A Poisoning by unspecified drugs, medicaments and biological substances, undetermined, initial encounter: Secondary | ICD-10-CM | POA: Diagnosis not present

## 2023-11-20 DIAGNOSIS — J9601 Acute respiratory failure with hypoxia: Secondary | ICD-10-CM | POA: Diagnosis not present

## 2023-11-20 DIAGNOSIS — T50904A Poisoning by unspecified drugs, medicaments and biological substances, undetermined, initial encounter: Secondary | ICD-10-CM | POA: Diagnosis not present

## 2023-11-20 DIAGNOSIS — G9340 Encephalopathy, unspecified: Secondary | ICD-10-CM | POA: Diagnosis not present

## 2023-11-20 DIAGNOSIS — G40901 Epilepsy, unspecified, not intractable, with status epilepticus: Secondary | ICD-10-CM | POA: Diagnosis not present

## 2023-11-21 DIAGNOSIS — G9340 Encephalopathy, unspecified: Secondary | ICD-10-CM | POA: Diagnosis not present

## 2023-11-21 DIAGNOSIS — G40901 Epilepsy, unspecified, not intractable, with status epilepticus: Secondary | ICD-10-CM | POA: Diagnosis not present

## 2023-11-21 DIAGNOSIS — T50904A Poisoning by unspecified drugs, medicaments and biological substances, undetermined, initial encounter: Secondary | ICD-10-CM | POA: Diagnosis not present

## 2023-11-21 DIAGNOSIS — J9601 Acute respiratory failure with hypoxia: Secondary | ICD-10-CM | POA: Diagnosis not present

## 2023-11-22 DIAGNOSIS — G40901 Epilepsy, unspecified, not intractable, with status epilepticus: Secondary | ICD-10-CM | POA: Diagnosis not present

## 2023-11-22 DIAGNOSIS — T50904A Poisoning by unspecified drugs, medicaments and biological substances, undetermined, initial encounter: Secondary | ICD-10-CM | POA: Diagnosis not present

## 2023-11-22 DIAGNOSIS — G9340 Encephalopathy, unspecified: Secondary | ICD-10-CM | POA: Diagnosis not present

## 2023-11-22 DIAGNOSIS — J9601 Acute respiratory failure with hypoxia: Secondary | ICD-10-CM | POA: Diagnosis not present

## 2023-11-23 DIAGNOSIS — G9340 Encephalopathy, unspecified: Secondary | ICD-10-CM | POA: Diagnosis not present

## 2023-11-23 DIAGNOSIS — T50904A Poisoning by unspecified drugs, medicaments and biological substances, undetermined, initial encounter: Secondary | ICD-10-CM | POA: Diagnosis not present

## 2023-11-23 DIAGNOSIS — R45851 Suicidal ideations: Secondary | ICD-10-CM | POA: Diagnosis not present

## 2023-11-23 DIAGNOSIS — J9601 Acute respiratory failure with hypoxia: Secondary | ICD-10-CM | POA: Diagnosis not present

## 2023-11-23 DIAGNOSIS — Z9911 Dependence on respirator [ventilator] status: Secondary | ICD-10-CM | POA: Diagnosis not present

## 2023-11-24 DIAGNOSIS — J9601 Acute respiratory failure with hypoxia: Secondary | ICD-10-CM | POA: Diagnosis not present

## 2023-11-24 DIAGNOSIS — T50904A Poisoning by unspecified drugs, medicaments and biological substances, undetermined, initial encounter: Secondary | ICD-10-CM | POA: Diagnosis not present

## 2023-11-24 DIAGNOSIS — G9340 Encephalopathy, unspecified: Secondary | ICD-10-CM | POA: Diagnosis not present

## 2023-11-24 DIAGNOSIS — Z9911 Dependence on respirator [ventilator] status: Secondary | ICD-10-CM | POA: Diagnosis not present

## 2023-11-24 DIAGNOSIS — R45851 Suicidal ideations: Secondary | ICD-10-CM | POA: Diagnosis not present

## 2023-11-25 DIAGNOSIS — J189 Pneumonia, unspecified organism: Secondary | ICD-10-CM | POA: Diagnosis not present

## 2023-11-25 DIAGNOSIS — T50904A Poisoning by unspecified drugs, medicaments and biological substances, undetermined, initial encounter: Secondary | ICD-10-CM | POA: Diagnosis not present

## 2023-11-25 DIAGNOSIS — Z9911 Dependence on respirator [ventilator] status: Secondary | ICD-10-CM | POA: Diagnosis not present

## 2023-11-25 DIAGNOSIS — J9601 Acute respiratory failure with hypoxia: Secondary | ICD-10-CM | POA: Diagnosis not present

## 2023-11-25 DIAGNOSIS — R45851 Suicidal ideations: Secondary | ICD-10-CM | POA: Diagnosis not present

## 2023-11-25 DIAGNOSIS — G9340 Encephalopathy, unspecified: Secondary | ICD-10-CM | POA: Diagnosis not present

## 2023-11-26 DIAGNOSIS — G9340 Encephalopathy, unspecified: Secondary | ICD-10-CM | POA: Diagnosis not present

## 2023-11-26 DIAGNOSIS — Z978 Presence of other specified devices: Secondary | ICD-10-CM | POA: Diagnosis not present

## 2023-11-26 DIAGNOSIS — R45851 Suicidal ideations: Secondary | ICD-10-CM | POA: Diagnosis not present

## 2023-11-26 DIAGNOSIS — F332 Major depressive disorder, recurrent severe without psychotic features: Secondary | ICD-10-CM | POA: Diagnosis not present

## 2023-11-26 DIAGNOSIS — R0602 Shortness of breath: Secondary | ICD-10-CM | POA: Diagnosis not present

## 2023-11-26 DIAGNOSIS — R918 Other nonspecific abnormal finding of lung field: Secondary | ICD-10-CM | POA: Diagnosis not present

## 2023-11-26 DIAGNOSIS — T50904A Poisoning by unspecified drugs, medicaments and biological substances, undetermined, initial encounter: Secondary | ICD-10-CM | POA: Diagnosis not present

## 2023-11-26 DIAGNOSIS — J9601 Acute respiratory failure with hypoxia: Secondary | ICD-10-CM | POA: Diagnosis not present

## 2023-11-26 DIAGNOSIS — Z9911 Dependence on respirator [ventilator] status: Secondary | ICD-10-CM | POA: Diagnosis not present

## 2023-11-27 DIAGNOSIS — T50904A Poisoning by unspecified drugs, medicaments and biological substances, undetermined, initial encounter: Secondary | ICD-10-CM | POA: Diagnosis not present

## 2023-11-27 DIAGNOSIS — Z9911 Dependence on respirator [ventilator] status: Secondary | ICD-10-CM | POA: Diagnosis not present

## 2023-11-27 DIAGNOSIS — J9601 Acute respiratory failure with hypoxia: Secondary | ICD-10-CM | POA: Diagnosis not present

## 2023-11-27 DIAGNOSIS — G9340 Encephalopathy, unspecified: Secondary | ICD-10-CM | POA: Diagnosis not present

## 2023-11-27 DIAGNOSIS — R45851 Suicidal ideations: Secondary | ICD-10-CM | POA: Diagnosis not present

## 2023-11-27 DIAGNOSIS — Z4682 Encounter for fitting and adjustment of non-vascular catheter: Secondary | ICD-10-CM | POA: Diagnosis not present

## 2023-11-28 DIAGNOSIS — R45851 Suicidal ideations: Secondary | ICD-10-CM | POA: Diagnosis not present

## 2023-11-28 DIAGNOSIS — Z9911 Dependence on respirator [ventilator] status: Secondary | ICD-10-CM | POA: Diagnosis not present

## 2023-11-28 DIAGNOSIS — G9340 Encephalopathy, unspecified: Secondary | ICD-10-CM | POA: Diagnosis not present

## 2023-11-28 DIAGNOSIS — J9601 Acute respiratory failure with hypoxia: Secondary | ICD-10-CM | POA: Diagnosis not present

## 2023-11-28 DIAGNOSIS — T50904A Poisoning by unspecified drugs, medicaments and biological substances, undetermined, initial encounter: Secondary | ICD-10-CM | POA: Diagnosis not present

## 2023-11-29 DIAGNOSIS — G9340 Encephalopathy, unspecified: Secondary | ICD-10-CM | POA: Diagnosis not present

## 2023-11-29 DIAGNOSIS — Z9911 Dependence on respirator [ventilator] status: Secondary | ICD-10-CM | POA: Diagnosis not present

## 2023-11-29 DIAGNOSIS — R45851 Suicidal ideations: Secondary | ICD-10-CM | POA: Diagnosis not present

## 2023-11-29 DIAGNOSIS — J9601 Acute respiratory failure with hypoxia: Secondary | ICD-10-CM | POA: Diagnosis not present

## 2023-11-29 DIAGNOSIS — T50904A Poisoning by unspecified drugs, medicaments and biological substances, undetermined, initial encounter: Secondary | ICD-10-CM | POA: Diagnosis not present

## 2023-11-30 DIAGNOSIS — J9601 Acute respiratory failure with hypoxia: Secondary | ICD-10-CM | POA: Diagnosis not present

## 2023-11-30 DIAGNOSIS — T50904A Poisoning by unspecified drugs, medicaments and biological substances, undetermined, initial encounter: Secondary | ICD-10-CM | POA: Diagnosis not present

## 2023-11-30 DIAGNOSIS — G9340 Encephalopathy, unspecified: Secondary | ICD-10-CM | POA: Diagnosis not present

## 2023-12-01 DIAGNOSIS — T50904A Poisoning by unspecified drugs, medicaments and biological substances, undetermined, initial encounter: Secondary | ICD-10-CM | POA: Diagnosis not present

## 2023-12-01 DIAGNOSIS — G9340 Encephalopathy, unspecified: Secondary | ICD-10-CM | POA: Diagnosis not present

## 2023-12-01 DIAGNOSIS — J9601 Acute respiratory failure with hypoxia: Secondary | ICD-10-CM | POA: Diagnosis not present

## 2023-12-02 DIAGNOSIS — T50904A Poisoning by unspecified drugs, medicaments and biological substances, undetermined, initial encounter: Secondary | ICD-10-CM | POA: Diagnosis not present

## 2023-12-02 DIAGNOSIS — R059 Cough, unspecified: Secondary | ICD-10-CM | POA: Diagnosis not present

## 2023-12-02 DIAGNOSIS — T17320A Food in larynx causing asphyxiation, initial encounter: Secondary | ICD-10-CM | POA: Diagnosis not present

## 2023-12-02 DIAGNOSIS — R1310 Dysphagia, unspecified: Secondary | ICD-10-CM | POA: Diagnosis not present

## 2023-12-03 DIAGNOSIS — G9340 Encephalopathy, unspecified: Secondary | ICD-10-CM | POA: Diagnosis not present

## 2023-12-03 DIAGNOSIS — T50904A Poisoning by unspecified drugs, medicaments and biological substances, undetermined, initial encounter: Secondary | ICD-10-CM | POA: Diagnosis not present

## 2023-12-04 DIAGNOSIS — F332 Major depressive disorder, recurrent severe without psychotic features: Secondary | ICD-10-CM | POA: Diagnosis not present

## 2023-12-04 DIAGNOSIS — T50904A Poisoning by unspecified drugs, medicaments and biological substances, undetermined, initial encounter: Secondary | ICD-10-CM | POA: Diagnosis not present

## 2023-12-05 DIAGNOSIS — T50904A Poisoning by unspecified drugs, medicaments and biological substances, undetermined, initial encounter: Secondary | ICD-10-CM | POA: Diagnosis not present

## 2023-12-06 DIAGNOSIS — T50904A Poisoning by unspecified drugs, medicaments and biological substances, undetermined, initial encounter: Secondary | ICD-10-CM | POA: Diagnosis not present

## 2023-12-07 DIAGNOSIS — T50904A Poisoning by unspecified drugs, medicaments and biological substances, undetermined, initial encounter: Secondary | ICD-10-CM | POA: Diagnosis not present

## 2023-12-08 DIAGNOSIS — T50904A Poisoning by unspecified drugs, medicaments and biological substances, undetermined, initial encounter: Secondary | ICD-10-CM | POA: Diagnosis not present

## 2023-12-08 DIAGNOSIS — R1310 Dysphagia, unspecified: Secondary | ICD-10-CM | POA: Diagnosis not present

## 2023-12-08 DIAGNOSIS — F332 Major depressive disorder, recurrent severe without psychotic features: Secondary | ICD-10-CM | POA: Diagnosis not present

## 2023-12-08 DIAGNOSIS — R410 Disorientation, unspecified: Secondary | ICD-10-CM | POA: Diagnosis not present

## 2023-12-08 DIAGNOSIS — R059 Cough, unspecified: Secondary | ICD-10-CM | POA: Diagnosis not present

## 2023-12-09 DIAGNOSIS — F332 Major depressive disorder, recurrent severe without psychotic features: Secondary | ICD-10-CM | POA: Diagnosis not present

## 2023-12-09 DIAGNOSIS — R410 Disorientation, unspecified: Secondary | ICD-10-CM | POA: Diagnosis not present

## 2023-12-09 DIAGNOSIS — T50904A Poisoning by unspecified drugs, medicaments and biological substances, undetermined, initial encounter: Secondary | ICD-10-CM | POA: Diagnosis not present

## 2023-12-10 DIAGNOSIS — T50904A Poisoning by unspecified drugs, medicaments and biological substances, undetermined, initial encounter: Secondary | ICD-10-CM | POA: Diagnosis not present

## 2023-12-10 DIAGNOSIS — R410 Disorientation, unspecified: Secondary | ICD-10-CM | POA: Diagnosis not present

## 2023-12-10 DIAGNOSIS — F332 Major depressive disorder, recurrent severe without psychotic features: Secondary | ICD-10-CM | POA: Diagnosis not present

## 2023-12-11 DIAGNOSIS — T50904A Poisoning by unspecified drugs, medicaments and biological substances, undetermined, initial encounter: Secondary | ICD-10-CM | POA: Diagnosis not present

## 2023-12-11 DIAGNOSIS — R410 Disorientation, unspecified: Secondary | ICD-10-CM | POA: Diagnosis not present

## 2023-12-11 DIAGNOSIS — F332 Major depressive disorder, recurrent severe without psychotic features: Secondary | ICD-10-CM | POA: Diagnosis not present

## 2023-12-12 DIAGNOSIS — T50904A Poisoning by unspecified drugs, medicaments and biological substances, undetermined, initial encounter: Secondary | ICD-10-CM | POA: Diagnosis not present

## 2023-12-12 DIAGNOSIS — F332 Major depressive disorder, recurrent severe without psychotic features: Secondary | ICD-10-CM | POA: Diagnosis not present

## 2023-12-12 DIAGNOSIS — R410 Disorientation, unspecified: Secondary | ICD-10-CM | POA: Diagnosis not present

## 2023-12-13 DIAGNOSIS — T50904A Poisoning by unspecified drugs, medicaments and biological substances, undetermined, initial encounter: Secondary | ICD-10-CM | POA: Diagnosis not present

## 2023-12-14 DIAGNOSIS — T50904A Poisoning by unspecified drugs, medicaments and biological substances, undetermined, initial encounter: Secondary | ICD-10-CM | POA: Diagnosis not present

## 2023-12-15 ENCOUNTER — Other Ambulatory Visit (HOSPITAL_COMMUNITY): Payer: Self-pay

## 2023-12-15 DIAGNOSIS — T50904A Poisoning by unspecified drugs, medicaments and biological substances, undetermined, initial encounter: Secondary | ICD-10-CM | POA: Diagnosis not present

## 2023-12-16 ENCOUNTER — Other Ambulatory Visit: Payer: Self-pay

## 2023-12-16 DIAGNOSIS — R059 Cough, unspecified: Secondary | ICD-10-CM | POA: Diagnosis not present

## 2023-12-16 DIAGNOSIS — T50904A Poisoning by unspecified drugs, medicaments and biological substances, undetermined, initial encounter: Secondary | ICD-10-CM | POA: Diagnosis not present

## 2023-12-16 DIAGNOSIS — R1310 Dysphagia, unspecified: Secondary | ICD-10-CM | POA: Diagnosis not present

## 2023-12-17 DIAGNOSIS — T50904A Poisoning by unspecified drugs, medicaments and biological substances, undetermined, initial encounter: Secondary | ICD-10-CM | POA: Diagnosis not present

## 2023-12-18 ENCOUNTER — Other Ambulatory Visit: Payer: Self-pay

## 2023-12-18 DIAGNOSIS — G40901 Epilepsy, unspecified, not intractable, with status epilepticus: Secondary | ICD-10-CM | POA: Diagnosis not present

## 2023-12-18 DIAGNOSIS — I10 Essential (primary) hypertension: Secondary | ICD-10-CM | POA: Diagnosis not present

## 2023-12-18 DIAGNOSIS — J4489 Other specified chronic obstructive pulmonary disease: Secondary | ICD-10-CM | POA: Diagnosis not present

## 2023-12-18 DIAGNOSIS — F332 Major depressive disorder, recurrent severe without psychotic features: Secondary | ICD-10-CM | POA: Diagnosis not present

## 2023-12-18 DIAGNOSIS — T50904A Poisoning by unspecified drugs, medicaments and biological substances, undetermined, initial encounter: Secondary | ICD-10-CM | POA: Diagnosis not present

## 2023-12-18 DIAGNOSIS — I428 Other cardiomyopathies: Secondary | ICD-10-CM | POA: Diagnosis not present

## 2023-12-18 DIAGNOSIS — F331 Major depressive disorder, recurrent, moderate: Secondary | ICD-10-CM | POA: Diagnosis not present

## 2023-12-18 DIAGNOSIS — I5181 Takotsubo syndrome: Secondary | ICD-10-CM | POA: Diagnosis not present

## 2023-12-19 DIAGNOSIS — F332 Major depressive disorder, recurrent severe without psychotic features: Secondary | ICD-10-CM | POA: Diagnosis not present

## 2023-12-20 DIAGNOSIS — I428 Other cardiomyopathies: Secondary | ICD-10-CM | POA: Diagnosis not present

## 2023-12-20 DIAGNOSIS — F332 Major depressive disorder, recurrent severe without psychotic features: Secondary | ICD-10-CM | POA: Diagnosis not present

## 2023-12-20 DIAGNOSIS — E119 Type 2 diabetes mellitus without complications: Secondary | ICD-10-CM | POA: Diagnosis not present

## 2023-12-20 DIAGNOSIS — J4489 Other specified chronic obstructive pulmonary disease: Secondary | ICD-10-CM | POA: Diagnosis not present

## 2023-12-20 DIAGNOSIS — G40901 Epilepsy, unspecified, not intractable, with status epilepticus: Secondary | ICD-10-CM | POA: Diagnosis not present

## 2023-12-20 DIAGNOSIS — I10 Essential (primary) hypertension: Secondary | ICD-10-CM | POA: Diagnosis not present

## 2023-12-21 DIAGNOSIS — F332 Major depressive disorder, recurrent severe without psychotic features: Secondary | ICD-10-CM | POA: Diagnosis not present

## 2023-12-22 DIAGNOSIS — K219 Gastro-esophageal reflux disease without esophagitis: Secondary | ICD-10-CM | POA: Diagnosis not present

## 2023-12-22 DIAGNOSIS — G40901 Epilepsy, unspecified, not intractable, with status epilepticus: Secondary | ICD-10-CM | POA: Diagnosis not present

## 2023-12-22 DIAGNOSIS — J4489 Other specified chronic obstructive pulmonary disease: Secondary | ICD-10-CM | POA: Diagnosis not present

## 2023-12-22 DIAGNOSIS — I428 Other cardiomyopathies: Secondary | ICD-10-CM | POA: Diagnosis not present

## 2023-12-22 DIAGNOSIS — E559 Vitamin D deficiency, unspecified: Secondary | ICD-10-CM | POA: Diagnosis not present

## 2023-12-22 DIAGNOSIS — I10 Essential (primary) hypertension: Secondary | ICD-10-CM | POA: Diagnosis not present

## 2023-12-22 DIAGNOSIS — F332 Major depressive disorder, recurrent severe without psychotic features: Secondary | ICD-10-CM | POA: Diagnosis not present

## 2023-12-23 DIAGNOSIS — E559 Vitamin D deficiency, unspecified: Secondary | ICD-10-CM | POA: Diagnosis not present

## 2023-12-23 DIAGNOSIS — I428 Other cardiomyopathies: Secondary | ICD-10-CM | POA: Diagnosis not present

## 2023-12-23 DIAGNOSIS — J4489 Other specified chronic obstructive pulmonary disease: Secondary | ICD-10-CM | POA: Diagnosis not present

## 2023-12-23 DIAGNOSIS — F332 Major depressive disorder, recurrent severe without psychotic features: Secondary | ICD-10-CM | POA: Diagnosis not present

## 2023-12-23 DIAGNOSIS — I10 Essential (primary) hypertension: Secondary | ICD-10-CM | POA: Diagnosis not present

## 2023-12-23 DIAGNOSIS — K219 Gastro-esophageal reflux disease without esophagitis: Secondary | ICD-10-CM | POA: Diagnosis not present

## 2023-12-23 DIAGNOSIS — G40901 Epilepsy, unspecified, not intractable, with status epilepticus: Secondary | ICD-10-CM | POA: Diagnosis not present

## 2023-12-24 ENCOUNTER — Telehealth: Payer: Self-pay

## 2023-12-24 NOTE — Transitions of Care (Post Inpatient/ED Visit) (Signed)
   12/24/2023  Name: Savannah Pena MRN: [id number] DOB: 17-Jan-1961  Today's TOC FU Call Status: Today's TOC FU Call Status:: Unsuccessful Call (1st Attempt) Unsuccessful Call (1st Attempt) Date: 12/24/23  Attempted to reach the patient regarding the most recent Inpatient/ED visit. Patient was called in an Outreach attempt to offer [facility]  30-day TOC program. Pt is eligible for program due to potential risk for readmission and/or high utilization. Unfortunately, I was not able to speak with the patient in regards to recent hospital discharge     Patient's voicemail has  a generic greeting. To maintain HIPAA compliance, left message with [facility] CM contact information only  and a request for a call back .   Follow Up Plan: Additional outreach attempts will be made to reach the patient to complete the Transitions of Care (Post Inpatient/ED visit) call.   Susa Loffler , BSN, RN [facility]   [facility]Population [facility] RN Care Manager Direct Dial [phone]  Fax: [phone] Website: [email or web address]

## 2023-12-25 ENCOUNTER — Telehealth: Payer: Self-pay

## 2023-12-25 NOTE — Transitions of Care (Post Inpatient/ED Visit) (Signed)
   12/25/2023  Name: Aariona Momon MRN: [id number] DOB: Aug 11, 1961  Today's TOC FU Call Status: Today's TOC FU Call Status:: Unsuccessful Call (2nd Attempt) Unsuccessful Call (1st Attempt) Date: 12/24/23 Unsuccessful Call (2nd Attempt) Date: 12/25/23  Attempted to reach the patient regarding the most recent Inpatient/ED visit. Patient was called in an Outreach attempt to offer VBCI  30-day TOC program. Pt is eligible for program due to potential risk for readmission and/or high utilization. Unfortunately, I was not able to speak with the patient in regards to recent hospital discharge   Left a HIPAA compliant phone message message for patient including VBCI CM contact information with request for a call back in regard to recent hospital discharge   Follow Up Plan: Additional outreach attempts will be made to reach the patient to complete the Transitions of Care (Post Inpatient/ED visit) call.   Susa Loffler , BSN, RN [facility]   VBCI-Population Health RN Care Manager Direct Dial [phone]  Fax: [phone] Website: [email or web address]

## 2023-12-28 ENCOUNTER — Telehealth: Payer: Self-pay

## 2023-12-28 NOTE — Transitions of Care (Post Inpatient/ED Visit) (Signed)
   12/28/2023  Name: Savannah Pena MRN: [id number] DOB: 1961-09-24  Today's TOC FU Call Status: Today's TOC FU Call Status:: Unsuccessful Call (3rd Attempt) Unsuccessful Call (1st Attempt) Date: 12/24/23 Unsuccessful Call (2nd Attempt) Date: 12/25/23 Unsuccessful Call (3rd Attempt) Date: 12/28/23  Attempted to reach the patient regarding the most recent Inpatient/ED visit.Patient was called in an Outreach attempt to offer VBCI  30-day TOC program. Pt is eligible for program due to potential risk for readmission and/or high utilization. Unfortunately, I was not able to speak with the patient in regards to recent hospital discharge    Patient's voicemail has  a generic greeting. To maintain HIPAA compliance, left message with VBCI CM contact information only  and a request for a call back .     Based on the VBCI program guidelines, if  we are unable to reach the patient  after 3 attempts, no additional outreach attempts will be made and the TOC follow-up will be closed Unfortunately, we have been unable to make contact with the patient for follow up.    Follow Up Plan: No further outreach attempts will be made at this time. We have been unable to contact the patient.  Susa Loffler , BSN, RN [facility]   VBCI-Population Health RN Care Manager Direct Dial [phone]  Fax: [phone] Website: [email or web address]

## 2024-01-05 DIAGNOSIS — E889 Metabolic disorder, unspecified: Secondary | ICD-10-CM | POA: Diagnosis not present

## 2024-01-05 DIAGNOSIS — R2689 Other abnormalities of gait and mobility: Secondary | ICD-10-CM | POA: Diagnosis not present

## 2024-01-05 DIAGNOSIS — N3281 Overactive bladder: Secondary | ICD-10-CM | POA: Diagnosis not present

## 2024-01-05 DIAGNOSIS — E119 Type 2 diabetes mellitus without complications: Secondary | ICD-10-CM | POA: Diagnosis not present

## 2024-01-05 DIAGNOSIS — R45851 Suicidal ideations: Secondary | ICD-10-CM | POA: Diagnosis not present

## 2024-01-05 DIAGNOSIS — F332 Major depressive disorder, recurrent severe without psychotic features: Secondary | ICD-10-CM | POA: Diagnosis not present

## 2024-01-15 ENCOUNTER — Telehealth: Payer: Self-pay

## 2024-01-15 NOTE — Telephone Encounter (Signed)
 Last filled 09/28/2023 No response to refill messages  *Sent to [facility]

## 2024-01-26 ENCOUNTER — Other Ambulatory Visit: Payer: Self-pay

## 2024-01-26 ENCOUNTER — Other Ambulatory Visit (HOSPITAL_COMMUNITY): Payer: Self-pay

## 2024-01-26 NOTE — Progress Notes (Signed)
 Specialty Pharmacy Ongoing Clinical Assessment Note  Savannah Pena is a 63 y.o. female who is being followed by the specialty pharmacy service for RxSp Asthma/COPD   Patient's specialty medication(s) reviewed today: Dupilumab (Dupixent)   Missed doses in the last 4 weeks: -- (unclear how many doses missed but patient has restarted with supply on hand)   Patient/Caregiver did not have any additional questions or concerns.   Therapeutic benefit summary: Patient is achieving benefit   Adverse events/side effects summary: No adverse events/side effects   Patient's therapy is appropriate to: Continue    Goals Addressed             This Visit's Progress    Reduce disease symptoms including coughing and shortness of breath       Patient is not on track and improving. Patient will maintain adherence. Patient missed multiple doses due to hospitalization and a move. Symptoms returned during this time but she is improving since restarting with balance she had on hand.          Follow up:  6 months  Otto Herb Specialty Pharmacist

## 2024-01-26 NOTE — Progress Notes (Signed)
 Specialty Pharmacy Refill Coordination Note  Savannah Pena is a 63 y.o. female contacted today regarding refills of specialty medication(s) Dupilumab (Dupixent)   Patient requested Delivery   Delivery date: 01/28/24   Verified address: [address]   [place] [address]   Medication will be filled on 01/27/24.

## 2024-01-27 ENCOUNTER — Other Ambulatory Visit: Payer: Self-pay

## 2024-02-15 DIAGNOSIS — G40909 Epilepsy, unspecified, not intractable, without status epilepticus: Secondary | ICD-10-CM | POA: Diagnosis not present

## 2024-02-15 DIAGNOSIS — R32 Unspecified urinary incontinence: Secondary | ICD-10-CM | POA: Diagnosis not present

## 2024-02-15 DIAGNOSIS — F329 Major depressive disorder, single episode, unspecified: Secondary | ICD-10-CM | POA: Diagnosis not present

## 2024-02-15 DIAGNOSIS — E119 Type 2 diabetes mellitus without complications: Secondary | ICD-10-CM | POA: Diagnosis not present

## 2024-02-15 DIAGNOSIS — T447X2A Poisoning by beta-adrenoreceptor antagonists, intentional self-harm, initial encounter: Secondary | ICD-10-CM | POA: Diagnosis not present

## 2024-02-15 DIAGNOSIS — I10 Essential (primary) hypertension: Secondary | ICD-10-CM | POA: Diagnosis not present

## 2024-02-15 DIAGNOSIS — Z7985 Long-term (current) use of injectable non-insulin antidiabetic drugs: Secondary | ICD-10-CM | POA: Diagnosis not present

## 2024-02-15 DIAGNOSIS — G928 Other toxic encephalopathy: Secondary | ICD-10-CM | POA: Diagnosis not present

## 2024-02-15 DIAGNOSIS — J9811 Atelectasis: Secondary | ICD-10-CM | POA: Diagnosis not present

## 2024-02-15 DIAGNOSIS — Z79899 Other long term (current) drug therapy: Secondary | ICD-10-CM | POA: Diagnosis not present

## 2024-02-15 DIAGNOSIS — B9620 Unspecified Escherichia coli [E. coli] as the cause of diseases classified elsewhere: Secondary | ICD-10-CM | POA: Diagnosis not present

## 2024-02-15 DIAGNOSIS — R578 Other shock: Secondary | ICD-10-CM | POA: Diagnosis not present

## 2024-02-15 DIAGNOSIS — N390 Urinary tract infection, site not specified: Secondary | ICD-10-CM | POA: Diagnosis not present

## 2024-02-15 DIAGNOSIS — J9601 Acute respiratory failure with hypoxia: Secondary | ICD-10-CM | POA: Diagnosis not present

## 2024-02-15 DIAGNOSIS — J4489 Other specified chronic obstructive pulmonary disease: Secondary | ICD-10-CM | POA: Diagnosis not present

## 2024-02-15 DIAGNOSIS — I428 Other cardiomyopathies: Secondary | ICD-10-CM | POA: Diagnosis not present

## 2024-02-15 DIAGNOSIS — E538 Deficiency of other specified B group vitamins: Secondary | ICD-10-CM | POA: Diagnosis not present

## 2024-02-15 DIAGNOSIS — E872 Acidosis, unspecified: Secondary | ICD-10-CM | POA: Diagnosis not present

## 2024-02-16 DIAGNOSIS — J9811 Atelectasis: Secondary | ICD-10-CM | POA: Diagnosis not present

## 2024-02-16 DIAGNOSIS — R918 Other nonspecific abnormal finding of lung field: Secondary | ICD-10-CM | POA: Diagnosis not present

## 2024-02-16 DIAGNOSIS — T447X2A Poisoning by beta-adrenoreceptor antagonists, intentional self-harm, initial encounter: Secondary | ICD-10-CM | POA: Diagnosis not present

## 2024-02-16 DIAGNOSIS — R579 Shock, unspecified: Secondary | ICD-10-CM | POA: Diagnosis not present

## 2024-02-16 DIAGNOSIS — R188 Other ascites: Secondary | ICD-10-CM | POA: Diagnosis not present

## 2024-02-17 DIAGNOSIS — Z4659 Encounter for fitting and adjustment of other gastrointestinal appliance and device: Secondary | ICD-10-CM | POA: Diagnosis not present

## 2024-02-17 DIAGNOSIS — T447X2A Poisoning by beta-adrenoreceptor antagonists, intentional self-harm, initial encounter: Secondary | ICD-10-CM | POA: Diagnosis not present

## 2024-02-18 ENCOUNTER — Other Ambulatory Visit: Payer: Self-pay

## 2024-02-18 DIAGNOSIS — T447X2A Poisoning by beta-adrenoreceptor antagonists, intentional self-harm, initial encounter: Secondary | ICD-10-CM | POA: Diagnosis not present

## 2024-02-18 DIAGNOSIS — R001 Bradycardia, unspecified: Secondary | ICD-10-CM | POA: Diagnosis not present

## 2024-02-19 DIAGNOSIS — T447X2A Poisoning by beta-adrenoreceptor antagonists, intentional self-harm, initial encounter: Secondary | ICD-10-CM | POA: Diagnosis not present

## 2024-02-21 DIAGNOSIS — T447X2A Poisoning by beta-adrenoreceptor antagonists, intentional self-harm, initial encounter: Secondary | ICD-10-CM | POA: Diagnosis not present

## 2024-02-22 DIAGNOSIS — T50902A Poisoning by unspecified drugs, medicaments and biological substances, intentional self-harm, initial encounter: Secondary | ICD-10-CM | POA: Diagnosis not present

## 2024-02-22 DIAGNOSIS — T447X2A Poisoning by beta-adrenoreceptor antagonists, intentional self-harm, initial encounter: Secondary | ICD-10-CM | POA: Diagnosis not present

## 2024-02-23 DIAGNOSIS — T447X2A Poisoning by beta-adrenoreceptor antagonists, intentional self-harm, initial encounter: Secondary | ICD-10-CM | POA: Diagnosis not present

## 2024-02-24 DIAGNOSIS — T447X2A Poisoning by beta-adrenoreceptor antagonists, intentional self-harm, initial encounter: Secondary | ICD-10-CM | POA: Diagnosis not present

## 2024-02-25 DIAGNOSIS — T447X2A Poisoning by beta-adrenoreceptor antagonists, intentional self-harm, initial encounter: Secondary | ICD-10-CM | POA: Diagnosis not present

## 2024-02-26 DIAGNOSIS — R45851 Suicidal ideations: Secondary | ICD-10-CM | POA: Diagnosis not present

## 2024-02-26 DIAGNOSIS — G40909 Epilepsy, unspecified, not intractable, without status epilepticus: Secondary | ICD-10-CM | POA: Diagnosis not present

## 2024-02-26 DIAGNOSIS — J302 Other seasonal allergic rhinitis: Secondary | ICD-10-CM | POA: Diagnosis not present

## 2024-02-26 DIAGNOSIS — E119 Type 2 diabetes mellitus without complications: Secondary | ICD-10-CM | POA: Diagnosis not present

## 2024-02-26 DIAGNOSIS — I10 Essential (primary) hypertension: Secondary | ICD-10-CM | POA: Diagnosis not present

## 2024-02-26 DIAGNOSIS — F322 Major depressive disorder, single episode, severe without psychotic features: Secondary | ICD-10-CM | POA: Diagnosis not present

## 2024-02-27 DIAGNOSIS — I4510 Unspecified right bundle-branch block: Secondary | ICD-10-CM | POA: Diagnosis not present

## 2024-02-27 DIAGNOSIS — F322 Major depressive disorder, single episode, severe without psychotic features: Secondary | ICD-10-CM | POA: Diagnosis not present

## 2024-02-27 DIAGNOSIS — R45851 Suicidal ideations: Secondary | ICD-10-CM | POA: Diagnosis not present

## 2024-02-27 DIAGNOSIS — I454 Nonspecific intraventricular block: Secondary | ICD-10-CM | POA: Diagnosis not present

## 2024-02-27 DIAGNOSIS — I499 Cardiac arrhythmia, unspecified: Secondary | ICD-10-CM | POA: Diagnosis not present

## 2024-03-10 DIAGNOSIS — F332 Major depressive disorder, recurrent severe without psychotic features: Secondary | ICD-10-CM | POA: Diagnosis not present

## 2024-03-11 ENCOUNTER — Ambulatory Visit: Payer: Medicaid Other | Admitting: Allergy & Immunology

## 2024-03-18 ENCOUNTER — Other Ambulatory Visit (HOSPITAL_COMMUNITY): Payer: Self-pay

## 2024-03-18 ENCOUNTER — Other Ambulatory Visit: Payer: Self-pay | Admitting: Family Medicine

## 2024-03-22 ENCOUNTER — Other Ambulatory Visit (HOSPITAL_COMMUNITY): Payer: Self-pay

## 2024-03-22 ENCOUNTER — Other Ambulatory Visit: Payer: Self-pay

## 2024-03-22 ENCOUNTER — Telehealth: Payer: Self-pay

## 2024-03-22 MED ORDER — DUPIXENT 300 MG/2ML ~~LOC~~ SOAJ
300.0000 mg | SUBCUTANEOUS | 11 refills | Status: AC
  Filled 2024-03-22 (×3): qty 4, 28d supply, fill #0

## 2024-03-22 NOTE — Progress Notes (Signed)
 Specialty Pharmacy Refill Coordination Note  Savannah Pena is a 63 y.o. female contacted today regarding refills of specialty medication(s) Dupilumab  (Dupixent )   Patient requested Delivery   Delivery date: 03/30/24   Verified address: [address]  [place] [address]   Medication will be filled on 03/29/24.   This medication requires a new prior authorization, and is currently being processed by the PA team. Specialty Pharmacy team will contact patient if any delays arise.

## 2024-03-22 NOTE — Progress Notes (Signed)
 Message sent to Tammy about PA needed.

## 2024-03-22 NOTE — Telephone Encounter (Signed)
 Per call center Dupixent  is needing a PA at this time, thank you!

## 2024-03-23 DIAGNOSIS — F332 Major depressive disorder, recurrent severe without psychotic features: Secondary | ICD-10-CM | POA: Diagnosis not present

## 2024-03-23 DIAGNOSIS — F4310 Post-traumatic stress disorder, unspecified: Secondary | ICD-10-CM | POA: Diagnosis not present

## 2024-03-24 ENCOUNTER — Other Ambulatory Visit: Payer: Self-pay

## 2024-03-24 ENCOUNTER — Other Ambulatory Visit (HOSPITAL_COMMUNITY): Payer: Self-pay

## 2024-03-24 DIAGNOSIS — F332 Major depressive disorder, recurrent severe without psychotic features: Secondary | ICD-10-CM | POA: Diagnosis not present

## 2024-03-24 NOTE — Telephone Encounter (Signed)
 Patient needs MD appt for reapproval. Had one 5/2 no show

## 2024-03-24 NOTE — Telephone Encounter (Signed)
 Patient needs appt for re-approval, no show for last appt 05/02

## 2024-03-24 NOTE — Progress Notes (Signed)
 Clinical Intervention Note  Clinical Intervention Notes: Patient reports initiating Lexapro. No DDIs identified with Dupixent .   Clinical Intervention Outcomes: Prevention of an adverse drug event   Rena Carnes Specialty Pharmacist

## 2024-03-24 NOTE — Progress Notes (Signed)
 LVM. Patient needs an appointment for PA re-approval per Tammy.

## 2024-03-29 ENCOUNTER — Other Ambulatory Visit (HOSPITAL_COMMUNITY): Payer: Self-pay

## 2024-03-29 ENCOUNTER — Other Ambulatory Visit: Payer: Self-pay

## 2024-04-07 ENCOUNTER — Other Ambulatory Visit (HOSPITAL_COMMUNITY): Payer: Self-pay

## 2024-04-08 DIAGNOSIS — F332 Major depressive disorder, recurrent severe without psychotic features: Secondary | ICD-10-CM | POA: Diagnosis not present

## 2024-04-13 ENCOUNTER — Other Ambulatory Visit: Payer: Self-pay

## 2024-04-18 DIAGNOSIS — G40909 Epilepsy, unspecified, not intractable, without status epilepticus: Secondary | ICD-10-CM | POA: Diagnosis not present

## 2024-04-18 DIAGNOSIS — Z9151 Personal history of suicidal behavior: Secondary | ICD-10-CM | POA: Diagnosis not present

## 2024-04-19 ENCOUNTER — Other Ambulatory Visit: Payer: Self-pay

## 2024-04-20 DIAGNOSIS — I10 Essential (primary) hypertension: Secondary | ICD-10-CM | POA: Diagnosis not present

## 2024-04-20 DIAGNOSIS — R569 Unspecified convulsions: Secondary | ICD-10-CM | POA: Diagnosis not present

## 2024-04-20 DIAGNOSIS — G40909 Epilepsy, unspecified, not intractable, without status epilepticus: Secondary | ICD-10-CM | POA: Diagnosis not present

## 2024-04-20 DIAGNOSIS — J3489 Other specified disorders of nose and nasal sinuses: Secondary | ICD-10-CM | POA: Diagnosis not present

## 2024-04-20 DIAGNOSIS — N3090 Cystitis, unspecified without hematuria: Secondary | ICD-10-CM | POA: Diagnosis not present

## 2024-04-26 DIAGNOSIS — F332 Major depressive disorder, recurrent severe without psychotic features: Secondary | ICD-10-CM | POA: Diagnosis not present

## 2024-07-01 ENCOUNTER — Other Ambulatory Visit: Payer: Self-pay

## 2024-07-01 NOTE — Progress Notes (Signed)
 Disenrolling- Dupixent  last filled 3.19.25 (156 days ago) and medication requires a PA. Patient needs office visit before [facility] will renew PA and pharmacy made multiple attempts to update patient and have received no response.
